# Patient Record
Sex: Female | Born: 1945 | Race: White | Hispanic: No | Marital: Married | Smoking: Current every day smoker
Health system: Southern US, Community
[De-identification: ages and names within clinical notes are randomized; demographics above are authoritative.]

## PROBLEM LIST (undated history)

## (undated) DIAGNOSIS — K589 Irritable bowel syndrome without diarrhea: Secondary | ICD-10-CM

## (undated) DIAGNOSIS — K635 Polyp of colon: Secondary | ICD-10-CM

## (undated) DIAGNOSIS — K279 Peptic ulcer, site unspecified, unspecified as acute or chronic, without hemorrhage or perforation: Secondary | ICD-10-CM

## (undated) DIAGNOSIS — K759 Inflammatory liver disease, unspecified: Secondary | ICD-10-CM

## (undated) DIAGNOSIS — Z972 Presence of dental prosthetic device (complete) (partial): Secondary | ICD-10-CM

## (undated) DIAGNOSIS — N184 Chronic kidney disease, stage 4 (severe): Secondary | ICD-10-CM

## (undated) DIAGNOSIS — I10 Essential (primary) hypertension: Secondary | ICD-10-CM

## (undated) DIAGNOSIS — K219 Gastro-esophageal reflux disease without esophagitis: Secondary | ICD-10-CM

## (undated) DIAGNOSIS — E119 Type 2 diabetes mellitus without complications: Secondary | ICD-10-CM

## (undated) DIAGNOSIS — J449 Chronic obstructive pulmonary disease, unspecified: Secondary | ICD-10-CM

## (undated) DIAGNOSIS — R42 Dizziness and giddiness: Secondary | ICD-10-CM

## (undated) DIAGNOSIS — R519 Headache, unspecified: Secondary | ICD-10-CM

## (undated) DIAGNOSIS — Z720 Tobacco use: Secondary | ICD-10-CM

## (undated) DIAGNOSIS — E785 Hyperlipidemia, unspecified: Secondary | ICD-10-CM

## (undated) DIAGNOSIS — R51 Headache: Secondary | ICD-10-CM

## (undated) HISTORY — PX: COLONOSCOPY: SHX174

## (undated) HISTORY — PX: ESOPHAGOGASTRODUODENOSCOPY: SHX1529

## (undated) HISTORY — PX: OTHER SURGICAL HISTORY: SHX169

## (undated) HISTORY — PX: ABDOMINAL HYSTERECTOMY: SHX81

---

## 2005-01-08 ENCOUNTER — Ambulatory Visit: Payer: Self-pay | Admitting: Family Medicine

## 2006-05-11 ENCOUNTER — Emergency Department: Payer: Self-pay | Admitting: Emergency Medicine

## 2006-05-11 ENCOUNTER — Other Ambulatory Visit: Payer: Self-pay

## 2006-05-13 ENCOUNTER — Ambulatory Visit: Payer: Self-pay | Admitting: Emergency Medicine

## 2006-05-19 ENCOUNTER — Ambulatory Visit: Payer: Self-pay | Admitting: Gastroenterology

## 2006-07-03 ENCOUNTER — Ambulatory Visit: Payer: Self-pay | Admitting: Gastroenterology

## 2006-07-16 ENCOUNTER — Ambulatory Visit: Payer: Self-pay | Admitting: Gastroenterology

## 2006-07-20 ENCOUNTER — Ambulatory Visit: Payer: Self-pay | Admitting: Gastroenterology

## 2007-06-16 ENCOUNTER — Ambulatory Visit: Payer: Self-pay | Admitting: Family Medicine

## 2008-03-22 ENCOUNTER — Ambulatory Visit: Payer: Self-pay | Admitting: Family Medicine

## 2008-06-20 ENCOUNTER — Ambulatory Visit: Payer: Self-pay | Admitting: Family Medicine

## 2009-09-13 ENCOUNTER — Ambulatory Visit: Payer: Self-pay | Admitting: Family Medicine

## 2010-04-22 ENCOUNTER — Emergency Department: Payer: Self-pay | Admitting: Emergency Medicine

## 2010-09-17 ENCOUNTER — Ambulatory Visit: Payer: Self-pay | Admitting: Family Medicine

## 2011-03-12 ENCOUNTER — Ambulatory Visit: Payer: Self-pay | Admitting: Internal Medicine

## 2011-04-06 ENCOUNTER — Ambulatory Visit: Payer: Self-pay | Admitting: Internal Medicine

## 2011-07-23 ENCOUNTER — Ambulatory Visit: Payer: Self-pay | Admitting: Internal Medicine

## 2011-11-20 ENCOUNTER — Ambulatory Visit: Payer: Self-pay | Admitting: Internal Medicine

## 2012-11-23 ENCOUNTER — Ambulatory Visit: Payer: Self-pay | Admitting: Internal Medicine

## 2012-12-14 ENCOUNTER — Ambulatory Visit: Payer: Self-pay | Admitting: Internal Medicine

## 2013-03-24 ENCOUNTER — Ambulatory Visit: Payer: Self-pay | Admitting: Internal Medicine

## 2013-06-21 DIAGNOSIS — R42 Dizziness and giddiness: Secondary | ICD-10-CM | POA: Insufficient documentation

## 2014-01-03 ENCOUNTER — Ambulatory Visit: Payer: Self-pay | Admitting: Family Medicine

## 2014-06-20 ENCOUNTER — Ambulatory Visit: Payer: Self-pay | Admitting: Gastroenterology

## 2014-06-22 LAB — PATHOLOGY REPORT

## 2015-01-05 ENCOUNTER — Ambulatory Visit
Admit: 2015-01-05 | Disposition: A | Discharge status: Home / Self Care | Payer: Self-pay | Attending: Family Medicine | Admitting: Family Medicine

## 2016-01-15 ENCOUNTER — Other Ambulatory Visit: Payer: Self-pay | Admitting: Family Medicine

## 2016-01-15 DIAGNOSIS — Z1231 Encounter for screening mammogram for malignant neoplasm of breast: Secondary | ICD-10-CM

## 2016-01-18 ENCOUNTER — Ambulatory Visit
Admission: RE | Admit: 2016-01-18 | Discharge: 2016-01-18 | Disposition: A | Discharge status: Home / Self Care | Payer: Medicare Other | Source: Ambulatory Visit | Attending: Family Medicine | Admitting: Family Medicine

## 2016-01-18 ENCOUNTER — Other Ambulatory Visit: Payer: Self-pay | Admitting: Family Medicine

## 2016-01-18 DIAGNOSIS — Z1231 Encounter for screening mammogram for malignant neoplasm of breast: Secondary | ICD-10-CM

## 2017-01-06 ENCOUNTER — Other Ambulatory Visit: Payer: Self-pay | Admitting: Family Medicine

## 2017-01-06 DIAGNOSIS — Z1231 Encounter for screening mammogram for malignant neoplasm of breast: Secondary | ICD-10-CM

## 2017-01-15 ENCOUNTER — Telehealth: Payer: Self-pay | Admitting: *Deleted

## 2017-01-15 DIAGNOSIS — Z87891 Personal history of nicotine dependence: Secondary | ICD-10-CM

## 2017-01-15 NOTE — Telephone Encounter (Signed)
Received referral for initial lung cancer screening scan. Contacted patient and obtained smoking history,(current, 33 pack year) as well as answering questions related to screening process. Patient denies signs of lung cancer such as weight loss or hemoptysis. Patient denies comorbidity that would prevent curative treatment if lung cancer were found. Patient is tentatively scheduled for shared decision making visit and CT scan on 01/20/17, pending insurance approval from business office.

## 2017-01-20 ENCOUNTER — Inpatient Hospital Stay: Payer: Medicare Other | Attending: Oncology | Admitting: Oncology

## 2017-01-20 ENCOUNTER — Ambulatory Visit
Admission: RE | Admit: 2017-01-20 | Discharge: 2017-01-20 | Disposition: A | Discharge status: Home / Self Care | Payer: Medicare Other | Source: Ambulatory Visit | Attending: Oncology | Admitting: Oncology

## 2017-01-20 DIAGNOSIS — Z87891 Personal history of nicotine dependence: Secondary | ICD-10-CM

## 2017-01-20 DIAGNOSIS — I2510 Atherosclerotic heart disease of native coronary artery without angina pectoris: Secondary | ICD-10-CM | POA: Insufficient documentation

## 2017-01-20 DIAGNOSIS — Z122 Encounter for screening for malignant neoplasm of respiratory organs: Secondary | ICD-10-CM

## 2017-01-20 DIAGNOSIS — I700 Atherosclerosis of aorta: Secondary | ICD-10-CM | POA: Insufficient documentation

## 2017-01-23 ENCOUNTER — Telehealth: Payer: Self-pay | Admitting: *Deleted

## 2017-01-23 NOTE — Telephone Encounter (Signed)
Notified patient of LDCT lung cancer screening results with recommendation for 12 month follow up imaging. Also notified of incidental finding noted below and encouraged to discuss with PCP who will be sent a copy of the report. Patient verbalizes understanding.   IMPRESSION: 1. Lung-RADS Category 2S, benign appearance or behavior. Continue annual screening with low-dose chest CT without contrast in 12 months. 2. The "S" modifier above refers to potentially clinically significant non lung cancer related findings. Specifically, there is aortic atherosclerosis, in addition to left main and 3 vessel coronary artery disease. Assessment for potential risk factor modification, dietary therapy or pharmacologic therapy may be warranted, if clinically indicated.

## 2017-01-25 DIAGNOSIS — Z87891 Personal history of nicotine dependence: Secondary | ICD-10-CM | POA: Insufficient documentation

## 2017-01-25 NOTE — Progress Notes (Signed)
In accordance with CMS guidelines, patient has met eligibility criteria including age, absence of signs or symptoms of lung cancer.  Social History  Substance Use Topics  . Smoking status: Not on file  . Smokeless tobacco: Not on file  . Alcohol use Not on file     A shared decision-making session was conducted prior to the performance of CT scan. This includes one or more decision aids, includes benefits and harms of screening, follow-up diagnostic testing, over-diagnosis, false positive rate, and total radiation exposure.  Counseling on the importance of adherence to annual lung cancer LDCT screening, impact of co-morbidities, and ability or willingness to undergo diagnosis and treatment is imperative for compliance of the program.  Counseling on the importance of continued smoking cessation for former smokers; the importance of smoking cessation for current smokers, and information about tobacco cessation interventions have been given to patient including [place] [facility] [facility] and 1800 quit [place] programs.  Written order for lung cancer screening with LDCT has been given to the patient and any and all questions have been answered to the best of my abilities.   Yearly follow up will be coordinated by Shawn Perkins, Thoracic Navigator.  

## 2017-01-28 ENCOUNTER — Ambulatory Visit
Admission: RE | Admit: 2017-01-28 | Discharge: 2017-01-28 | Disposition: A | Discharge status: Home / Self Care | Payer: Medicare Other | Source: Ambulatory Visit | Attending: Family Medicine | Admitting: Family Medicine

## 2017-01-28 DIAGNOSIS — Z1231 Encounter for screening mammogram for malignant neoplasm of breast: Secondary | ICD-10-CM | POA: Diagnosis not present

## 2017-10-29 ENCOUNTER — Other Ambulatory Visit
Admission: RE | Admit: 2017-10-29 | Discharge: 2017-10-29 | Disposition: A | Discharge status: Home / Self Care | Payer: Medicare Other | Source: Ambulatory Visit | Attending: Nephrology | Admitting: Nephrology

## 2017-10-29 DIAGNOSIS — N179 Acute kidney failure, unspecified: Secondary | ICD-10-CM | POA: Diagnosis present

## 2017-10-29 LAB — COMPREHENSIVE METABOLIC PANEL
ALBUMIN: 2.9 g/dL — AB (ref 3.5–5.0)
ALK PHOS: 60 U/L (ref 38–126)
ALT: 17 U/L (ref 14–54)
AST: 21 U/L (ref 15–41)
Anion gap: 9 (ref 5–15)
BILIRUBIN TOTAL: 0.3 mg/dL (ref 0.3–1.2)
BUN: 24 mg/dL — ABNORMAL HIGH (ref 6–20)
CALCIUM: 8.9 mg/dL (ref 8.9–10.3)
CO2: 26 mmol/L (ref 22–32)
CREATININE: 1.61 mg/dL — AB (ref 0.44–1.00)
Chloride: 106 mmol/L (ref 101–111)
GFR calc Af Amer: 36 mL/min — ABNORMAL LOW (ref >60)
GFR calc non Af Amer: 31 mL/min — ABNORMAL LOW (ref >60)
GLUCOSE: 131 mg/dL — AB (ref 65–99)
Potassium: 3.4 mmol/L — ABNORMAL LOW (ref 3.5–5.1)
SODIUM: 141 mmol/L (ref 135–145)
TOTAL PROTEIN: 6.6 g/dL (ref 6.5–8.1)

## 2017-10-29 LAB — CK: Total CK: 33 U/L — ABNORMAL LOW (ref 38–234)

## 2017-10-29 LAB — CBC
HCT: 28.7 % — ABNORMAL LOW (ref 35.0–47.0)
Hemoglobin: 10.1 g/dL — ABNORMAL LOW (ref 12.0–16.0)
MCH: 30.7 pg (ref 26.0–34.0)
MCHC: 35.2 g/dL (ref 32.0–36.0)
MCV: 87.1 fL (ref 80.0–100.0)
PLATELETS: 425 10*3/uL (ref 150–440)
RBC: 3.3 MIL/uL — ABNORMAL LOW (ref 3.80–5.20)
RDW: 12.8 % (ref 11.5–14.5)
WBC: 6.8 10*3/uL (ref 3.6–11.0)

## 2017-10-29 LAB — PHOSPHORUS: PHOSPHORUS: 4.7 mg/dL — AB (ref 2.5–4.6)

## 2017-11-05 ENCOUNTER — Ambulatory Visit
Admission: RE | Admit: 2017-11-05 | Discharge: 2017-11-05 | Disposition: A | Discharge status: Home / Self Care | Payer: Medicare Other | Source: Ambulatory Visit | Attending: Gastroenterology | Admitting: Gastroenterology

## 2017-11-05 ENCOUNTER — Encounter: Payer: Self-pay | Admitting: Anesthesiology

## 2017-11-05 ENCOUNTER — Ambulatory Visit: Payer: Medicare Other | Admitting: Anesthesiology

## 2017-11-05 ENCOUNTER — Encounter
Admission: RE | Disposition: A | Discharge status: Home / Self Care | Payer: Self-pay | Source: Ambulatory Visit | Attending: Gastroenterology

## 2017-11-05 DIAGNOSIS — R634 Abnormal weight loss: Secondary | ICD-10-CM | POA: Diagnosis not present

## 2017-11-05 DIAGNOSIS — K589 Irritable bowel syndrome without diarrhea: Secondary | ICD-10-CM | POA: Diagnosis not present

## 2017-11-05 DIAGNOSIS — E785 Hyperlipidemia, unspecified: Secondary | ICD-10-CM | POA: Insufficient documentation

## 2017-11-05 DIAGNOSIS — K3189 Other diseases of stomach and duodenum: Secondary | ICD-10-CM | POA: Insufficient documentation

## 2017-11-05 DIAGNOSIS — Z8711 Personal history of peptic ulcer disease: Secondary | ICD-10-CM | POA: Diagnosis not present

## 2017-11-05 DIAGNOSIS — E119 Type 2 diabetes mellitus without complications: Secondary | ICD-10-CM | POA: Insufficient documentation

## 2017-11-05 DIAGNOSIS — Z7982 Long term (current) use of aspirin: Secondary | ICD-10-CM | POA: Diagnosis not present

## 2017-11-05 DIAGNOSIS — Q438 Other specified congenital malformations of intestine: Secondary | ICD-10-CM | POA: Insufficient documentation

## 2017-11-05 DIAGNOSIS — I10 Essential (primary) hypertension: Secondary | ICD-10-CM | POA: Insufficient documentation

## 2017-11-05 DIAGNOSIS — D122 Benign neoplasm of ascending colon: Secondary | ICD-10-CM | POA: Diagnosis not present

## 2017-11-05 DIAGNOSIS — J449 Chronic obstructive pulmonary disease, unspecified: Secondary | ICD-10-CM | POA: Diagnosis not present

## 2017-11-05 DIAGNOSIS — R1084 Generalized abdominal pain: Secondary | ICD-10-CM | POA: Diagnosis present

## 2017-11-05 DIAGNOSIS — K219 Gastro-esophageal reflux disease without esophagitis: Secondary | ICD-10-CM | POA: Insufficient documentation

## 2017-11-05 DIAGNOSIS — K210 Gastro-esophageal reflux disease with esophagitis: Secondary | ICD-10-CM | POA: Insufficient documentation

## 2017-11-05 DIAGNOSIS — Z79899 Other long term (current) drug therapy: Secondary | ICD-10-CM | POA: Insufficient documentation

## 2017-11-05 DIAGNOSIS — Z86010 Personal history of colonic polyps: Secondary | ICD-10-CM | POA: Insufficient documentation

## 2017-11-05 DIAGNOSIS — Z880 Allergy status to penicillin: Secondary | ICD-10-CM | POA: Diagnosis not present

## 2017-11-05 DIAGNOSIS — Z882 Allergy status to sulfonamides status: Secondary | ICD-10-CM | POA: Diagnosis not present

## 2017-11-05 DIAGNOSIS — K2950 Unspecified chronic gastritis without bleeding: Secondary | ICD-10-CM | POA: Diagnosis not present

## 2017-11-05 HISTORY — DX: Type 2 diabetes mellitus without complications: E11.9

## 2017-11-05 HISTORY — DX: Headache: R51

## 2017-11-05 HISTORY — DX: Headache, unspecified: R51.9

## 2017-11-05 HISTORY — DX: Hyperlipidemia, unspecified: E78.5

## 2017-11-05 HISTORY — DX: Polyp of colon: K63.5

## 2017-11-05 HISTORY — DX: Irritable bowel syndrome, unspecified: K58.9

## 2017-11-05 HISTORY — PX: ESOPHAGOGASTRODUODENOSCOPY (EGD) WITH PROPOFOL: SHX5813

## 2017-11-05 HISTORY — DX: Chronic obstructive pulmonary disease, unspecified: J44.9

## 2017-11-05 HISTORY — DX: Essential (primary) hypertension: I10

## 2017-11-05 HISTORY — DX: Peptic ulcer, site unspecified, unspecified as acute or chronic, without hemorrhage or perforation: K27.9

## 2017-11-05 HISTORY — PX: COLONOSCOPY WITH PROPOFOL: SHX5780

## 2017-11-05 HISTORY — DX: Tobacco use: Z72.0

## 2017-11-05 LAB — KOH PREP

## 2017-11-05 LAB — GLUCOSE, CAPILLARY: Glucose-Capillary: 123 mg/dL — ABNORMAL HIGH (ref 65–99)

## 2017-11-05 SURGERY — COLONOSCOPY WITH PROPOFOL
Anesthesia: General

## 2017-11-05 MED ORDER — LIDOCAINE HCL (PF) 2 % IJ SOLN
INTRAMUSCULAR | Status: AC
  Filled 2017-11-05: qty 10

## 2017-11-05 MED ORDER — FENTANYL CITRATE (PF) 100 MCG/2ML IJ SOLN
INTRAMUSCULAR | Status: DC | PRN
  Administered 2017-11-05: 50 ug via INTRAVENOUS

## 2017-11-05 MED ORDER — EPHEDRINE SULFATE 50 MG/ML IJ SOLN
INTRAMUSCULAR | Status: DC | PRN
  Administered 2017-11-05 (×2): 5 mg via INTRAVENOUS

## 2017-11-05 MED ORDER — MIDAZOLAM HCL 2 MG/2ML IJ SOLN
INTRAMUSCULAR | Status: AC
  Filled 2017-11-05: qty 2

## 2017-11-05 MED ORDER — LIDOCAINE HCL (PF) 1 % IJ SOLN
INTRAMUSCULAR | Status: AC
  Administered 2017-11-05: 0.3 mL via INTRADERMAL
  Filled 2017-11-05: qty 2

## 2017-11-05 MED ORDER — PROPOFOL 500 MG/50ML IV EMUL
INTRAVENOUS | Status: DC | PRN
  Administered 2017-11-05: 100 ug/kg/min via INTRAVENOUS

## 2017-11-05 MED ORDER — SODIUM CHLORIDE 0.9 % IV SOLN: INTRAVENOUS | Status: DC

## 2017-11-05 MED ORDER — FENTANYL CITRATE (PF) 100 MCG/2ML IJ SOLN
INTRAMUSCULAR | Status: AC
  Filled 2017-11-05: qty 2

## 2017-11-05 MED ORDER — LIDOCAINE HCL (PF) 1 % IJ SOLN
2.0000 mL | Freq: Once | INTRAMUSCULAR | Status: AC
  Administered 2017-11-05: 0.3 mL via INTRADERMAL

## 2017-11-05 MED ORDER — PROPOFOL 500 MG/50ML IV EMUL
INTRAVENOUS | Status: AC
  Filled 2017-11-05: qty 50

## 2017-11-05 MED ORDER — LIDOCAINE HCL (CARDIAC) 20 MG/ML IV SOLN
INTRAVENOUS | Status: DC | PRN
  Administered 2017-11-05: 30 mg via INTRAVENOUS

## 2017-11-05 MED ORDER — SODIUM CHLORIDE 0.9 % IV SOLN
INTRAVENOUS | Status: DC
  Administered 2017-11-05: 1000 mL via INTRAVENOUS

## 2017-11-05 MED ORDER — PROPOFOL 10 MG/ML IV BOLUS
INTRAVENOUS | Status: AC
  Filled 2017-11-05: qty 20

## 2017-11-05 MED ORDER — MIDAZOLAM HCL 2 MG/2ML IJ SOLN
INTRAMUSCULAR | Status: DC | PRN
  Administered 2017-11-05: 1 mg via INTRAVENOUS

## 2017-11-05 NOTE — Anesthesia Procedure Notes (Signed)
Performed by: Cook-Martin, Janely Gullickson Pre-anesthesia Checklist: Patient identified, Emergency Drugs available, Suction available, Patient being monitored and Timeout performed Patient Re-evaluated:Patient Re-evaluated prior to induction Oxygen Delivery Method: Nasal cannula Preoxygenation: Pre-oxygenation with 100% oxygen Induction Type: IV induction Airway Equipment and Method: Bite block Placement Confirmation: positive ETCO2 and CO2 detector       

## 2017-11-05 NOTE — Anesthesia Postprocedure Evaluation (Signed)
Anesthesia Post Note  Patient: Donna Brennan  Procedure(s) Performed: COLONOSCOPY WITH PROPOFOL (N/A ) ESOPHAGOGASTRODUODENOSCOPY (EGD) WITH PROPOFOL (N/A )  Patient location during evaluation: Endoscopy Anesthesia Type: General Level of consciousness: awake and alert Pain management: pain level controlled Vital Signs Assessment: post-procedure vital signs reviewed and stable Respiratory status: spontaneous breathing, nonlabored ventilation, respiratory function stable and patient connected to nasal cannula oxygen Cardiovascular status: blood pressure returned to baseline and stable Postop Assessment: no apparent nausea or vomiting Anesthetic complications: no     Last Vitals:  Vitals:   11/05/17 1150 11/05/17 1200  BP: 129/63 111/60  Pulse: 83 73  Resp: 19 14  Temp:    SpO2: 97% 100%    Last Pain:  Vitals:   11/05/17 1130  TempSrc: Tympanic                 Martha Clan

## 2017-11-05 NOTE — Op Note (Addendum)
[facility] Gastroenterology Patient Name: Donna Brennan Procedure Date: 11/05/2017 10:24 AM MRN: [id number] Account #: [id number] Date of Birth: 02-26-1946 Admit Type: Outpatient Age: 72 Room: [facility] ENDO ROOM 1 Gender: Female Note Status: Finalized Procedure:            Upper GI endoscopy Indications:          Epigastric abdominal pain, Abdominal pain in the right                        upper quadrant, Weight loss Providers:            Lollie Sails, MD Referring MD:         Gayland Curry MD, MD (Referring MD) Medicines:            Monitored Anesthesia Care Complications:        No immediate complications. Procedure:            Pre-Anesthesia Assessment:                       - ASA Grade Assessment: III - A patient with severe                        systemic disease.                       After obtaining informed consent, the endoscope was                        passed under direct vision. Throughout the procedure,                        the patient's blood pressure, pulse, and oxygen                        saturations were monitored continuously. The                        Colonoscope was introduced through the mouth, and                        advanced to the third part of duodenum. The upper GI                        endoscopy was accomplished without difficulty. The                        patient tolerated the procedure well. Findings:      The examined esophagus was normal.      patulous GE junction      The Z-line was regular. Biopsies were taken with a cold forceps for       histology.      The entire examined stomach was normal. Biopsies were taken with a cold       forceps for histology.      The cardia and gastric fundus were normal on retroflexion.      Diffuse mild mucosal variance characterized by smoothness was found in       the entire duodenum. Biopsies were taken with a cold forceps for       histology.      Patchy candidiasis was  found in the upper third  of the esophagus and in       the middle third of the esophagus. Cells for cytology were obtained by       brushing. Impression:           - Normal esophagus.                       - Z-line regular. Biopsied.                       - Normal stomach. Biopsied.                       - Mucosal variant in the duodenum. Biopsied. Recommendation:       - Discharge patient to home.                       - Advance diet as tolerated.                       - Await pathology results.                       - Perform a HIDA/CCK (hepatobiliary iminodiacetic acid)                        scan at appointment to be scheduled.                       - Perform a RUQ ultrasound at appointment to be                        scheduled. Procedure Code(s):    --- Professional ---                       [id number], Esophagogastroduodenoscopy, flexible, transoral;                        with biopsy, single or multiple Diagnosis Code(s):    --- Professional ---                       K31.89, Other diseases of stomach and duodenum                       R10.13, Epigastric pain                       R10.11, Right upper quadrant pain                       R63.4, Abnormal weight loss CPT copyright 2016 American Medical Association. All rights reserved. The codes documented in this report are preliminary and upon coder review may  be revised to meet current compliance requirements. Lollie Sails, MD 11/05/2017 10:57:55 AM This report has been signed electronically. Number of Addenda: 0 Note Initiated On: 11/05/2017 10:24 AM      [facility]

## 2017-11-05 NOTE — Op Note (Signed)
[facility] Gastroenterology Patient Name: Donna Brennan Procedure Date: 11/05/2017 10:23 AM MRN: [id number] Account #: [id number] Date of Birth: May 30, 1946 Admit Type: Outpatient Age: 72 Room: [facility] ENDO ROOM 1 Gender: Female Note Status: Finalized Procedure:            Colonoscopy Indications:          Epigastric abdominal pain, Generalized abdominal pain,                        Abdominal pain in the right upper quadrant Providers:            Lollie Sails, MD Referring MD:         Gayland Curry MD, MD (Referring MD) Medicines:            Monitored Anesthesia Care Complications:        No immediate complications. Procedure:            Pre-Anesthesia Assessment:                       - ASA Grade Assessment: III - A patient with severe                        systemic disease.                       After obtaining informed consent, the colonoscope was                        passed under direct vision. Throughout the procedure,                        the patient's blood pressure, pulse, and oxygen                        saturations were monitored continuously. The                        Colonoscope was introduced through the anus and                        advanced to the the cecum, identified by appendiceal                        orifice and ileocecal valve. The colonoscopy was                        performed with moderate difficulty due to significant                        looping. Successful completion of the procedure was                        aided by changing the patient to a supine position,                        changing the patient to a prone position and using                        manual pressure. The patient tolerated the procedure  well. The quality of the bowel preparation was good. Findings:      A 3 mm polyp was found in the proximal ascending colon. The polyp was       sessile. The polyp was removed with a cold  biopsy forceps. Resection and       retrieval were complete.      Biopsies for histology were taken with a cold forceps from the       transverse colon, descending colon and sigmoid colon for evaluation of       microscopic colitis.      The digital rectal exam was normal. Impression:           - One 3 mm polyp in the proximal ascending colon,                        removed with a cold biopsy forceps. Resected and                        retrieved.                       - Biopsies were taken with a cold forceps from the                        transverse colon, descending colon and sigmoid colon                        for evaluation of microscopic colitis. Recommendation:       - Discharge patient to home. Procedure Code(s):    --- Professional ---                       [phone], Colonoscopy, flexible; with biopsy, single or                        multiple Diagnosis Code(s):    --- Professional ---                       D12.2, Benign neoplasm of ascending colon                       R10.13, Epigastric pain                       R10.84, Generalized abdominal pain                       R10.11, Right upper quadrant pain CPT copyright 2016 American Medical Association. All rights reserved. The codes documented in this report are preliminary and upon coder review may  be revised to meet current compliance requirements. Lollie Sails, MD 11/05/2017 11:33:13 AM This report has been signed electronically. Number of Addenda: 0 Note Initiated On: 11/05/2017 10:23 AM Scope Withdrawal Time: 0 hours 6 minutes 36 seconds  Total Procedure Duration: 0 hours 29 minutes 17 seconds       [facility]

## 2017-11-05 NOTE — Anesthesia Post-op Follow-up Note (Signed)
Anesthesia QCDR form completed.        

## 2017-11-05 NOTE — Transfer of Care (Signed)
Immediate Anesthesia Transfer of Care Note  Patient: Donna Brennan  Procedure(s) Performed: COLONOSCOPY WITH PROPOFOL (N/A ) ESOPHAGOGASTRODUODENOSCOPY (EGD) WITH PROPOFOL (N/A )  Patient Location: PACU  Anesthesia Type:General  Level of Consciousness: awake and sedated  Airway & Oxygen Therapy: Patient Spontanous Breathing and Patient connected to nasal cannula oxygen  Post-op Assessment: Report given to RN and Post -op Vital signs reviewed and stable  Post vital signs: Reviewed and stable  Last Vitals:  Vitals:   11/05/17 0936  BP: (!) 147/67  Pulse: 75  Resp: 18  Temp: 36.6 C  SpO2: 100%    Last Pain:  Vitals:   11/05/17 0936  TempSrc: Tympanic         Complications: No apparent anesthesia complications

## 2017-11-05 NOTE — H&P (Signed)
Outpatient short stay form Pre-procedure 11/05/2017 10:28 AM Donna Sails MD  Primary Physician: Gayland Curry, MD  Reason for visit: EGD and colonoscopy  History of present illness: Patient is a 72 year old female presenting today as above.  She has had issues with poor appetite and weight loss since this past November.  She has not seen any blood in the stool or black bowel movements.  There is been no dysphagia or odynophagia.  There has been issues with epigastric pain and nausea but no vomiting.  Does have a personal history of adenomatous colon polyps.  He is presenting today as above.  Tolerated prep well.  She takes 81 mg aspirin daily but no other aspirin products or blood thinning agents.    Current Facility-Administered Medications:  .  0.9 %  sodium chloride infusion, , Intravenous, Continuous, Donna Sails, MD, Last Rate: 20 mL/hr at 11/05/17 0951, 1,000 mL at 11/05/17 0951 .  0.9 %  sodium chloride infusion, , Intravenous, Continuous, Donna Sails, MD  Facility-Administered Medications Ordered in Other Encounters:  .  lidocaine (cardiac) 100 mg/49ml (XYLOCAINE) 20 MG/ML injection 2%, , , Anesthesia Intra-op, Cook-Martin, Cindy, 30 mg at 11/05/17 1026  Medications Prior to Admission  Medication Sig Dispense Refill Last Dose  . albuterol (PROVENTIL HFA;VENTOLIN HFA) 108 (90 Base) MCG/ACT inhaler Inhale into the lungs every 6 (six) hours as needed for wheezing or shortness of breath.     Marland Kitchen aspirin EC 81 MG tablet Take 81 mg by mouth daily.   10/30/2017  . ezetimibe (ZETIA) 10 MG tablet Take 10 mg by mouth daily.     . Fluticasone-Salmeterol (ADVAIR) 250-50 MCG/DOSE AEPB Inhale 1 puff into the lungs 2 (two) times daily.     . furosemide (LASIX) 20 MG tablet Take 20 mg by mouth.     Marland Kitchen ibuprofen (ADVIL,MOTRIN) 200 MG tablet Take 200 mg by mouth every 6 (six) hours as needed.     . insulin glargine (LANTUS) 100 UNIT/ML injection Inject 20 Units into the skin at  bedtime.     . Ipratropium-Albuterol (COMBIVENT) 20-100 MCG/ACT AERS respimat Inhale 1 puff into the lungs every 4 (four) hours as needed for wheezing.     Marland Kitchen lisinopril (PRINIVIL,ZESTRIL) 5 MG tablet Take 5 mg by mouth daily.     . meloxicam (MOBIC) 7.5 MG tablet Take 7.5 mg by mouth daily.     . metFORMIN (GLUCOPHAGE-XR) 500 MG 24 hr tablet Take 500 mg by mouth 3 (three) times daily.     . Multiple Vitamin (MULTIVITAMIN) tablet Take 1 tablet by mouth daily.     . pantoprazole (PROTONIX) 40 MG tablet Take 40 mg by mouth 2 (two) times daily before a meal.     . pramoxine-hydrocortisone (ANALPRAM HC) cream Apply topically 3 (three) times daily.     . rosuvastatin (CRESTOR) 40 MG tablet Take 40 mg by mouth daily.     Marland Kitchen tiotropium (SPIRIVA) 18 MCG inhalation capsule Place 18 mcg into inhaler and inhale daily.        Allergies  Allergen Reactions  . Penicillins   . Sulfa Antibiotics      Past Medical History:  Diagnosis Date  . Colon polyps   . COPD (chronic obstructive pulmonary disease) ([place])   . Diabetes mellitus without complication ([place])   . Headache   . Hyperlipidemia   . Hypertension   . IBS (irritable bowel syndrome)   . Peptic ulcer   . Tobacco abuse  Review of systems:      Physical Exam    Heart and lungs: Regular rate and rhythm without rub or gallop, lungs are bilaterally clear.    HEENT: Normocephalic atraumatic eyes are anicteric    Other:    Pertinant exam for procedure: Soft tender to palpation throughout the abdomen more so noted in the epigastric and right upper quadrant region.  No apparent masses or rebound.  Bowel sounds are positive and normoactive.    Planned proceedures: EGD, colonoscopy and indicated procedures. I have discussed the risks benefits and complications of procedures to include not limited to bleeding, infection, perforation and the risk of sedation and the patient wishes to proceed.    Donna Sails,  MD Gastroenterology 11/05/2017  10:28 AM

## 2017-11-05 NOTE — Anesthesia Preprocedure Evaluation (Signed)
Anesthesia Evaluation  Patient identified by MRN, date of birth, ID band Patient awake    Reviewed: Allergy & Precautions, H&P , NPO status , Patient's Chart, lab work & pertinent test results, reviewed documented beta blocker date and time   History of Anesthesia Complications Negative for: history of anesthetic complications  Airway Mallampati: I  TM Distance: >3 FB Neck ROM: full    Dental  (+) Partial Upper, Partial Lower   Pulmonary shortness of breath and with exertion, neg sleep apnea, COPD,  COPD inhaler, neg recent URI, Current Smoker,           Cardiovascular Exercise Tolerance: Good hypertension, (-) angina(-) CAD, (-) Past MI, (-) Cardiac Stents and (-) CABG (-) dysrhythmias (-) Valvular Problems/Murmurs     Neuro/Psych negative neurological ROS  negative psych ROS   GI/Hepatic Neg liver ROS, PUD, GERD  ,  Endo/Other  diabetes  Renal/GU negative Renal ROS  negative genitourinary   Musculoskeletal   Abdominal   Peds  Hematology negative hematology ROS (+)   Anesthesia Other Findings Past Medical History: No date: Colon polyps No date: COPD (chronic obstructive pulmonary disease) (HCC) No date: Diabetes mellitus without complication (HCC) No date: Headache No date: Hyperlipidemia No date: Hypertension No date: IBS (irritable bowel syndrome) No date: Peptic ulcer No date: Tobacco abuse   Reproductive/Obstetrics negative OB ROS                             Anesthesia Physical Anesthesia Plan  ASA: III  Anesthesia Plan: General   Post-op Pain Management:    Induction: Intravenous  PONV Risk Score and Plan: 2 and Propofol infusion  Airway Management Planned: Nasal Cannula  Additional Equipment:   Intra-op Plan:   Post-operative Plan:   Informed Consent: I have reviewed the patients History and Physical, chart, labs and discussed the procedure including the risks,  benefits and alternatives for the proposed anesthesia with the patient or authorized representative who has indicated his/her understanding and acceptance.   Dental Advisory Given  Plan Discussed with: Anesthesiologist, CRNA and Surgeon  Anesthesia Plan Comments:         Anesthesia Quick Evaluation

## 2017-11-09 ENCOUNTER — Other Ambulatory Visit: Payer: Self-pay | Admitting: Gastroenterology

## 2017-11-09 DIAGNOSIS — D649 Anemia, unspecified: Secondary | ICD-10-CM

## 2017-11-09 DIAGNOSIS — R1013 Epigastric pain: Secondary | ICD-10-CM

## 2017-11-09 DIAGNOSIS — R1011 Right upper quadrant pain: Secondary | ICD-10-CM

## 2017-11-09 LAB — SURGICAL PATHOLOGY

## 2017-11-18 ENCOUNTER — Encounter
Admission: RE | Admit: 2017-11-18 | Discharge: 2017-11-18 | Disposition: A | Discharge status: Home / Self Care | Payer: Medicare Other | Source: Ambulatory Visit | Attending: Gastroenterology | Admitting: Gastroenterology

## 2017-11-18 ENCOUNTER — Ambulatory Visit
Admission: RE | Admit: 2017-11-18 | Discharge: 2017-11-18 | Disposition: A | Discharge status: Home / Self Care | Payer: Medicare Other | Source: Ambulatory Visit | Attending: Gastroenterology | Admitting: Gastroenterology

## 2017-11-18 DIAGNOSIS — R1013 Epigastric pain: Secondary | ICD-10-CM | POA: Insufficient documentation

## 2017-11-18 DIAGNOSIS — R1011 Right upper quadrant pain: Secondary | ICD-10-CM | POA: Insufficient documentation

## 2017-11-18 DIAGNOSIS — K7689 Other specified diseases of liver: Secondary | ICD-10-CM | POA: Diagnosis not present

## 2017-11-18 DIAGNOSIS — D649 Anemia, unspecified: Secondary | ICD-10-CM | POA: Diagnosis not present

## 2017-11-18 MED ORDER — TECHNETIUM TC 99M MEBROFENIN IV KIT
5.4410 | PACK | Freq: Once | INTRAVENOUS | Status: AC | PRN
  Administered 2017-11-18: 5.441 via INTRAVENOUS

## 2017-11-20 ENCOUNTER — Other Ambulatory Visit: Payer: Self-pay | Admitting: Specialist

## 2017-11-20 DIAGNOSIS — R918 Other nonspecific abnormal finding of lung field: Secondary | ICD-10-CM

## 2017-12-23 ENCOUNTER — Other Ambulatory Visit: Payer: Self-pay | Admitting: Family Medicine

## 2017-12-23 DIAGNOSIS — Z1231 Encounter for screening mammogram for malignant neoplasm of breast: Secondary | ICD-10-CM

## 2017-12-30 ENCOUNTER — Other Ambulatory Visit: Payer: Self-pay | Admitting: Gastroenterology

## 2017-12-30 DIAGNOSIS — Z8639 Personal history of other endocrine, nutritional and metabolic disease: Secondary | ICD-10-CM

## 2017-12-30 DIAGNOSIS — R140 Abdominal distension (gaseous): Secondary | ICD-10-CM

## 2018-01-06 DIAGNOSIS — E538 Deficiency of other specified B group vitamins: Secondary | ICD-10-CM | POA: Insufficient documentation

## 2018-01-09 ENCOUNTER — Ambulatory Visit: Payer: Medicare Other

## 2018-01-15 ENCOUNTER — Telehealth: Payer: Self-pay | Admitting: *Deleted

## 2018-01-15 ENCOUNTER — Encounter
Admission: RE | Admit: 2018-01-15 | Discharge: 2018-01-15 | Disposition: A | Discharge status: Home / Self Care | Payer: Medicare Other | Source: Ambulatory Visit | Attending: Gastroenterology | Admitting: Gastroenterology

## 2018-01-15 DIAGNOSIS — R1010 Upper abdominal pain, unspecified: Secondary | ICD-10-CM | POA: Diagnosis present

## 2018-01-15 DIAGNOSIS — Z122 Encounter for screening for malignant neoplasm of respiratory organs: Secondary | ICD-10-CM

## 2018-01-15 DIAGNOSIS — Z87891 Personal history of nicotine dependence: Secondary | ICD-10-CM

## 2018-01-15 DIAGNOSIS — R140 Abdominal distension (gaseous): Secondary | ICD-10-CM

## 2018-01-15 DIAGNOSIS — Z8639 Personal history of other endocrine, nutritional and metabolic disease: Secondary | ICD-10-CM

## 2018-01-15 MED ORDER — TECHNETIUM TC 99M SULFUR COLLOID
2.5000 | Freq: Once | INTRAVENOUS | Status: AC | PRN
  Administered 2018-01-15: 2.5 via INTRAVENOUS

## 2018-01-15 NOTE — Telephone Encounter (Signed)
Left message for patient to notify them that it is time to schedule annual low dose lung cancer screening CT scan. Instructed patient to call back to verify information prior to the scan being scheduled.  

## 2018-01-15 NOTE — Telephone Encounter (Signed)
Notified patient that annual lung cancer screening low dose CT scan is due currently or will be in near future. Confirmed that patient is within the age range of 55-77, and asymptomatic, (no signs or symptoms of lung cancer). Patient denies illness that would prevent curative treatment for lung cancer if found. Verified smoking history, (current, 33.25 pack year). The shared decision making visit was done 01/20/17. Patient is agreeable for CT scan being scheduled.

## 2018-01-20 ENCOUNTER — Other Ambulatory Visit: Payer: Self-pay | Admitting: Gastroenterology

## 2018-01-20 DIAGNOSIS — R1011 Right upper quadrant pain: Secondary | ICD-10-CM

## 2018-01-21 ENCOUNTER — Ambulatory Visit
Admission: RE | Admit: 2018-01-21 | Discharge: 2018-01-21 | Disposition: A | Discharge status: Home / Self Care | Payer: Medicare Other | Source: Ambulatory Visit | Attending: Nurse Practitioner | Admitting: Nurse Practitioner

## 2018-01-21 DIAGNOSIS — Z122 Encounter for screening for malignant neoplasm of respiratory organs: Secondary | ICD-10-CM | POA: Diagnosis present

## 2018-01-21 DIAGNOSIS — J439 Emphysema, unspecified: Secondary | ICD-10-CM | POA: Diagnosis not present

## 2018-01-21 DIAGNOSIS — I700 Atherosclerosis of aorta: Secondary | ICD-10-CM | POA: Diagnosis not present

## 2018-01-21 DIAGNOSIS — Z87891 Personal history of nicotine dependence: Secondary | ICD-10-CM | POA: Diagnosis present

## 2018-01-21 DIAGNOSIS — I2510 Atherosclerotic heart disease of native coronary artery without angina pectoris: Secondary | ICD-10-CM | POA: Diagnosis not present

## 2018-01-25 ENCOUNTER — Encounter: Payer: Self-pay | Admitting: *Deleted

## 2018-01-26 ENCOUNTER — Ambulatory Visit
Admission: RE | Admit: 2018-01-26 | Discharge: 2018-01-26 | Disposition: A | Discharge status: Home / Self Care | Payer: Medicare Other | Source: Ambulatory Visit | Attending: Gastroenterology | Admitting: Gastroenterology

## 2018-01-26 DIAGNOSIS — R1011 Right upper quadrant pain: Secondary | ICD-10-CM

## 2018-01-26 DIAGNOSIS — K769 Liver disease, unspecified: Secondary | ICD-10-CM | POA: Insufficient documentation

## 2018-01-26 DIAGNOSIS — R1010 Upper abdominal pain, unspecified: Secondary | ICD-10-CM | POA: Diagnosis present

## 2018-01-26 MED ORDER — IOHEXOL 300 MG/ML  SOLN
75.0000 mL | Freq: Once | INTRAMUSCULAR | Status: AC | PRN
  Administered 2018-01-26: 75 mL via INTRAVENOUS

## 2018-02-01 ENCOUNTER — Ambulatory Visit
Admission: RE | Admit: 2018-02-01 | Discharge: 2018-02-01 | Disposition: A | Discharge status: Home / Self Care | Payer: Medicare Other | Source: Ambulatory Visit | Attending: Family Medicine | Admitting: Family Medicine

## 2018-02-01 DIAGNOSIS — Z1231 Encounter for screening mammogram for malignant neoplasm of breast: Secondary | ICD-10-CM | POA: Diagnosis present

## 2018-09-15 DIAGNOSIS — I700 Atherosclerosis of aorta: Secondary | ICD-10-CM | POA: Insufficient documentation

## 2018-09-15 DIAGNOSIS — E278 Other specified disorders of adrenal gland: Secondary | ICD-10-CM | POA: Insufficient documentation

## 2018-09-27 ENCOUNTER — Encounter (INDEPENDENT_AMBULATORY_CARE_PROVIDER_SITE_OTHER): Payer: Self-pay

## 2018-09-27 ENCOUNTER — Encounter: Payer: Self-pay | Admitting: *Deleted

## 2018-09-27 ENCOUNTER — Telehealth: Payer: Self-pay | Admitting: Internal Medicine

## 2018-09-27 ENCOUNTER — Ambulatory Visit
Admission: RE | Admit: 2018-09-27 | Discharge: 2018-09-27 | Disposition: A | Discharge status: Home / Self Care | Payer: Medicare Other | Attending: Internal Medicine | Admitting: Internal Medicine

## 2018-09-27 ENCOUNTER — Inpatient Hospital Stay: Payer: Medicare Other | Attending: Internal Medicine | Admitting: Internal Medicine

## 2018-09-27 ENCOUNTER — Encounter: Payer: Self-pay | Admitting: Internal Medicine

## 2018-09-27 ENCOUNTER — Ambulatory Visit
Admission: RE | Admit: 2018-09-27 | Discharge: 2018-09-27 | Disposition: A | Discharge status: Home / Self Care | Payer: Medicare Other | Source: Ambulatory Visit | Attending: Internal Medicine | Admitting: Internal Medicine

## 2018-09-27 DIAGNOSIS — D472 Monoclonal gammopathy: Secondary | ICD-10-CM | POA: Insufficient documentation

## 2018-09-27 DIAGNOSIS — Z720 Tobacco use: Secondary | ICD-10-CM | POA: Diagnosis not present

## 2018-09-27 DIAGNOSIS — R634 Abnormal weight loss: Secondary | ICD-10-CM | POA: Insufficient documentation

## 2018-09-27 DIAGNOSIS — E119 Type 2 diabetes mellitus without complications: Secondary | ICD-10-CM | POA: Insufficient documentation

## 2018-09-27 DIAGNOSIS — G8929 Other chronic pain: Secondary | ICD-10-CM | POA: Diagnosis not present

## 2018-09-27 DIAGNOSIS — D649 Anemia, unspecified: Secondary | ICD-10-CM | POA: Insufficient documentation

## 2018-09-27 DIAGNOSIS — C9000 Multiple myeloma not having achieved remission: Secondary | ICD-10-CM | POA: Insufficient documentation

## 2018-09-27 DIAGNOSIS — M545 Low back pain: Secondary | ICD-10-CM | POA: Diagnosis not present

## 2018-09-27 DIAGNOSIS — J449 Chronic obstructive pulmonary disease, unspecified: Secondary | ICD-10-CM | POA: Diagnosis not present

## 2018-09-27 DIAGNOSIS — Z794 Long term (current) use of insulin: Secondary | ICD-10-CM | POA: Insufficient documentation

## 2018-09-27 DIAGNOSIS — N289 Disorder of kidney and ureter, unspecified: Secondary | ICD-10-CM | POA: Diagnosis not present

## 2018-09-27 NOTE — Progress Notes (Signed)
[place] NOTE  Patient Care Team: Adin Hector, MD as PCP - General (Internal Medicine)  CHIEF COMPLAINTS/PURPOSE OF CONSULTATION:  Abnormal monoclonal work-up  #December 2019 [PCP]-M protein serum negative; urine/kappa lambda light chain ratio-slightly abnormal [mild anemia hemoglobin 11; normal iron studies chronic kidney disease creatinine 1.2]  # DM- on insulin-good control; COPD/active smoker; status post colonoscopy Dr. Gustavo Lah; mammogram-2019 normal  No history exists.     HISTORY OF PRESENTING ILLNESS:  Donna Brennan 72 y.o.  female with a longstanding history of smoking/active smoker has been referred to [place] for further evaluation and recommendations for abnormal monoclonal work-up.  Patient states approximately in January 2018-she was noted to have acute renal insufficiency with a creatinine up to 3.5; for which she was evaluated by Dr. Candiss Norse.  As per the daughter patient was taken off her ACE inhibitor which seems to have improved her kidney function.  However recently when she was restarted back on ACE inhibitor her renal function worsened-and also noted to have worsening clinical symptoms of fatigue/back hurting abdominal discomfort.  She is currently off lisinopril.  Recent labs at PCPs office-hemoglobin 11.2/white count platelets normal; iron studies normal; slightly abnormal urine/serum kappa lambda light chain ratio.  Creatinine slightly elevated 1.2; normal calcium.  Of note patient had lost about 10 pounds in the last few years.  She denies any tingling or numbness.  No nausea no vomiting.  But feels poorly.  Review of Systems  Constitutional: Positive for malaise/fatigue and weight loss. Negative for chills, diaphoresis and fever.  HENT: Negative for nosebleeds and sore throat.   Eyes: Negative for double vision.  Respiratory: Negative for cough, hemoptysis, sputum production, shortness of breath and wheezing.   Cardiovascular:  Negative for chest pain, palpitations, orthopnea and leg swelling.  Gastrointestinal: Negative for abdominal pain, blood in stool, constipation, diarrhea, heartburn, melena, nausea and vomiting.  Genitourinary: Negative for dysuria, frequency and urgency.  Musculoskeletal: Positive for back pain and joint pain.  Skin: Negative.  Negative for itching and rash.  Neurological: Negative for dizziness, tingling, focal weakness, weakness and headaches.  Endo/Heme/Allergies: Does not bruise/bleed easily.  Psychiatric/Behavioral: Negative for depression. The patient is not nervous/anxious and does not have insomnia.      MEDICAL HISTORY:  Past Medical History:  Diagnosis Date  . Colon polyps   . COPD (chronic obstructive pulmonary disease) ([place])   . Diabetes mellitus without complication ([place])   . Headache   . Hyperlipidemia   . Hypertension   . IBS (irritable bowel syndrome)   . Peptic ulcer   . Tobacco abuse     SURGICAL HISTORY: Past Surgical History:  Procedure Laterality Date  . ABDOMINAL HYSTERECTOMY    . carbuncle removal     urethra  . COLONOSCOPY    . COLONOSCOPY WITH PROPOFOL N/A 11/05/2017   Procedure: COLONOSCOPY WITH PROPOFOL;  Surgeon: Lollie Sails, MD;  Location[facility] ENDOSCOPY;  Service: Endoscopy;  Laterality: N/A;  . ESOPHAGOGASTRODUODENOSCOPY    . ESOPHAGOGASTRODUODENOSCOPY (EGD) WITH PROPOFOL N/A 11/05/2017   Procedure: ESOPHAGOGASTRODUODENOSCOPY (EGD) WITH PROPOFOL;  Surgeon: Lollie Sails, MD;  Location: [facility] ENDOSCOPY;  Service: Endoscopy;  Laterality: N/A;    SOCIAL HISTORY: Social History   Socioeconomic History  . Marital status: Married    Spouse name: Not on file  . Number of children: Not on file  . Years of education: Not on file  . Highest education level: Not on file  Occupational History  . Not on  file  Social Needs  . Financial resource strain: Not on file  . Food insecurity:    Worry: Not on file    Inability: Not on file  .  Transportation needs:    Medical: Not on file    Non-medical: Not on file  Tobacco Use  . Smoking status: Current Some Day Smoker  Substance and Sexual Activity  . Alcohol use: No    Frequency: Never  . Drug use: No  . Sexual activity: Not on file  Lifestyle  . Physical activity:    Days per week: Not on file    Minutes per session: Not on file  . Stress: Not on file  Relationships  . Social connections:    Talks on phone: Not on file    Gets together: Not on file    Attends religious service: Not on file    Active member of club or organization: Not on file    Attends meetings of clubs or organizations: Not on file    Relationship status: Not on file  . Intimate partner violence:    Fear of current or ex partner: Not on file    Emotionally abused: Not on file    Physically abused: Not on file    Forced sexual activity: Not on file  Other Topics Concern  . 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 [place]      FAMILY HISTORY: History reviewed. No pertinent family history.  ALLERGIES:  is allergic to penicillins and sulfa antibiotics.  MEDICATIONS:  Current Outpatient Medications  Medication Sig Dispense Refill  . albuterol (PROVENTIL HFA;VENTOLIN HFA) 108 (90 Base) MCG/ACT inhaler Inhale into the lungs every 6 (six) hours as needed for wheezing or shortness of breath.    Marland Kitchen aspirin EC 81 MG tablet Take 81 mg by mouth daily.    Marland Kitchen ezetimibe (ZETIA) 10 MG tablet Take 10 mg by mouth daily.    . Fluticasone-Salmeterol (ADVAIR) 250-50 MCG/DOSE AEPB Inhale 1 puff into the lungs 2 (two) times daily.    . furosemide (LASIX) 20 MG tablet Take 20 mg by mouth.    Marland Kitchen ibuprofen (ADVIL,MOTRIN) 200 MG tablet Take 200 mg by mouth every 6 (six) hours as needed.    . insulin glargine (LANTUS) 100 UNIT/ML injection Inject 20 Units into the skin at bedtime.    . Ipratropium-Albuterol  (COMBIVENT) 20-100 MCG/ACT AERS respimat Inhale 1 puff into the lungs every 4 (four) hours as needed for wheezing.    Marland Kitchen lisinopril (PRINIVIL,ZESTRIL) 5 MG tablet Take 5 mg by mouth daily.    . meloxicam (MOBIC) 7.5 MG tablet Take 7.5 mg by mouth daily.    . metFORMIN (GLUCOPHAGE-XR) 500 MG 24 hr tablet Take 500 mg by mouth 3 (three) times daily.    . Multiple Vitamin (MULTIVITAMIN) tablet Take 1 tablet by mouth daily.    . pantoprazole (PROTONIX) 40 MG tablet Take 40 mg by mouth 2 (two) times daily before a meal.    . pramoxine-hydrocortisone (ANALPRAM HC) cream Apply topically 3 (three) times daily.    . rosuvastatin (CRESTOR) 40 MG tablet Take 40 mg by mouth daily.    Marland Kitchen tiotropium (SPIRIVA) 18 MCG inhalation capsule Place 18 mcg into inhaler and inhale daily.     No current facility-administered medications for this visit.       Marland Kitchen  PHYSICAL EXAMINATION: ECOG PERFORMANCE STATUS: 1 - Symptomatic  but completely ambulatory  Vitals:   09/27/18 1149  BP: 115/79  Pulse: 98  Temp: 97.9 F (36.6 C)   Filed Weights   09/27/18 1149  Weight: 131 lb (59.4 kg)    Physical Exam  Constitutional: She is oriented to person, place, and time and well-developed, well-nourished, and in no distress.  HENT:  Head: Normocephalic and atraumatic.  Mouth/Throat: Oropharynx is clear and moist. No oropharyngeal exudate.  Eyes: Pupils are equal, round, and reactive to light.  Neck: Normal range of motion. Neck supple.  Cardiovascular: Normal rate and regular rhythm.  Pulmonary/Chest: No respiratory distress. She has no wheezes.  Abdominal: Soft. Bowel sounds are normal. She exhibits no distension and no mass. There is no abdominal tenderness. There is no rebound and no guarding.  Musculoskeletal: Normal range of motion.        General: No tenderness or edema.  Neurological: She is alert and oriented to person, place, and time.  Skin: Skin is warm.  Psychiatric: Affect normal.     LABORATORY DATA:   I have reviewed the data as listed Lab Results  Component Value Date   WBC 6.8 10/29/2017   HGB 10.1 (L) 10/29/2017   HCT 28.7 (L) 10/29/2017   MCV 87.1 10/29/2017   PLT 425 10/29/2017   Recent Labs    10/29/17 0901  NA 141  K 3.4*  CL 106  CO2 26  GLUCOSE 131*  BUN 24*  CREATININE 1.61*  CALCIUM 8.9  GFRNONAA 31*  GFRAA 36*  PROT 6.6  ALBUMIN 2.9*  AST 21  ALT 17  ALKPHOS 60  BILITOT 0.3    RADIOGRAPHIC STUDIES: I have personally reviewed the radiological images as listed and agreed with the findings in the report. No results found.  ASSESSMENT & PLAN:   Monoclonal gammopathy #Question monoclonal gammopathy-mild anemia mild renal insufficiency normal calcium; chronic back pain/feeling poorly.  Serum M protein negative for any monoclonal protein; serum kappa lambda light chain ratio/urine kappa lambda light chain ratio slightly abnormal.  Question significance of abnormality.  Recommend skeletal survey for further evaluation.  #Discussed that if skeletal survey is abnormal-would recommend a bone marrow biopsy/patient always reluctant.  Discussed the potential complications/and that we would recommend under anesthesia.  However if skeletal survey is normal recommend reevaluation of the blood counts in approximately 2 months.  #  Weight loss- CT a/p/chest April/August 2019-negative for obvious malignancy.  Continue weight loss recommend-recommend dietary evaluation.  # COPD-stable/smoking-lung cancer screening program.  Thank you Dr.Klein for allowing me to participate in the care of your pleasant patient. Please do not hesitate to contact me with questions or concerns in the interim.  # follow up TBD [? In 2 months/labs] # DISPOSITION: # bone survey today- will call with results.  # follow up TBD   All questions were answered. The patient knows to call the clinic with any problems, questions or concerns.     Cammie Sickle, MD 09/27/2018 1:07 PM

## 2018-09-27 NOTE — Telephone Encounter (Signed)
Donna Brennan/[name] please inform patient/daughter that her x-rays of her bones-show no concerns for multiple myeloma.  I would recommend follow labs in 2 months; follow-up with me 1 week later.   [ please order CBC CMP; multiple myeloma panel; serum kappa lambda light chain ratio]-  Thanks GB

## 2018-09-27 NOTE — Assessment & Plan Note (Addendum)
#  Question monoclonal gammopathy-mild anemia mild renal insufficiency normal calcium; chronic back pain/feeling poorly.  Serum M protein negative for any monoclonal protein; serum kappa lambda light chain ratio/urine kappa lambda light chain ratio slightly abnormal.  Question significance of abnormality.  Recommend skeletal survey for further evaluation.  #Discussed that if skeletal survey is abnormal-would recommend a bone marrow biopsy/patient always reluctant.  Discussed the potential complications/and that we would recommend under anesthesia.  However if skeletal survey is normal recommend reevaluation of the blood counts in approximately 2 months.  #  Weight loss- CT a/p/chest April/August 2019-negative for obvious malignancy.  Continue weight loss recommend-recommend dietary evaluation.  # COPD-stable/smoking-lung cancer screening program.  Thank you Dr.Klein for allowing me to participate in the care of your pleasant patient. Please do not hesitate to contact me with questions or concerns in the interim.  # follow up TBD [? In 2 months/labs] # DISPOSITION: # bone survey today- will call with results.  # follow up TBD

## 2018-09-28 NOTE — Addendum Note (Signed)
Addended by: Sandria Bales B on: 09/28/2018 08:43 AM   Modules accepted: Orders

## 2018-09-28 NOTE — Telephone Encounter (Signed)
Patient and patient's daughter notified of these results and Dr. Sharmaine Base recommendations and verbalized understanding.

## 2018-11-29 ENCOUNTER — Inpatient Hospital Stay: Payer: Medicare Other | Attending: Internal Medicine

## 2018-11-29 DIAGNOSIS — D472 Monoclonal gammopathy: Secondary | ICD-10-CM | POA: Diagnosis not present

## 2018-11-29 LAB — COMPREHENSIVE METABOLIC PANEL
ALBUMIN: 2.9 g/dL — AB (ref 3.5–5.0)
ALT: 32 U/L (ref 0–44)
AST: 22 U/L (ref 15–41)
Alkaline Phosphatase: 66 U/L (ref 38–126)
Anion gap: 7 (ref 5–15)
BUN: 15 mg/dL (ref 8–23)
CHLORIDE: 104 mmol/L (ref 98–111)
CO2: 27 mmol/L (ref 22–32)
Calcium: 8.5 mg/dL — ABNORMAL LOW (ref 8.9–10.3)
Creatinine, Ser: 0.92 mg/dL (ref 0.44–1.00)
GFR calc Af Amer: >60 mL/min (ref >60)
GFR calc non Af Amer: >60 mL/min (ref >60)
GLUCOSE: 133 mg/dL — AB (ref 70–99)
Potassium: 3.9 mmol/L (ref 3.5–5.1)
Sodium: 138 mmol/L (ref 135–145)
Total Bilirubin: 0.7 mg/dL (ref 0.3–1.2)
Total Protein: 6.4 g/dL — ABNORMAL LOW (ref 6.5–8.1)

## 2018-11-29 LAB — CBC WITH DIFFERENTIAL/PLATELET
Abs Immature Granulocytes: 0.02 10*3/uL (ref 0.00–0.07)
Basophils Absolute: 0.1 10*3/uL (ref 0.0–0.1)
Basophils Relative: 1 %
Eosinophils Absolute: 0.1 10*3/uL (ref 0.0–0.5)
Eosinophils Relative: 2 %
HCT: 37.1 % (ref 36.0–46.0)
HEMOGLOBIN: 12.3 g/dL (ref 12.0–15.0)
Immature Granulocytes: 0 %
LYMPHS PCT: 28 %
Lymphs Abs: 1.7 10*3/uL (ref 0.7–4.0)
MCH: 30.5 pg (ref 26.0–34.0)
MCHC: 33.2 g/dL (ref 30.0–36.0)
MCV: 92.1 fL (ref 80.0–100.0)
MONO ABS: 0.6 10*3/uL (ref 0.1–1.0)
MONOS PCT: 9 %
Neutro Abs: 3.6 10*3/uL (ref 1.7–7.7)
Neutrophils Relative %: 60 %
PLATELETS: 254 10*3/uL (ref 150–400)
RBC: 4.03 MIL/uL (ref 3.87–5.11)
RDW: 12.8 % (ref 11.5–15.5)
WBC: 6 10*3/uL (ref 4.0–10.5)
nRBC: 0 % (ref 0.0–0.2)

## 2018-11-30 LAB — KAPPA/LAMBDA LIGHT CHAINS
KAPPA, LAMDA LIGHT CHAIN RATIO: 2.18 — AB (ref 0.26–1.65)
Kappa free light chain: 52.9 mg/L — ABNORMAL HIGH (ref 3.3–19.4)
LAMDA FREE LIGHT CHAINS: 24.3 mg/L (ref 5.7–26.3)

## 2018-12-01 LAB — MULTIPLE MYELOMA PANEL, SERUM
Albumin SerPl Elph-Mcnc: 2.8 g/dL — ABNORMAL LOW (ref 2.9–4.4)
Albumin/Glob SerPl: 1 (ref 0.7–1.7)
Alpha 1: 0.3 g/dL (ref 0.0–0.4)
Alpha2 Glob SerPl Elph-Mcnc: 0.9 g/dL (ref 0.4–1.0)
B-GLOBULIN SERPL ELPH-MCNC: 1 g/dL (ref 0.7–1.3)
GAMMA GLOB SERPL ELPH-MCNC: 0.9 g/dL (ref 0.4–1.8)
Globulin, Total: 3.1 g/dL (ref 2.2–3.9)
IGG (IMMUNOGLOBIN G), SERUM: 993 mg/dL (ref 700–1600)
IgA: 181 mg/dL (ref 64–422)
IgM (Immunoglobulin M), Srm: 72 mg/dL (ref 26–217)
Total Protein ELP: 5.9 g/dL — ABNORMAL LOW (ref 6.0–8.5)

## 2018-12-06 ENCOUNTER — Other Ambulatory Visit: Payer: Self-pay

## 2018-12-06 ENCOUNTER — Encounter (INDEPENDENT_AMBULATORY_CARE_PROVIDER_SITE_OTHER): Payer: Self-pay

## 2018-12-06 ENCOUNTER — Inpatient Hospital Stay: Payer: Medicare Other | Attending: Internal Medicine | Admitting: Internal Medicine

## 2018-12-06 VITALS — BP 146/74 | HR 72 | Temp 98.0°F | Resp 18

## 2018-12-06 DIAGNOSIS — Z720 Tobacco use: Secondary | ICD-10-CM | POA: Insufficient documentation

## 2018-12-06 DIAGNOSIS — D472 Monoclonal gammopathy: Secondary | ICD-10-CM | POA: Diagnosis not present

## 2018-12-06 DIAGNOSIS — J449 Chronic obstructive pulmonary disease, unspecified: Secondary | ICD-10-CM | POA: Diagnosis not present

## 2018-12-06 DIAGNOSIS — E1122 Type 2 diabetes mellitus with diabetic chronic kidney disease: Secondary | ICD-10-CM | POA: Insufficient documentation

## 2018-12-06 DIAGNOSIS — Z794 Long term (current) use of insulin: Secondary | ICD-10-CM | POA: Diagnosis not present

## 2018-12-06 DIAGNOSIS — N189 Chronic kidney disease, unspecified: Secondary | ICD-10-CM

## 2018-12-06 DIAGNOSIS — D649 Anemia, unspecified: Secondary | ICD-10-CM | POA: Diagnosis not present

## 2018-12-06 DIAGNOSIS — E8809 Other disorders of plasma-protein metabolism, not elsewhere classified: Secondary | ICD-10-CM | POA: Insufficient documentation

## 2018-12-06 NOTE — Progress Notes (Signed)
[place] NOTE  Patient Care Team: Donna Brennan, Donna Brennan as PCP - General (Internal Medicine)  CHIEF COMPLAINTS/PURPOSE OF CONSULTATIONJulianne Brennan  # December 2019 [PCP]-M protein serum negative; urine/kappa lambda light chain ratio-slightly abnormal [mild anemia hemoglobin 11; normal iron studies chronic kidney disease creatinine 1.2]; FEB 24th 2020- M-Protein- NEG; K/L= 2.5; Bone survey- NEG.   # DM- on insulin-good control; COPD/active smoker; status post colonoscopy Dr. Gustavo Brennan; mammogram-2019 normal  # LDCT/smoker  No history exists.     HISTORY OF PRESENTING ILLNESS:  Donna Brennan 73 y.o.  female is here for follow-up of possible monoclonal gammopathy.  Since last visit patient has been doing well.  Appetite improving.  No weight loss.  Her kidney numbers improving.  Denies any new onset of back pain or bone pain.  No chest pain or shortness with cough.  Review of Systems  Constitutional: Negative for chills, diaphoresis and fever.  HENT: Negative for nosebleeds and sore throat.   Eyes: Negative for double vision.  Respiratory: Negative for cough, hemoptysis, sputum production, shortness of breath and wheezing.   Cardiovascular: Negative for chest pain, palpitations, orthopnea and leg swelling.  Gastrointestinal: Negative for abdominal pain, blood in stool, constipation, diarrhea, heartburn, melena, nausea and vomiting.  Genitourinary: Negative for dysuria, frequency and urgency.  Musculoskeletal: Positive for back pain and joint pain.  Skin: Negative.  Negative for itching and rash.  Neurological: Negative for dizziness, tingling, focal weakness, weakness and headaches.  Endo/Heme/Allergies: Does not bruise/bleed easily.  Psychiatric/Behavioral: Negative for depression. The patient is not nervous/anxious and does not have insomnia.      MEDICAL HISTORY:  Past Medical History:  Diagnosis Date  . Colon polyps   . COPD (chronic  obstructive pulmonary disease) ([place])   . Diabetes mellitus without complication ([place])   . Headache   . Hyperlipidemia   . Hypertension   . IBS (irritable bowel syndrome)   . Peptic ulcer   . Tobacco abuse     SURGICAL HISTORY: Past Surgical History:  Procedure Laterality Date  . ABDOMINAL HYSTERECTOMY    . carbuncle removal     urethra  . COLONOSCOPY    . COLONOSCOPY WITH PROPOFOL N/A 11/05/2017   Procedure: COLONOSCOPY WITH PROPOFOL;  Surgeon: Lollie Sails, Donna Brennan;  Location: [facility] ENDOSCOPY;  Service: Endoscopy;  Laterality: N/A;  . ESOPHAGOGASTRODUODENOSCOPY    . ESOPHAGOGASTRODUODENOSCOPY (EGD) WITH PROPOFOL N/A 11/05/2017   Procedure: ESOPHAGOGASTRODUODENOSCOPY (EGD) WITH PROPOFOL;  Surgeon: Lollie Sails, Donna Brennan;  Location: [facility] ENDOSCOPY;  Service: Endoscopy;  Laterality: N/A;    SOCIAL HISTORY: Social History   Socioeconomic History  . Marital status: Married    Spouse name: Not on file  . Number of children: Not on file  . Years of education: Not on file  . Highest education level: Not on file  Occupational History  . Not on file  Social Needs  . Financial resource strain: Not on file  . Food insecurity:    Worry: Not on file    Inability: Not on file  . Transportation needs:    Medical: Not on file    Non-medical: Not on file  Tobacco Use  . Smoking status: Current Some Day Smoker  Substance and Sexual Activity  . Alcohol use: No    Frequency: Never  . Drug use: No  . Sexual activity: Not on file  Lifestyle  . Physical activity:    Days per week: Not on file    Minutes  per session: Not on file  . Stress: Not on file  Relationships  . Social connections:    Talks on phone: Not on file    Gets together: Not on file    Attends religious service: Not on file    Active member of club or organization: Not on file    Attends meetings of clubs or organizations: Not on file    Relationship status: Not on file  . Intimate partner violence:    Fear of  current or ex partner: Not on file    Emotionally abused: Not on file    Physically abused: Not on file    Forced sexual activity: Not on file  Other Topics Concern  . 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 [place]      FAMILY HISTORY: No family history on file.  ALLERGIES:  is allergic to metformin; penicillins; and sulfa antibiotics.  MEDICATIONS:  Current Outpatient Medications  Medication Sig Dispense Refill  . acetaminophen (TYLENOL) 500 MG tablet Take 500 mg by mouth every 6 (six) hours as needed.    Marland Kitchen albuterol (PROVENTIL HFA;VENTOLIN HFA) 108 (90 Base) MCG/ACT inhaler Inhale into the lungs every 6 (six) hours as needed for wheezing or shortness of breath.    . diazepam (VALIUM) 5 MG tablet Take 1 tablet by mouth as needed for dizziness.    . furosemide (LASIX) 20 MG tablet Take 20 mg by mouth.    . insulin glargine (LANTUS) 100 UNIT/ML injection Inject 20 Units into the skin at bedtime.    . Ipratropium-Albuterol (COMBIVENT) 20-100 MCG/ACT AERS respimat Inhale 1 puff into the lungs every 4 (four) hours as needed for wheezing.    Marland Kitchen omeprazole (PRILOSEC) 20 MG capsule Take 20 mg by mouth daily.    . rosuvastatin (CRESTOR) 40 MG tablet Take 40 mg by mouth daily.    . vitamin B-12 (CYANOCOBALAMIN) 1000 MCG tablet Take 1,000 mcg by mouth daily.     No current facility-administered medications for this visit.       Marland Kitchen  PHYSICAL EXAMINATION: ECOG PERFORMANCE STATUS: 1 - Symptomatic but completely ambulatory  Vitals:   12/06/18 1030  BP: (!) 146/74  Pulse: 72  Resp: 18  Temp: 98 F (36.7 C)   There were no vitals filed for this visit.  Physical Exam  Constitutional: She is oriented to person, place, and time and well-developed, well-nourished, and in no distress.  HENT:  Head: Normocephalic and atraumatic.  Mouth/Throat: Oropharynx is clear and moist. No  oropharyngeal exudate.  Eyes: Pupils are equal, round, and reactive to light.  Neck: Normal range of motion. Neck supple.  Cardiovascular: Normal rate and regular rhythm.  Pulmonary/Chest: No respiratory distress. She has no wheezes.  Abdominal: Soft. Bowel sounds are normal. She exhibits no distension and no mass. There is no abdominal tenderness. There is no rebound and no guarding.  Musculoskeletal: Normal range of motion.        General: No tenderness or edema.  Neurological: She is alert and oriented to person, place, and time.  Skin: Skin is warm.  Psychiatric: Affect normal.     LABORATORY DATA:  I have reviewed the data as listed Lab Results  Component Value Date   WBC 6.0 11/29/2018   HGB 12.3 11/29/2018   HCT 37.1 11/29/2018   MCV 92.1 11/29/2018   PLT 254 11/29/2018   Recent Labs  11/29/18 1018  NA 138  K 3.9  CL 104  CO2 27  GLUCOSE 133*  BUN 15  CREATININE 0.92  CALCIUM 8.5*  GFRNONAA >60  GFRAA >60  PROT 6.4*  ALBUMIN 2.9*  AST 22  ALT 32  ALKPHOS 66  BILITOT 0.7    RADIOGRAPHIC STUDIES: I have personally reviewed the radiological images as listed and agreed with the findings in the report. No results found.  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  Bone survey negative for osseous lesions.  CBC CMP normal; except for low albumin  #Again discussed with the patient and family regarding above possible MGUS.  A definite diagnosis could be made based upon bone marrow biopsy.  However, this would not change management at this time.  Recommend continued surveillance.  # COPD-stable/smoking-April 2019-low-dose CT scan-negative for any concerning nodules.  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  Chronic.  Defer to nephrology.  # DISPOSITION: # Follow up in 6 months/Donna Brennan- labs- cbc/cmp/MM panel; K-l light  chains- week prior- Dr.B   All questions were answered. The patient knows to call the clinic with any problems, questions or concerns.     Cammie Sickle, Donna Brennan 12/06/2018 10:58 AM

## 2018-12-06 NOTE — Assessment & Plan Note (Addendum)
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  Bone survey negative for osseous lesions.  CBC CMP normal; except for low albumin STABLE.   #Again discussed with the patient and family regarding above possible MGUS.  A definite diagnosis could be made based upon bone marrow biopsy.  However, this would not change management at this time.  Recommend continued surveillance.  # COPD-stable/smoking-April 2019-low-dose CT scan-negative for any concerning nodules.STABLE.   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  Chronic.  Defer to nephrology.STABLE.  # DISPOSITION: # Follow up in 6 months/MD- labs- cbc/cmp/MM panel; K-l light chains- week prior- Dr.B

## 2018-12-26 ENCOUNTER — Encounter: Payer: Self-pay | Admitting: *Deleted

## 2018-12-30 ENCOUNTER — Encounter: Payer: Self-pay | Admitting: *Deleted

## 2019-03-04 ENCOUNTER — Other Ambulatory Visit: Payer: Self-pay | Admitting: Internal Medicine

## 2019-03-04 DIAGNOSIS — Z1231 Encounter for screening mammogram for malignant neoplasm of breast: Secondary | ICD-10-CM

## 2019-03-07 ENCOUNTER — Telehealth: Payer: Self-pay | Admitting: *Deleted

## 2019-03-07 DIAGNOSIS — Z87891 Personal history of nicotine dependence: Secondary | ICD-10-CM

## 2019-03-07 DIAGNOSIS — Z122 Encounter for screening for malignant neoplasm of respiratory organs: Secondary | ICD-10-CM

## 2019-03-07 NOTE — Telephone Encounter (Signed)
Patient has been notified that annual lung cancer screening low dose CT scan is due currently or will be in near future. Confirmed that patient is within the age range of 55-77, and asymptomatic, (no signs or symptoms of lung cancer). Patient denies illness that would prevent curative treatment for lung cancer if found. Verified smoking history, (current, 33.5 pack year). The shared decision making visit was done 01/20/17. Patient is agreeable for CT scan being scheduled.

## 2019-03-10 ENCOUNTER — Ambulatory Visit
Admission: RE | Admit: 2019-03-10 | Discharge: 2019-03-10 | Disposition: A | Discharge status: Home / Self Care | Payer: Medicare Other | Source: Ambulatory Visit | Attending: Oncology | Admitting: Oncology

## 2019-03-10 ENCOUNTER — Other Ambulatory Visit: Payer: Self-pay

## 2019-03-10 DIAGNOSIS — Z87891 Personal history of nicotine dependence: Secondary | ICD-10-CM | POA: Diagnosis present

## 2019-03-10 DIAGNOSIS — Z122 Encounter for screening for malignant neoplasm of respiratory organs: Secondary | ICD-10-CM | POA: Insufficient documentation

## 2019-03-16 ENCOUNTER — Telehealth: Payer: Self-pay | Admitting: *Deleted

## 2019-03-16 NOTE — Telephone Encounter (Signed)
Notified patient of LDCT lung cancer screening program results with recommendation for 6 month follow up imaging. Also notified of incidental findings noted below and is encouraged to discuss further with PCP who will receive a copy of this note and/or the CT report. Patient verbalizes understanding.   IMPRESSION: Lung-RADS 3, probably benign findings. Short-term follow-up in 6 months is recommended with repeat low-dose chest CT without contrast (please use the following order, "CT CHEST LCS NODULE FOLLOW-UP W/O CM").  New clustered nodularity in the central left lower lobe measuring up to 5.3 mm.  Aortic Atherosclerosis (ICD10-I70.0).

## 2019-04-15 ENCOUNTER — Other Ambulatory Visit: Payer: Self-pay

## 2019-04-15 ENCOUNTER — Ambulatory Visit
Admission: RE | Admit: 2019-04-15 | Discharge: 2019-04-15 | Disposition: A | Discharge status: Home / Self Care | Payer: Medicare Other | Source: Ambulatory Visit | Attending: Internal Medicine | Admitting: Internal Medicine

## 2019-04-15 DIAGNOSIS — Z1231 Encounter for screening mammogram for malignant neoplasm of breast: Secondary | ICD-10-CM

## 2019-04-20 ENCOUNTER — Other Ambulatory Visit: Payer: Self-pay | Admitting: Gastroenterology

## 2019-04-20 DIAGNOSIS — R1011 Right upper quadrant pain: Secondary | ICD-10-CM

## 2019-04-22 ENCOUNTER — Other Ambulatory Visit: Payer: Self-pay

## 2019-04-22 ENCOUNTER — Ambulatory Visit
Admission: RE | Admit: 2019-04-22 | Discharge: 2019-04-22 | Disposition: A | Discharge status: Home / Self Care | Payer: Medicare Other | Source: Ambulatory Visit | Attending: Gastroenterology | Admitting: Gastroenterology

## 2019-04-22 DIAGNOSIS — R1011 Right upper quadrant pain: Secondary | ICD-10-CM | POA: Insufficient documentation

## 2019-05-27 ENCOUNTER — Other Ambulatory Visit: Payer: Self-pay

## 2019-05-30 ENCOUNTER — Other Ambulatory Visit: Payer: Self-pay

## 2019-05-30 ENCOUNTER — Inpatient Hospital Stay: Payer: Medicare Other | Attending: Internal Medicine

## 2019-05-30 DIAGNOSIS — E119 Type 2 diabetes mellitus without complications: Secondary | ICD-10-CM | POA: Insufficient documentation

## 2019-05-30 DIAGNOSIS — D649 Anemia, unspecified: Secondary | ICD-10-CM | POA: Insufficient documentation

## 2019-05-30 DIAGNOSIS — R779 Abnormality of plasma protein, unspecified: Secondary | ICD-10-CM | POA: Insufficient documentation

## 2019-05-30 DIAGNOSIS — J449 Chronic obstructive pulmonary disease, unspecified: Secondary | ICD-10-CM | POA: Diagnosis not present

## 2019-05-30 DIAGNOSIS — Z720 Tobacco use: Secondary | ICD-10-CM | POA: Insufficient documentation

## 2019-05-30 DIAGNOSIS — E8809 Other disorders of plasma-protein metabolism, not elsewhere classified: Secondary | ICD-10-CM | POA: Diagnosis not present

## 2019-05-30 DIAGNOSIS — Z794 Long term (current) use of insulin: Secondary | ICD-10-CM | POA: Insufficient documentation

## 2019-05-30 DIAGNOSIS — D472 Monoclonal gammopathy: Secondary | ICD-10-CM

## 2019-05-30 LAB — CBC WITH DIFFERENTIAL/PLATELET
Abs Immature Granulocytes: 0.02 10*3/uL (ref 0.00–0.07)
Basophils Absolute: 0.1 10*3/uL (ref 0.0–0.1)
Basophils Relative: 1 %
Eosinophils Absolute: 0.1 10*3/uL (ref 0.0–0.5)
Eosinophils Relative: 2 %
HCT: 37.3 % (ref 36.0–46.0)
Hemoglobin: 12.8 g/dL (ref 12.0–15.0)
Immature Granulocytes: 0 %
Lymphocytes Relative: 26 %
Lymphs Abs: 2 10*3/uL (ref 0.7–4.0)
MCH: 31 pg (ref 26.0–34.0)
MCHC: 34.3 g/dL (ref 30.0–36.0)
MCV: 90.3 fL (ref 80.0–100.0)
Monocytes Absolute: 0.6 10*3/uL (ref 0.1–1.0)
Monocytes Relative: 8 %
Neutro Abs: 4.8 10*3/uL (ref 1.7–7.7)
Neutrophils Relative %: 63 %
Platelets: 302 10*3/uL (ref 150–400)
RBC: 4.13 MIL/uL (ref 3.87–5.11)
RDW: 12.1 % (ref 11.5–15.5)
WBC: 7.7 10*3/uL (ref 4.0–10.5)
nRBC: 0 % (ref 0.0–0.2)

## 2019-05-30 LAB — COMPREHENSIVE METABOLIC PANEL
ALT: 21 U/L (ref 0–44)
AST: 17 U/L (ref 15–41)
Albumin: 3 g/dL — ABNORMAL LOW (ref 3.5–5.0)
Alkaline Phosphatase: 68 U/L (ref 38–126)
Anion gap: 9 (ref 5–15)
BUN: 17 mg/dL (ref 8–23)
CO2: 26 mmol/L (ref 22–32)
Calcium: 8.7 mg/dL — ABNORMAL LOW (ref 8.9–10.3)
Chloride: 104 mmol/L (ref 98–111)
Creatinine, Ser: 1.06 mg/dL — ABNORMAL HIGH (ref 0.44–1.00)
GFR calc Af Amer: 60 mL/min — ABNORMAL LOW (ref >60)
GFR calc non Af Amer: 52 mL/min — ABNORMAL LOW (ref >60)
Glucose, Bld: 141 mg/dL — ABNORMAL HIGH (ref 70–99)
Potassium: 4.2 mmol/L (ref 3.5–5.1)
Sodium: 139 mmol/L (ref 135–145)
Total Bilirubin: 0.7 mg/dL (ref 0.3–1.2)
Total Protein: 6.9 g/dL (ref 6.5–8.1)

## 2019-05-31 LAB — MULTIPLE MYELOMA PANEL, SERUM
Albumin SerPl Elph-Mcnc: 3 g/dL (ref 2.9–4.4)
Albumin/Glob SerPl: 1.1 (ref 0.7–1.7)
Alpha 1: 0.2 g/dL (ref 0.0–0.4)
Alpha2 Glob SerPl Elph-Mcnc: 0.9 g/dL (ref 0.4–1.0)
B-Globulin SerPl Elph-Mcnc: 0.9 g/dL (ref 0.7–1.3)
Gamma Glob SerPl Elph-Mcnc: 0.8 g/dL (ref 0.4–1.8)
Globulin, Total: 2.9 g/dL (ref 2.2–3.9)
IgA: 185 mg/dL (ref 64–422)
IgG (Immunoglobin G), Serum: 966 mg/dL (ref 586–1602)
IgM (Immunoglobulin M), Srm: 76 mg/dL (ref 26–217)
Total Protein ELP: 5.9 g/dL — ABNORMAL LOW (ref 6.0–8.5)

## 2019-05-31 LAB — KAPPA/LAMBDA LIGHT CHAINS
Kappa free light chain: 54.2 mg/L — ABNORMAL HIGH (ref 3.3–19.4)
Kappa, lambda light chain ratio: 1.77 — ABNORMAL HIGH (ref 0.26–1.65)
Lambda free light chains: 30.7 mg/L — ABNORMAL HIGH (ref 5.7–26.3)

## 2019-06-05 ENCOUNTER — Other Ambulatory Visit: Payer: Self-pay

## 2019-06-05 ENCOUNTER — Encounter: Payer: Self-pay | Admitting: Internal Medicine

## 2019-06-05 NOTE — Progress Notes (Signed)
Patient stated that she had been doing well with no complaints. However, patient would like her daughter Donna Brennan to be contacted when physician is in the room.

## 2019-06-06 ENCOUNTER — Other Ambulatory Visit: Payer: Self-pay

## 2019-06-06 ENCOUNTER — Inpatient Hospital Stay (HOSPITAL_BASED_OUTPATIENT_CLINIC_OR_DEPARTMENT_OTHER): Payer: Medicare Other | Admitting: Internal Medicine

## 2019-06-06 DIAGNOSIS — D472 Monoclonal gammopathy: Secondary | ICD-10-CM

## 2019-06-06 DIAGNOSIS — R779 Abnormality of plasma protein, unspecified: Secondary | ICD-10-CM | POA: Diagnosis not present

## 2019-06-06 NOTE — Assessment & Plan Note (Addendum)
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  Bone survey negative for osseous lesions.  Stable  #August 2020-monoclonal work-up negative except for slightly elevated kappa lambda light chain ratio at 1.75 [although improved from 6 months ago].  CBC CMP normal except for chronic low albumin 2.5.   #Still clinically this is unlikely monoclonal gammopathy.  Recommend 24-hour urine collection for kappa lambda light chains-and if normal would recommend no further work-up/surveillance.  # COPD-stable/smoking-JUne 2020- low-dose CT scan-negative for any concerning nodules.; awaiting repeat imaging in 6 months. STABLE.   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  Chronic.  Defer to nephrology; Dr.Singh stable  #Discussed with patient daughter [name].  # DISPOSITION: # 24 hour urine jug today. Will call with results.  # Follow up as needed-  Dr.B

## 2019-06-06 NOTE — Progress Notes (Signed)
[facility] NOTE  Patient Care Team: Adin Hector, MD as PCP - General (Internal Medicine)  CHIEF COMPLAINTS/PURPOSE OF CONSULTATIONJulianne Brennan  # December 2019 [PCP]-M protein serum negative; urine/kappa lambda light chain ratio-slightly abnormal [mild anemia hemoglobin 11; normal iron studies chronic kidney disease creatinine 1.2]; FEB 24th 2020- M-Protein- NEG; K/L= 2.5; Bone survey- NEG.   # DM- on insulin-good control; COPD/active smoker; status post colonoscopy Dr. Gustavo Lah; mammogram-2019 normal  # LDCT/smoker Oncology History   No history exists.   HISTORY OF PRESENTING ILLNESS:  Donna Brennan 73 y.o.  female is here for follow-up of possible monoclonal gammopathy.  As per the daughter/patient-her kidney numbers were off few weeks ago; and she had been evaluated by Dr. Candiss Norse since then.  He has adjusted medications.  Patient denies any blood in stools or black-colored stools.  Appetite is improving.  No nausea no vomiting.  No tingling and numbness.  Chronic joint pains back pain not any worse.  Review of Systems  Constitutional: Negative for chills, diaphoresis and fever.  HENT: Negative for nosebleeds and sore throat.   Eyes: Negative for double vision.  Respiratory: Negative for cough, hemoptysis, sputum production, shortness of breath and wheezing.   Cardiovascular: Negative for chest pain, palpitations, orthopnea and leg swelling.  Gastrointestinal: Negative for abdominal pain, blood in stool, constipation, diarrhea, heartburn, melena, nausea and vomiting.  Genitourinary: Negative for dysuria, frequency and urgency.  Musculoskeletal: Positive for back pain and joint pain.  Skin: Negative.  Negative for itching and rash.  Neurological: Negative for dizziness, tingling, focal weakness, weakness and headaches.  Endo/Heme/Allergies: Does not bruise/bleed easily.  Psychiatric/Behavioral: Negative for depression. The patient is not  nervous/anxious and does not have insomnia.      MEDICAL HISTORY:  Past Medical History:  Diagnosis Date  . Colon polyps   . COPD (chronic obstructive pulmonary disease) ([place])   . Diabetes mellitus without complication ([place])   . Headache   . Hyperlipidemia   . Hypertension   . IBS (irritable bowel syndrome)   . Peptic ulcer   . Tobacco abuse     SURGICAL HISTORY: Past Surgical History:  Procedure Laterality Date  . ABDOMINAL HYSTERECTOMY    . carbuncle removal     urethra  . COLONOSCOPY    . COLONOSCOPY WITH PROPOFOL N/A 11/05/2017   Procedure: COLONOSCOPY WITH PROPOFOL;  Surgeon: Lollie Sails, MD;  Location: [facility] ENDOSCOPY;  Service: Endoscopy;  Laterality: N/A;  . ESOPHAGOGASTRODUODENOSCOPY    . ESOPHAGOGASTRODUODENOSCOPY (EGD) WITH PROPOFOL N/A 11/05/2017   Procedure: ESOPHAGOGASTRODUODENOSCOPY (EGD) WITH PROPOFOL;  Surgeon: Lollie Sails, MD;  Location: [facility] ENDOSCOPY;  Service: Endoscopy;  Laterality: N/A;    SOCIAL HISTORY: Social History   Socioeconomic History  . Marital status: Married    Spouse name: Not on file  . Number of children: Not on file  . Years of education: Not on file  . Highest education level: Not on file  Occupational History  . Not on file  Social Needs  . Financial resource strain: Not on file  . Food insecurity    Worry: Not on file    Inability: Not on file  . Transportation needs    Medical: Not on file    Non-medical: Not on file  Tobacco Use  . Smoking status: Current Some Day Smoker    Packs/day: 0.25    Years: 45.00    Pack years: 11.25    Types: Cigarettes  . Smokeless tobacco:  Never Used  Substance and Sexual Activity  . Alcohol use: No    Frequency: Never  . Drug use: No  . Sexual activity: Not on file  Lifestyle  . Physical activity    Days per week: Not on file    Minutes per session: Not on file  . Stress: Not on file  Relationships  . Social Herbalist on phone: Not on file    Gets  together: Not on file    Attends religious service: Not on file    Active member of club or organization: Not on file    Attends meetings of clubs or organizations: Not on file    Relationship status: Not on file  . Intimate partner violence    Fear of current or ex partner: Not on file    Emotionally abused: Not on file    Physically abused: Not on file    Forced sexual activity: Not on file  Other Topics Concern  . 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 [place]      FAMILY HISTORY: History reviewed. No pertinent family history.  ALLERGIES:  is allergic to metformin; penicillins; and sulfa antibiotics.  MEDICATIONS:  Current Outpatient Medications  Medication Sig Dispense Refill  . acetaminophen (TYLENOL) 500 MG tablet Take 500 mg by mouth every 6 (six) hours as needed.    Marland Kitchen albuterol (PROVENTIL HFA;VENTOLIN HFA) 108 (90 Base) MCG/ACT inhaler Inhale into the lungs every 6 (six) hours as needed for wheezing or shortness of breath.    . ezetimibe (ZETIA) 10 MG tablet Take 1 tablet by mouth 1 day or 1 dose.    Marland Kitchen Fluticasone-Salmeterol (ADVAIR) 250-50 MCG/DOSE AEPB Inhale 1 puff into the lungs 2 (two) times daily.    . hyoscyamine (LEVBID) 0.375 MG 12 hr tablet Take 1 tablet by mouth 1 day or 1 dose.    . insulin glargine (LANTUS) 100 UNIT/ML injection Inject 24 Units into the skin at bedtime.     . Multiple Vitamin (MULTI-VITAMIN) tablet Take 1 tablet by mouth 1 day or 1 dose.    Marland Kitchen omeprazole (PRILOSEC) 20 MG capsule Take 20 mg by mouth daily.    . rosuvastatin (CRESTOR) 40 MG tablet Take 40 mg by mouth daily.    . vitamin B-12 (CYANOCOBALAMIN) 1000 MCG tablet Take 1,000 mcg by mouth daily.    . diazepam (VALIUM) 5 MG tablet Take 1 tablet by mouth as needed for dizziness.    . furosemide (LASIX) 20 MG tablet Take 20 mg by mouth.    . Ipratropium-Albuterol (COMBIVENT) 20-100  MCG/ACT AERS respimat Inhale 1 puff into the lungs every 4 (four) hours as needed for wheezing.     No current facility-administered medications for this visit.       Marland Kitchen  PHYSICAL EXAMINATION: ECOG PERFORMANCE STATUS: 1 - Symptomatic but completely ambulatory  Vitals:   06/05/19 1908  BP: (!) 148/78  Pulse: 74  Temp: (!) 97.4 F (36.3 C)   Filed Weights   06/05/19 1908  Weight: 144 lb (65.3 kg)    Physical Exam  Constitutional: She is oriented to person, place, and time and well-developed, well-nourished, and in no distress.  HENT:  Head: Normocephalic and atraumatic.  Mouth/Throat: Oropharynx is clear and moist. No oropharyngeal exudate.  Eyes: Pupils are equal, round, and reactive to light.  Neck: Normal range of motion. Neck supple.  Cardiovascular: Normal rate and regular rhythm.  Pulmonary/Chest: No respiratory distress. She has no wheezes.  Abdominal: Soft. Bowel sounds are normal. She exhibits no distension and no mass. There is no abdominal tenderness. There is no rebound and no guarding.  Musculoskeletal: Normal range of motion.        General: No tenderness or edema.  Neurological: She is alert and oriented to person, place, and time.  Skin: Skin is warm.  Psychiatric: Affect normal.     LABORATORY DATA:  I have reviewed the data as listed Lab Results  Component Value Date   WBC 7.7 05/30/2019   HGB 12.8 05/30/2019   HCT 37.3 05/30/2019   MCV 90.3 05/30/2019   PLT 302 05/30/2019   Recent Labs    11/29/18 1018 05/30/19 1030  NA 138 139  K 3.9 4.2  CL 104 104  CO2 27 26  GLUCOSE 133* 141*  BUN 15 17  CREATININE 0.92 1.06*  CALCIUM 8.5* 8.7*  GFRNONAA >60 52*  GFRAA >60 60*  PROT 6.4* 6.9  ALBUMIN 2.9* 3.0*  AST 22 17  ALT 32 21  ALKPHOS 66 68  BILITOT 0.7 0.7    RADIOGRAPHIC STUDIES: I have personally reviewed the radiological images as listed and agreed with the findings in the report. No results found.  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  Bone survey negative for osseous lesions.  Stable  #August 2020-monoclonal work-up negative except for slightly elevated kappa lambda light chain ratio at 1.75 [although improved from 6 months ago].  CBC CMP normal except for chronic low albumin 2.5.   #Still clinically this is unlikely monoclonal gammopathy.  Recommend 24-hour urine collection for kappa lambda light chains-and if normal would recommend no further work-up/surveillance.  # COPD-stable/smoking-JUne 2020- low-dose CT scan-negative for any concerning nodules.; awaiting repeat imaging in 6 months. STABLE.   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  Chronic.  Defer to nephrology; Dr.Singh stable  #Discussed with patient daughter [name].  # DISPOSITION: # 24 hour urine jug today. Will call with results.  # Follow up as needed-  Dr.B   All questions were answered. The patient knows to call the clinic with any problems, questions or concerns.     Cammie Sickle, MD 06/06/2019 11:27 AM

## 2019-06-08 DIAGNOSIS — R779 Abnormality of plasma protein, unspecified: Secondary | ICD-10-CM | POA: Diagnosis not present

## 2019-06-10 LAB — KAPPA/LAMBDA LIGHT CHAINS, FREE, WITH RATIO, 24HR. URINE
FR KAPPA LT CH,24HR: 130.33 mg/24 hr
FR LAMBDA LT CH,24HR: 7.85 mg/24 hr
Free Kappa Lt Chains,Ur: 104.26 mg/L (ref 0.63–113.79)
Free Kappa/Lambda Ratio: 16.6 (ref 1.03–31.76)
Free Lambda Lt Chains,Ur: 6.28 mg/L (ref 0.47–11.77)
Total Volume: 1250

## 2019-06-14 ENCOUNTER — Telehealth: Payer: Self-pay | Admitting: Internal Medicine

## 2019-06-14 NOTE — Telephone Encounter (Signed)
On September 4 I spoke to patient's daughter [name] regarding results of the urine M protein/negative.  I would not recommend any further work-up or surveillance for " MGUS".  Agrees with the plan.

## 2019-08-31 ENCOUNTER — Telehealth: Payer: Self-pay | Admitting: *Deleted

## 2019-08-31 DIAGNOSIS — Z122 Encounter for screening for malignant neoplasm of respiratory organs: Secondary | ICD-10-CM

## 2019-08-31 DIAGNOSIS — R918 Other nonspecific abnormal finding of lung field: Secondary | ICD-10-CM

## 2019-08-31 DIAGNOSIS — Z87891 Personal history of nicotine dependence: Secondary | ICD-10-CM

## 2019-08-31 NOTE — Telephone Encounter (Signed)
Left a voicemail to inform patient that it is time to schedule her 6 month follow up CT scan for the lung cancer screening. I told her to call back with her scheduling preference so we can get her scheduled for her next CT scan.

## 2019-09-05 DIAGNOSIS — N1831 Chronic kidney disease, stage 3a: Secondary | ICD-10-CM | POA: Insufficient documentation

## 2019-09-05 NOTE — Telephone Encounter (Signed)
Contacted and scheduled for LCS Nodule follow up

## 2019-09-14 ENCOUNTER — Ambulatory Visit
Admission: RE | Admit: 2019-09-14 | Discharge: 2019-09-14 | Disposition: A | Discharge status: Home / Self Care | Payer: Medicare Other | Source: Ambulatory Visit | Attending: Nurse Practitioner | Admitting: Nurse Practitioner

## 2019-09-14 ENCOUNTER — Other Ambulatory Visit: Payer: Self-pay

## 2019-09-14 DIAGNOSIS — Z122 Encounter for screening for malignant neoplasm of respiratory organs: Secondary | ICD-10-CM | POA: Diagnosis present

## 2019-09-14 DIAGNOSIS — Z87891 Personal history of nicotine dependence: Secondary | ICD-10-CM

## 2019-09-14 DIAGNOSIS — R918 Other nonspecific abnormal finding of lung field: Secondary | ICD-10-CM | POA: Diagnosis present

## 2019-09-15 ENCOUNTER — Telehealth: Payer: Self-pay | Admitting: *Deleted

## 2019-09-15 NOTE — Telephone Encounter (Signed)
Called report  IMPRESSION: Lung-RADS 4B, suspicious. Additional imaging evaluation or consultation with Pulmonology or Thoracic Surgery recommended. Interval progression of the central left lower lobe pulmonary nodule now measuring up to 9.6 mm. This lesion had a clustered micro nodular appearance on the previous study and apparently has coalesced into a dominant lobular nodule on today's study. This would not be a typical presentation for neoplasm, but the interval progression meets CT criteria for the Lung-RADS category. There is associated irregular bronchial wall thickening of an adjacent small airway.  Aortic Atherosclerois (ICD10-170.0)  These results will be called to the ordering clinician or representative by the Radiologist Assistant, and communication documented in the PACS or zVision Dashboard.   Electronically Signed   By: Misty Stanley M.D.   On: 09/14/2019 16:56

## 2019-09-20 ENCOUNTER — Telehealth: Payer: Self-pay | Admitting: *Deleted

## 2019-09-20 DIAGNOSIS — R911 Solitary pulmonary nodule: Secondary | ICD-10-CM

## 2019-09-20 NOTE — Telephone Encounter (Signed)
After discussion with Dr. Lanney Gins and Thoracic navigator, notification message left with PCP office and patient notified of lung screening results with recommendation for PET scan. Patient is in agreement with this plan and will be anticipating an appt.   IMPRESSION: Lung-RADS 4B, suspicious. Additional imaging evaluation or consultation with Pulmonology or Thoracic Surgery recommended. Interval progression of the central left lower lobe pulmonary nodule now measuring up to 9.6 mm. This lesion had a clustered micro nodular appearance on the previous study and apparently has coalesced into a dominant lobular nodule on today's study. This would not be a typical presentation for neoplasm, but the interval progression meets CT criteria for the Lung-RADS category. There is associated irregular bronchial wall thickening of an adjacent small airway.  Aortic Atherosclerois (ICD10-170.0)

## 2019-09-21 ENCOUNTER — Encounter: Payer: Self-pay | Admitting: Nurse Practitioner

## 2019-09-21 NOTE — Progress Notes (Signed)
Donna Brennan, 73 y.o. female who was referred to the lung cancer screening program/low-dose CT for use of tobacco products.  she had a low-dose chest CT on 09/14/2019 which noted 5.3 mm central left lower lobe nodule with adjacent clustered micro nodularity progressed since previous imaging with confluence of the micronodularity into a lobular nodule now measuring 9.6 mm in volume derived diameter. Findings concerning for primary bronchogenic carcinoma/underlying malignancy.  she will require PET scan to further evaluate and assist in medical decision-making including possible biopsy and to optimize management. Patient has been referred to [facility].

## 2019-09-27 ENCOUNTER — Encounter
Admission: RE | Admit: 2019-09-27 | Discharge: 2019-09-27 | Disposition: A | Discharge status: Home / Self Care | Payer: Medicare Other | Source: Ambulatory Visit | Attending: Nurse Practitioner | Admitting: Nurse Practitioner

## 2019-09-27 ENCOUNTER — Other Ambulatory Visit: Payer: Self-pay

## 2019-09-27 DIAGNOSIS — I700 Atherosclerosis of aorta: Secondary | ICD-10-CM | POA: Insufficient documentation

## 2019-09-27 DIAGNOSIS — K7689 Other specified diseases of liver: Secondary | ICD-10-CM | POA: Diagnosis not present

## 2019-09-27 DIAGNOSIS — I2510 Atherosclerotic heart disease of native coronary artery without angina pectoris: Secondary | ICD-10-CM | POA: Diagnosis not present

## 2019-09-27 DIAGNOSIS — R911 Solitary pulmonary nodule: Secondary | ICD-10-CM | POA: Insufficient documentation

## 2019-09-27 LAB — GLUCOSE, CAPILLARY: Glucose-Capillary: 164 mg/dL — ABNORMAL HIGH (ref 70–99)

## 2019-09-27 MED ORDER — FLUDEOXYGLUCOSE F - 18 (FDG) INJECTION
7.5000 | Freq: Once | INTRAVENOUS | Status: AC | PRN
  Administered 2019-09-27: 7.96 via INTRAVENOUS

## 2019-10-04 ENCOUNTER — Other Ambulatory Visit: Payer: Self-pay | Admitting: *Deleted

## 2019-10-04 ENCOUNTER — Telehealth: Payer: Self-pay | Admitting: *Deleted

## 2019-10-04 NOTE — Telephone Encounter (Signed)
After discussion with medical oncology, patient and pulmonary office are notified of the PET scan results. Will review with weekly case conference for management recommendations.

## 2019-10-05 ENCOUNTER — Other Ambulatory Visit: Payer: Self-pay | Admitting: *Deleted

## 2019-10-05 NOTE — Progress Notes (Signed)
Discussed with Dr. Rogue Bussing who recommends review at case conference. He also recommends evaluation with pulmonology and medical-oncology. Appointments made. Burgess Estelle, RN coordinating care.

## 2019-10-13 ENCOUNTER — Other Ambulatory Visit: Payer: Medicare Other

## 2019-10-13 NOTE — Progress Notes (Signed)
Tumor Board Documentation  Donna Brennan was presented by Dr Rogue Bussing at our Tumor Board on 10/13/2019, which included representatives from medical oncology, radiation oncology, navigation, pathology, radiology, surgical, surgical oncology, internal medicine, pharmacy, genetics, palliative care, research.  Donna Brennan currently presents as a current patient, for discussion with history of the following treatments: active survellience.  Additionally, we reviewed previous medical and familial history, history of present illness, and recent lab results along with all available histopathologic and imaging studies. The tumor board considered available treatment options and made the following recommendations:   Refer to pulmomology, repeat CT in 3 months  The following procedures/referrals were also placed: No orders of the defined types were placed in this encounter.   Clinical Trial Status: not discussed   Staging used:    National site-specific guidelines   were discussed with respect to the case.  Tumor board is a meeting of clinicians from various specialty areas who evaluate and discuss patients for whom a multidisciplinary approach is being considered. Final determinations in the plan of care are those of the provider(s). The responsibility for follow up of recommendations given during tumor board is that of the provider.   Today's extended care, comprehensive team conference, Donna Brennan was not present for the discussion and was not examined.   Multidisciplinary Tumor Board is a multidisciplinary case peer review process.  Decisions discussed in the Multidisciplinary Tumor Board reflect the opinions of the specialists present at the conference without having examined the patient.  Ultimately, treatment and diagnostic decisions rest with the primary provider(s) and the patient.

## 2019-10-14 ENCOUNTER — Telehealth: Payer: Self-pay | Admitting: *Deleted

## 2019-10-14 NOTE — Telephone Encounter (Signed)
After review in tumor conference, message sent to Dr. Lanney Gins and left with Dr. Gust Brooms nurse of recommendation for evaluation by pulmonary for consideration of biopsy vs short term follow up imaging. Discussed at length with patient who is in agreement with this plan and verbalizes that she has an appointment with Dr. Raul Del later this month. Patient is aware that she can contact me with any questions or concerns.   IMPRESSION: 15 mm pulmonary nodule in the central left lower lobe shows low-grade FDG uptake. Low-grade adenocarcinoma cannot be excluded.  No evidence of local or distant metastatic disease.

## 2019-11-25 ENCOUNTER — Other Ambulatory Visit: Payer: Self-pay

## 2019-11-25 ENCOUNTER — Encounter
Admission: RE | Admit: 2019-11-25 | Discharge: 2019-11-25 | Disposition: A | Discharge status: Home / Self Care | Payer: Medicare Other | Source: Ambulatory Visit | Attending: Pulmonary Disease | Admitting: Pulmonary Disease

## 2019-11-25 HISTORY — DX: Gastro-esophageal reflux disease without esophagitis: K21.9

## 2019-11-25 NOTE — Patient Instructions (Signed)
Your procedure is scheduled on: Friday 12-02-2019 Report to Day Surgery on the 2nd floor of the [facility]. To find out your arrival time, please call [phone] between 1PM - 3PM [id number] 12-01-2019  REMEMBER: Instructions that are not followed completely may result in serious medical risk, up to and including death; or upon the discretion of your surgeon and anesthesiologist your surgery may need to be rescheduled.  Do not eat food after midnight the night before surgery.  No gum chewing, lozengers or hard candies.  You may however, drink CLEAR liquids up to 2 hours before you are scheduled to arrive for your surgery. Do not drink anything within 2 hours of the start of your surgery.  Clear liquids include: - water   Do NOT drink anything that is not on this list.  Type 1 and Type 2 diabetics should only drink water.  No Alcohol for 24 hours before or after surgery.  No Smoking including e-cigarettes for 24 hours prior to surgery.  No chewable tobacco products for at least 6 hours prior to surgery.  No nicotine patches on the day of surgery.  On the morning of surgery brush your teeth with toothpaste and water, you may rinse your mouth with mouthwash if you wish. Do not swallow any toothpaste or mouthwash.  Notify your doctor if there is any change in your medical condition (cold, fever, infection).  Do not wear jewelry, make-up, hairpins, clips or nail polish.  Do not wear lotions, powders, or perfumes.   Do not shave 48 hours prior to surgery.   Contacts and dentures may not be worn into surgery.  Do not bring valuables to the hospital, including drivers license, insurance or credit cards.  [facility] is not responsible for any belongings or valuables.   TAKE THESE MEDICATIONS THE MORNING OF SURGERY: ZETIA LEVBID OMEPRAZOLE (take one the night before and one on the morning of surgery - helps to prevent nausea after surgery.)  SHOWER DAY OF SURGERY  Use  inhalers on the day   Take 1/2 of usual insulin dose the night before surgery and none on the morning of surgery.  Follow recommendations from Cardiologist, Pulmonologist or PCP regarding stopping Aspirin, Coumadin, Plavix, Eliquis, Pradaxa, or Pletal.  Stop Anti-inflammatories (NSAIDS) such as Advil, Aleve, Ibuprofen, Motrin, Naproxen, Naprosyn and Aspirin based products such as Excedrin, Goodys Powder, BC Powder AND MELOXICAM (May take Tylenol or Acetaminophen if needed.)  Stop ANY OVER THE COUNTER supplements until after surgery. (May continue Vitamin D, Vitamin B, and multivitamin.)  Wear comfortable clothing (specific to your surgery type) to the hospital.  Plan for stool softeners for home use.  If you are being discharged the day of surgery, you will not be allowed to drive home. You will need a responsible adult to drive you home and stay with you that night.   If you are taking public transportation, you will need to have a responsible adult with you. Please confirm with your physician that it is acceptable to use public transportation.   Please call [phone] if you have any questions about these instructions.

## 2019-11-28 ENCOUNTER — Encounter
Admission: RE | Admit: 2019-11-28 | Discharge: 2019-11-28 | Disposition: A | Discharge status: Home / Self Care | Payer: Medicare Other | Source: Ambulatory Visit | Attending: Pulmonary Disease | Admitting: Pulmonary Disease

## 2019-11-28 ENCOUNTER — Other Ambulatory Visit: Payer: Self-pay

## 2019-11-28 DIAGNOSIS — Z01812 Encounter for preprocedural laboratory examination: Secondary | ICD-10-CM | POA: Insufficient documentation

## 2019-11-28 DIAGNOSIS — Z01810 Encounter for preprocedural cardiovascular examination: Secondary | ICD-10-CM

## 2019-11-28 DIAGNOSIS — I10 Essential (primary) hypertension: Secondary | ICD-10-CM | POA: Insufficient documentation

## 2019-11-28 LAB — CBC
HCT: 40.8 % (ref 36.0–46.0)
Hemoglobin: 13.6 g/dL (ref 12.0–15.0)
MCH: 30.8 pg (ref 26.0–34.0)
MCHC: 33.3 g/dL (ref 30.0–36.0)
MCV: 92.5 fL (ref 80.0–100.0)
Platelets: 333 10*3/uL (ref 150–400)
RBC: 4.41 MIL/uL (ref 3.87–5.11)
RDW: 12.3 % (ref 11.5–15.5)
WBC: 7.3 10*3/uL (ref 4.0–10.5)
nRBC: 0 % (ref 0.0–0.2)

## 2019-11-28 LAB — APTT: aPTT: 28 seconds (ref 24–36)

## 2019-11-28 LAB — PROTIME-INR
INR: 1 (ref 0.8–1.2)
Prothrombin Time: 12.7 seconds (ref 11.4–15.2)

## 2019-11-30 ENCOUNTER — Other Ambulatory Visit: Payer: Self-pay

## 2019-11-30 ENCOUNTER — Other Ambulatory Visit
Admission: RE | Admit: 2019-11-30 | Discharge: 2019-11-30 | Disposition: A | Discharge status: Home / Self Care | Payer: Medicare Other | Source: Ambulatory Visit | Attending: Pulmonary Disease | Admitting: Pulmonary Disease

## 2019-11-30 DIAGNOSIS — Z01812 Encounter for preprocedural laboratory examination: Secondary | ICD-10-CM | POA: Insufficient documentation

## 2019-11-30 DIAGNOSIS — Z20822 Contact with and (suspected) exposure to covid-19: Secondary | ICD-10-CM | POA: Insufficient documentation

## 2019-11-30 LAB — SARS CORONAVIRUS 2 (TAT 6-24 HRS): SARS Coronavirus 2: NEGATIVE

## 2019-12-02 ENCOUNTER — Ambulatory Visit: Payer: Medicare Other | Admitting: Certified Registered Nurse Anesthetist

## 2019-12-02 ENCOUNTER — Encounter
Admission: RE | Disposition: A | Discharge status: Home / Self Care | Payer: Self-pay | Source: Ambulatory Visit | Attending: Pulmonary Disease

## 2019-12-02 ENCOUNTER — Other Ambulatory Visit: Payer: Self-pay | Admitting: Pulmonary Disease

## 2019-12-02 ENCOUNTER — Ambulatory Visit
Admission: RE | Admit: 2019-12-02 | Discharge: 2019-12-02 | Disposition: A | Discharge status: Home / Self Care | Payer: Medicare Other | Source: Ambulatory Visit | Attending: Pulmonary Disease | Admitting: Pulmonary Disease

## 2019-12-02 ENCOUNTER — Other Ambulatory Visit (HOSPITAL_COMMUNITY): Payer: Self-pay | Admitting: Pulmonary Disease

## 2019-12-02 ENCOUNTER — Encounter: Admission: RE | Payer: Self-pay | Source: Home / Self Care

## 2019-12-02 ENCOUNTER — Ambulatory Visit: Admission: RE | Admit: 2019-12-02 | Payer: Medicare Other | Source: Home / Self Care

## 2019-12-02 ENCOUNTER — Ambulatory Visit: Payer: Medicare Other

## 2019-12-02 ENCOUNTER — Other Ambulatory Visit: Payer: Self-pay

## 2019-12-02 ENCOUNTER — Encounter: Payer: Self-pay | Admitting: *Deleted

## 2019-12-02 DIAGNOSIS — R0609 Other forms of dyspnea: Secondary | ICD-10-CM | POA: Insufficient documentation

## 2019-12-02 DIAGNOSIS — C3432 Malignant neoplasm of lower lobe, left bronchus or lung: Secondary | ICD-10-CM | POA: Insufficient documentation

## 2019-12-02 DIAGNOSIS — Z882 Allergy status to sulfonamides status: Secondary | ICD-10-CM | POA: Insufficient documentation

## 2019-12-02 DIAGNOSIS — R911 Solitary pulmonary nodule: Secondary | ICD-10-CM

## 2019-12-02 DIAGNOSIS — Z880 Allergy status to penicillin: Secondary | ICD-10-CM | POA: Diagnosis not present

## 2019-12-02 DIAGNOSIS — E119 Type 2 diabetes mellitus without complications: Secondary | ICD-10-CM | POA: Insufficient documentation

## 2019-12-02 DIAGNOSIS — R918 Other nonspecific abnormal finding of lung field: Secondary | ICD-10-CM | POA: Diagnosis present

## 2019-12-02 DIAGNOSIS — I700 Atherosclerosis of aorta: Secondary | ICD-10-CM | POA: Diagnosis not present

## 2019-12-02 DIAGNOSIS — J449 Chronic obstructive pulmonary disease, unspecified: Secondary | ICD-10-CM | POA: Insufficient documentation

## 2019-12-02 DIAGNOSIS — F17210 Nicotine dependence, cigarettes, uncomplicated: Secondary | ICD-10-CM | POA: Diagnosis not present

## 2019-12-02 HISTORY — PX: VIDEO BRONCHOSCOPY WITH ENDOBRONCHIAL NAVIGATION: SHX6175

## 2019-12-02 HISTORY — PX: VIDEO BRONCHOSCOPY WITH ENDOBRONCHIAL ULTRASOUND: SHX6177

## 2019-12-02 LAB — GLUCOSE, CAPILLARY
Glucose-Capillary: 207 mg/dL — ABNORMAL HIGH (ref 70–99)
Glucose-Capillary: 144 mg/dL — ABNORMAL HIGH (ref 70–99)

## 2019-12-02 SURGERY — BRONCHOSCOPY, WITH EBUS
Anesthesia: General

## 2019-12-02 MED ORDER — PROPOFOL 10 MG/ML IV BOLUS
INTRAVENOUS | Status: AC
  Filled 2019-12-02: qty 20

## 2019-12-02 MED ORDER — PROPOFOL 500 MG/50ML IV EMUL
INTRAVENOUS | Status: AC
  Filled 2019-12-02: qty 50

## 2019-12-02 MED ORDER — PROPOFOL 500 MG/50ML IV EMUL
INTRAVENOUS | Status: DC | PRN
  Administered 2019-12-02: 100 ug/kg/min via INTRAVENOUS

## 2019-12-02 MED ORDER — PROPOFOL 10 MG/ML IV BOLUS
INTRAVENOUS | Status: DC | PRN
  Administered 2019-12-02: 30 mg via INTRAVENOUS
  Administered 2019-12-02: 140 mg via INTRAVENOUS

## 2019-12-02 MED ORDER — BUTAMBEN-TETRACAINE-BENZOCAINE 2-2-14 % EX AERO
1.0000 | INHALATION_SPRAY | Freq: Once | CUTANEOUS | Status: DC
  Filled 2019-12-02: qty 20

## 2019-12-02 MED ORDER — LIDOCAINE HCL URETHRAL/MUCOSAL 2 % EX GEL
1.0000 "application " | Freq: Once | CUTANEOUS | Status: DC
  Filled 2019-12-02: qty 5

## 2019-12-02 MED ORDER — ONDANSETRON HCL 4 MG/2ML IJ SOLN: 4.0000 mg | Freq: Once | INTRAMUSCULAR | Status: DC | PRN

## 2019-12-02 MED ORDER — IOHEXOL 300 MG/ML  SOLN
75.0000 mL | Freq: Once | INTRAMUSCULAR | Status: AC | PRN
  Administered 2019-12-02: 11:00:00 75 mL via INTRAVENOUS

## 2019-12-02 MED ORDER — ROCURONIUM BROMIDE 100 MG/10ML IV SOLN
INTRAVENOUS | Status: DC | PRN
  Administered 2019-12-02: 40 mg via INTRAVENOUS
  Administered 2019-12-02: 10 mg via INTRAVENOUS

## 2019-12-02 MED ORDER — LIDOCAINE HCL (CARDIAC) PF 100 MG/5ML IV SOSY
PREFILLED_SYRINGE | INTRAVENOUS | Status: DC | PRN
  Administered 2019-12-02: 80 mg via INTRAVENOUS

## 2019-12-02 MED ORDER — DEXAMETHASONE SODIUM PHOSPHATE 10 MG/ML IJ SOLN
INTRAMUSCULAR | Status: DC | PRN
  Administered 2019-12-02: 5 mg via INTRAVENOUS

## 2019-12-02 MED ORDER — SUGAMMADEX SODIUM 200 MG/2ML IV SOLN
INTRAVENOUS | Status: DC | PRN
  Administered 2019-12-02: 150 mg via INTRAVENOUS

## 2019-12-02 MED ORDER — FENTANYL CITRATE (PF) 100 MCG/2ML IJ SOLN: 25.0000 ug | INTRAMUSCULAR | Status: DC | PRN

## 2019-12-02 MED ORDER — ONDANSETRON HCL 4 MG/2ML IJ SOLN
INTRAMUSCULAR | Status: DC | PRN
  Administered 2019-12-02: 4 mg via INTRAVENOUS

## 2019-12-02 MED ORDER — SODIUM CHLORIDE 0.9 % IV SOLN: INTRAVENOUS | Status: DC

## 2019-12-02 MED ORDER — FENTANYL CITRATE (PF) 100 MCG/2ML IJ SOLN
INTRAMUSCULAR | Status: DC | PRN
  Administered 2019-12-02 (×2): 50 ug via INTRAVENOUS

## 2019-12-02 MED ORDER — PHENYLEPHRINE HCL-NACL 10-0.9 MG/250ML-% IV SOLN
INTRAVENOUS | Status: DC | PRN
  Administered 2019-12-02: 25 ug/min via INTRAVENOUS

## 2019-12-02 MED ORDER — LIDOCAINE HCL (PF) 1 % IJ SOLN
30.0000 mL | Freq: Once | INTRAMUSCULAR | Status: DC
  Filled 2019-12-02: qty 30

## 2019-12-02 MED ORDER — PHENYLEPHRINE HCL 0.25 % NA SOLN
1.0000 | Freq: Four times a day (QID) | NASAL | Status: DC | PRN
  Filled 2019-12-02: qty 15

## 2019-12-02 MED ORDER — FENTANYL CITRATE (PF) 100 MCG/2ML IJ SOLN
INTRAMUSCULAR | Status: AC
  Filled 2019-12-02: qty 2

## 2019-12-02 NOTE — Transfer of Care (Signed)
Immediate Anesthesia Transfer of Care Note  Patient: Donna Brennan  Procedure(s) Performed: VIDEO BRONCHOSCOPY WITH ENDOBRONCHIAL ULTRASOUND (N/A ) VIDEO BRONCHOSCOPY WITH ENDOBRONCHIAL NAVIGATION (N/A )  Patient Location: PACU  Anesthesia Type:General  Level of Consciousness: awake, alert  and patient cooperative  Airway & Oxygen Therapy: Patient Spontanous Breathing  Post-op Assessment: Report given to RN, Post -op Vital signs reviewed and stable and Patient moving all extremities  Post vital signs: Reviewed and stable  Last Vitals:  Vitals Value Taken Time  BP 148/63 12/02/19 1444  Temp 36 C 12/02/19 1444  Pulse 69 12/02/19 1452  Resp 14 12/02/19 1452  SpO2 95 % 12/02/19 1452  Vitals shown include unvalidated device data.  Last Pain:  Vitals:   12/02/19 1444  TempSrc:   PainSc: 0-No pain         Complications: No apparent anesthesia complications

## 2019-12-02 NOTE — Procedures (Signed)
ELECTROMAGNETIC NAVIGATIONAL BRONCHOSCOPY PROCEDURE NOTE  FLEXIBLE BRONCHOSCOPY WITH AIRWAY INSPECTION AND BAL PROCEDURE NOTE  ENDOBRONCHIAL ULTRASOUND PROCEDURE NOTE    Flexible bronchoscopy was performed on December 02, 2019 by : Lanney Gins MD  assistance by : 1) Frederich Cha, RT and 2) anesthesia team, #3 [place] staff, #4 fluoroscopy team   Indication for the procedure was :  Pre-procedural H&P. The following assessment was performed on the day of the procedure prior to initiating sedation History:  Chest pain none Dyspnea on exertion only Hemoptysis none Cough intermittent Fever none Other pertinent items none  Examination Vital signs -reviewed as per nursing documentation today Cardiac    Murmurs: None  Rubs : None  Gallop: None Lungs Wheezing: None Rales : None Rhonchi {: Mild at the left lung  Other pertinent findings: None  Pre-procedural assessment for Procedural Sedation included: Depth of sedation: As per anesthesia team  ASA Classification: 2 Mallampati airway assessment: 2    Medication list reviewed: Yes  The patient's interval history was taken and revealed: no new complaints The pre- procedure physical examination revealed: No new findings Refer to prior clinic note for details.  Informed Consent: Informed consent was obtained from:  patient after explanation of procedure and risks, benefits, as well as alternative procedures available.  Explanation of level of sedation and possible transfusion was also provided.    Procedural Preparation: Time out was performed and patient was identified by name and birthdate and procedure to be performed and side for sampling, if any, was specified. Pt was intubated by anesthesia.  The patient was appropriately draped.  Flexible bronchoscopy with airway inspection and bronchoalveolar lavage procedure Findings: Bronchoscope was inserted via ETT  without difficulty.  Posterior oropharynx, epiglottis,  arytenoids, false cords and vocal cords were not visualized as these were bypassed by endotracheal tube.   The distal trachea was normal in circumference and appearance without mucosal, cartilaginous or branching abnormalities.  The main carina was mildly splayed.   All right and left lobar airways were visualized to the Sub-segmental level.  Sub- sub segmental carinae were identified in all the distal airways.   Secretions were visible in the following airways and appeared to be phlegm with particles of thickened mucoid material.  The mucosa was : Friable at the left lower lobe  Airways were notable for:        exophytic lesions : None       extrinsic compression in the following distributions: Left lower lobe mucosal edema with 20% narrowing of left lower lobe anteromedial segment       Friable mucosa: Left lower lobe       Anthrocotic material /pigmentation: None   Pictorial documentation attached: N/AA  Post procedure findings:-Airway inspection normal throughout on the right lung and left lung with mucosal edema and friable mucosa of the left lower lobe and partial extrinsic compression of the anteromedial segment     Electromagnetic navigational bronchoscopy procedure findings: After appropriate planning and registration phase LG was advanced via CT-guided for full view to localize target lesion at the left lower lobe.  Initially Cytobrush specimens were done at this area x3, [facility] reporting lesional atypical cells on brushings.  Next under fluoroscopic guidance forcep biopsies x3 were performed with lesional atypical cells found as per [employer] staff.  One specimen was placed in saline remainder in formalin for surgical pathology evaluation. 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 Examination began to evaluate for  pathologically enlarged lymph nodes starting on the right side side progressing to the left.All lymph node biopsies performed with Olympus 21-gauge needle. Lymph node biopsies were sent in cytolite for all biopsied stations.  Initially station 10 R was evaluated and was found to be 1.1 cm, this lymph node was biopsied 3 times with good lymphoid shedding and without any atypical cells found.  Next station 7 was evaluated was found to be 1.2 cm was biopsied 4 times with lymphoid shedding and without atypia.  Next station 10 L was localized under ultrasound guidance measured to be 1.4 cm was biopsied 3 times with good lymphoid shedding and without atypical cells found. Post procedure findings: Normal lymph node tissue    Specimens obtained included:              Cytology brushes : X3 at left lower lobe sent for cytology  Broncho-alveolar lavage site: Left lower lobe sent for cytology                             40 ml volume infused 20 ml volume returned with serosanguineous appearance  Endobronchial biopsy site: Left lower lobe; sent for surgical pathology       Fluoroscopy Used: Yes;        Pictorial documentation attached: None                EBUS assisted lymph node biopsies with 21-gauge Olympus needle: Station 10 R, station 7, station 10 L sent for: Cytology                                      Immediate sampling complications included: None Epinephrine 0 ml was used topically  The bronchoscopy was terminated due to completion of the planned procedure and the bronchoscope was removed.   Total dosage of Lidocaine was 0 mg Total fluoroscopy time was 2 minutes    Estimated Blood loss: 1-2 cc.  Complications included: Immediate   Disposition: Home with daughter [name]  Follow up with Dr. Lanney Gins in 7 days for result discussion.   Claudette Stapler MD  [place] Division of Pulmonary & Critical Care Medicine

## 2019-12-02 NOTE — H&P (Signed)
Pulmonary Medicine          Date: 12/02/2019,   MRN# [id number] Donna Brennan December 14, 1945     Admission                  Current       CHIEF COMPLAINT:   Left lung mass   HISTORY OF PRESENT ILLNESS   This is a 74 year old female initially evaluated in pulmonary clinic at [facility].  She has a history of COPD with centrilobular emphysema.  She is a lifelong smoker.  She does use inhalers including Advair and is compliant with her therapy.  She has reported cough and phlegm on expectoration.  Currently her mMRC is between 2-3.  She had a CT chest in December 2020 which showed a 9 mm nodule which has been increasing in size.  We did discuss doing biopsy due to report of a lung RADS category 4 suspicious for possible adeno carcinoma.  Patient wanted to proceed with electro magnetic navigational bronchoscopy after full discussion regarding options including risk-benefit discourse.  Further evaluation of nodule appeared possible communication with arterial blood flow however further review and discussion with radiologist and repeat CT with contrast super D protocol shows no communication with arterial blood and therefore decision to proceed with biopsy was made.  I had evaluated patient prior to procedure no new changes we had again discussed procedure and risk and benefit.    PAST MEDICAL HISTORY   Past Medical History:  Diagnosis Date  . Colon polyps   . COPD (chronic obstructive pulmonary disease) ([place])   . Diabetes mellitus without complication ([place])   . GERD (gastroesophageal reflux disease)   . Headache   . Hyperlipidemia   . IBS (irritable bowel syndrome)   . Peptic ulcer   . Tobacco abuse      SURGICAL HISTORY   Past Surgical History:  Procedure Laterality Date  . ABDOMINAL HYSTERECTOMY    . carbuncle removal     urethra  . COLONOSCOPY    . COLONOSCOPY WITH PROPOFOL N/A 11/05/2017   Procedure: COLONOSCOPY WITH PROPOFOL;  Surgeon: Lollie Sails, MD;   Location: [facility] ENDOSCOPY;  Service: Endoscopy;  Laterality: N/A;  . ESOPHAGOGASTRODUODENOSCOPY    . ESOPHAGOGASTRODUODENOSCOPY (EGD) WITH PROPOFOL N/A 11/05/2017   Procedure: ESOPHAGOGASTRODUODENOSCOPY (EGD) WITH PROPOFOL;  Surgeon: Lollie Sails, MD;  Location[facility] ENDOSCOPY;  Service: Endoscopy;  Laterality: N/A;     FAMILY HISTORY   History reviewed. No pertinent family history.   SOCIAL HISTORY   Social History   Tobacco Use  . Smoking status: Current Some Day Smoker    Packs/day: 0.50    Years: 45.00    Pack years: 22.50    Types: Cigarettes  . Smokeless tobacco: Never Used  Substance Use Topics  . Alcohol use: No  . Drug use: No     MEDICATIONS    Home Medication:    Current Medication:  Current Facility-Administered Medications:  .  0.9 %  sodium chloride infusion, , Intravenous, Continuous, Ramsdell, Brett Canales, MD .  butamben-tetracaine-benzocaine (CETACAINE) spray 1 spray, 1 spray, Topical, Once, Jashae Wiggs, MD .  lidocaine (PF) (XYLOCAINE) 1 % injection 30 mL, 30 mL, Infiltration, Once, Maan Zarcone, MD .  lidocaine (XYLOCAINE) 2 % jelly 1 application, 1 application, Topical, Once, Ishaq Maffei, MD .  phenylephrine (NEO-SYNEPHRINE) 0.25 % nasal spray 1 spray, 1 spray, Each Nare, Q6H PRN, Ottie Glazier, MD    ALLERGIES   Penicillins, Metformin, and  Sulfa antibiotics     REVIEW OF SYSTEMS    Review of Systems:  Gen:  Denies  fever, sweats, chills weigh loss  HEENT: Denies blurred vision, double vision, ear pain, eye pain, hearing loss, nose bleeds, sore throat Cardiac:  No dizziness, chest pain or heaviness, chest tightness,edema Resp:   Denies cough or sputum porduction, shortness of breath,wheezing, hemoptysis,  Gi: Denies swallowing difficulty, stomach pain, nausea or vomiting, diarrhea, constipation, bowel incontinence Gu:  Denies bladder incontinence, burning urine Ext:   Denies Joint pain, stiffness or swelling Skin:  Denies  skin rash, easy bruising or bleeding or hives Endoc:  Denies polyuria, polydipsia , polyphagia or weight change Psych:   Denies depression, insomnia or hallucinations   Other:  All other systems negative   VS: BP 139/68   Pulse 65   Temp (!) 96.7 F (35.9 C) (Tympanic)   Resp 16   SpO2 98%      PHYSICAL EXAM    GENERAL:NAD, no fevers, chills, no weakness no fatigue HEAD: Normocephalic, atraumatic.  EYES: Pupils equal, round, reactive to light. Extraocular muscles intact. No scleral icterus.  MOUTH: Moist mucosal membrane. Dentition intact. No abscess noted.  EAR, NOSE, THROAT: Clear without exudates. No external lesions.  NECK: Supple. No thyromegaly. No nodules. No JVD.  PULMONARY: Clear to auscultation bilaterally CARDIOVASCULAR: S1 and S2. Regular rate and rhythm. No murmurs, rubs, or gallops. No edema. Pedal pulses 2+ bilaterally.  GASTROINTESTINAL: Soft, nontender, nondistended. No masses. Positive bowel sounds. No hepatosplenomegaly.  MUSCULOSKELETAL: No swelling, clubbing, or edema. Range of motion full in all extremities.  NEUROLOGIC: Cranial nerves II through XII are intact. No gross focal neurological deficits. Sensation intact. Reflexes intact.  SKIN: No ulceration, lesions, rashes, or cyanosis. Skin warm and dry. Turgor intact.  PSYCHIATRIC: Mood, affect within normal limits. The patient is awake, alert and oriented x 3. Insight, judgment intact.       IMAGING    CT Super D Chest W Contrast  Result Date: 12/02/2019 CLINICAL DATA:  LEFT lower lobe pulmonary nodule. Former smoker. EXAM: CT CHEST WITH CONTRAST TECHNIQUE: Multidetector CT imaging of the chest was performed using thin slice collimation for electromagnetic bronchoscopy planning purposes, with intravenous contrast. CONTRAST:  67mL OMNIPAQUE IOHEXOL 300 MG/ML  SOLN COMPARISON:  None. FINDINGS: Cardiovascular: Coronary artery calcification and aortic atherosclerotic calcification.  Mediastinum/Nodes: No axillary supraclavicular adenopathy. No mediastinal hilar adenopathy. No pericardial effusion. Lungs/Pleura: Multilobular nodule in the central LEFT lower lobe measures 15 mm x 9 mm (image 32/3). Lesion new from CT 01/21/2018 increased in size from CT 03/10/2019 where largest nodule measured 5 mm. There is no communication with the pulmonary artery appreciated. No additional pulmonary nodules present. No centrilobular emphysema. Upper Abdomen: Limited view of the liver, kidneys, pancreas are unremarkable. Normal adrenal glands. Benign low-density cysts in the LEFT hepatic lobe noted. Musculoskeletal: No aggressive osseous lesion IMPRESSION: 1. Multilobular nodule in the LEFT lower lobe. Lesion new from 01/21/2018 and increased in size from 03/10/2019. No communication with the pulmonary artery appreciated. 2. No mediastinal lymphadenopathy. 3. Aortic Atherosclerosis (ICD10-I70.0). Electronically Signed   By: Suzy Bouchard M.D.   On: 12/02/2019 11:44      ASSESSMENT/PLAN   Left lung lower lobe 15 mm nodule suspicious for carcinoma   -Discussed electromagnetic navigational bronchoscopy as well as EBUS assisted lymph node staging   -Patient understands risks including bleeding, infection, pneumomediastinum/pneumothorax which may require chest tube insertion and mechanical ventilation, and very rarely death.  Patient is agreeable and  wishes to proceed -Daughter Donna Brennan is with her today and is waiting for her post procedure.      Thank you for allowing me to participate in the care of this patient.   was  spent in counseling and coordination of care.   Patient/Family are satisfied with care plan and all questions have been answered.  This document was prepared using Dragon voice recognition software and may include unintentional dictation errors.     Ottie Glazier, M.D.  Division of [place]

## 2019-12-02 NOTE — Anesthesia Procedure Notes (Signed)
Procedure Name: Intubation Date/Time: 12/02/2019 1:06 PM Performed by: Lowry Bowl, CRNA Pre-anesthesia Checklist: Patient identified, Emergency Drugs available, Suction available, Patient being monitored and Timeout performed Patient Re-evaluated:Patient Re-evaluated prior to induction Oxygen Delivery Method: Circle system utilized Preoxygenation: Pre-oxygenation with 100% oxygen Induction Type: IV induction and Cricoid Pressure applied Ventilation: Mask ventilation without difficulty Laryngoscope Size: Mac and 3 Grade View: Grade II Tube type: Oral Tube size: 8.5 mm Number of attempts: 1 Airway Equipment and Method: Stylet Placement Confirmation: ETT inserted through vocal cords under direct vision,  positive ETCO2 and breath sounds checked- equal and bilateral Secured at: 21 cm Tube secured with: Tape Dental Injury: Teeth and Oropharynx as per pre-operative assessment

## 2019-12-02 NOTE — Discharge Instructions (Addendum)
AMBULATORY SURGERY  DISCHARGE INSTRUCTIONS   1) The drugs that you were given will stay in your system until tomorrow so for the next 24 hours you should not:  A) Drive an automobile B) Make any legal decisions C) Drink any alcoholic beverage   2) You may resume regular meals tomorrow.  Today it is better to start with liquids and gradually work up to solid foods.  You may eat anything you prefer, but it is better to start with liquids, then soup and crackers, and gradually work up to solid foods.   3) Please notify your doctor immediately if you have any unusual bleeding, trouble breathing, redness and pain at the surgery site, drainage, fever, or pain not relieved by medication.    4) Additional Instructions:   Please contact your physician with any problems or Same Day Surgery at [phone], Monday through Friday 6 am to 4 pm, or [place] at [facility] number at [phone].    Flexible Bronchoscopy, Care After This sheet gives you information about how to care for yourself after your test. Your doctor may also give you more specific instructions. If you have problems or questions, contact your doctor. Follow these instructions at home: Eating and drinking  Do not eat or drink anything (not even water) for 2 hours after your test, or until your numbing medicine (local anesthetic) wears off.  When your numbness is gone and your cough and gag reflexes have come back, you may: ? Eat only soft foods. ? Slowly drink liquids.  The day after the test, go back to your normal diet. Driving  Do not drive for 24 hours if you were given a medicine to help you relax (sedative).  Do not drive or use heavy machinery while taking prescription pain medicine. General instructions   Take over-the-counter and prescription medicines only as told by your doctor.  Return to your normal activities as told. Ask what activities are safe for you.  Do not use any products that have  nicotine or tobacco in them. This includes cigarettes and e-cigarettes. If you need help quitting, ask your doctor.  Keep all follow-up visits as told by your doctor. This is important. It is very important if you had a tissue sample (biopsy) taken. Get help right away if:  You have shortness of breath that gets worse.  You get light-headed.  You feel like you are going to pass out (faint).  You have chest pain.  You cough up: ? More than a little blood. ? More blood than before. Summary  Do not eat or drink anything (not even water) for 2 hours after your test, or until your numbing medicine wears off.  Do not use cigarettes. Do not use e-cigarettes.  Get help right away if you have chest pain. This information is not intended to replace advice given to you by your health care provider. Make sure you discuss any questions you have with your health care provider. Document Revised: 09/04/2017 Document Reviewed: 10/10/2016 Elsevier Patient Education  2020 [employer].

## 2019-12-02 NOTE — Anesthesia Preprocedure Evaluation (Signed)
Anesthesia Evaluation  Patient identified by MRN, date of birth, ID band Patient awake    Reviewed: Allergy & Precautions, H&P , NPO status , Patient's Chart, lab work & pertinent test results, reviewed documented beta blocker date and time   Airway Mallampati: II  TM Distance: >3 FB Neck ROM: full    Dental  (+) Teeth Intact   Pulmonary COPD, Current Smoker and Patient abstained from smoking.,    Pulmonary exam normal        Cardiovascular Exercise Tolerance: Poor negative cardio ROS Normal cardiovascular exam Rhythm:regular Rate:Normal     Neuro/Psych  Headaches, negative psych ROS   GI/Hepatic Neg liver ROS, PUD, GERD  ,  Endo/Other  negative endocrine ROSdiabetes  Renal/GU negative Renal ROS  negative genitourinary   Musculoskeletal   Abdominal   Peds  Hematology negative hematology ROS (+)   Anesthesia Other Findings Past Medical History: No date: Colon polyps No date: COPD (chronic obstructive pulmonary disease) ([facility]) No date: Diabetes mellitus without complication ([facility]) No date: GERD (gastroesophageal reflux disease) No date: Headache No date: Hyperlipidemia No date: IBS (irritable bowel syndrome) No date: Peptic ulcer No date: Tobacco abuse Past Surgical History: No date: ABDOMINAL HYSTERECTOMY No date: carbuncle removal     Comment:  urethra No date: COLONOSCOPY 11/05/2017: COLONOSCOPY WITH PROPOFOL; N/A     Comment:  Procedure: COLONOSCOPY WITH PROPOFOL;  Surgeon:               Lollie Sails, MD;  Location: [facility] ENDOSCOPY;                Service: Endoscopy;  Laterality: N/A; No date: ESOPHAGOGASTRODUODENOSCOPY 11/05/2017: ESOPHAGOGASTRODUODENOSCOPY (EGD) WITH PROPOFOL; N/A     Comment:  Procedure: ESOPHAGOGASTRODUODENOSCOPY (EGD) WITH               PROPOFOL;  Surgeon: Lollie Sails, MD;  Location:               [facility] ENDOSCOPY;  Service: Endoscopy;  Laterality: N/A;   Reproductive/Obstetrics negative OB ROS                             Anesthesia Physical Anesthesia Plan  ASA: III  Anesthesia Plan: General ETT   Post-op Pain Management:    Induction:   PONV Risk Score and Plan:   Airway Management Planned:   Additional Equipment:   Intra-op Plan:   Post-operative Plan:   Informed Consent: I have reviewed the patients History and Physical, chart, labs and discussed the procedure including the risks, benefits and alternatives for the proposed anesthesia with the patient or authorized representative who has indicated his/her understanding and acceptance.     Dental Advisory Given  Plan Discussed with: CRNA  Anesthesia Plan Comments:         Anesthesia Quick Evaluation

## 2019-12-05 DIAGNOSIS — J441 Chronic obstructive pulmonary disease with (acute) exacerbation: Secondary | ICD-10-CM | POA: Insufficient documentation

## 2019-12-07 LAB — CYTOLOGY - NON PAP

## 2019-12-07 NOTE — Anesthesia Postprocedure Evaluation (Signed)
Anesthesia Post Note  Patient: Donna Brennan  Procedure(s) Performed: VIDEO BRONCHOSCOPY WITH ENDOBRONCHIAL ULTRASOUND (N/A ) VIDEO BRONCHOSCOPY WITH ENDOBRONCHIAL NAVIGATION (N/A )  Patient location during evaluation: PACU Anesthesia Type: General Level of consciousness: awake and alert Pain management: pain level controlled Vital Signs Assessment: post-procedure vital signs reviewed and stable Respiratory status: spontaneous breathing, nonlabored ventilation, respiratory function stable and patient connected to nasal cannula oxygen Cardiovascular status: blood pressure returned to baseline and stable Postop Assessment: no apparent nausea or vomiting Anesthetic complications: no     Last Vitals:  Vitals:   12/02/19 1530 12/02/19 1547  BP: (!) 151/75 (!) 141/62  Pulse: 72 71  Resp: 16 16  Temp: (!) 36.3 C   SpO2: 96% 99%    Last Pain:  Vitals:   12/05/19 0817  TempSrc:   PainSc: 9                  Molli Barrows

## 2019-12-08 ENCOUNTER — Telehealth: Payer: Self-pay | Admitting: Internal Medicine

## 2019-12-08 NOTE — Telephone Encounter (Signed)
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  Awaiting final pathology.  Dr. Loni Muse- awaiting to speak to patient regarding results.  Patient needs follow-up in the next week.  Hayley-please talk to me

## 2019-12-09 ENCOUNTER — Other Ambulatory Visit: Payer: Self-pay | Admitting: *Deleted

## 2019-12-09 LAB — SURGICAL PATHOLOGY

## 2019-12-09 LAB — CYTOLOGY - NON PAP

## 2019-12-13 ENCOUNTER — Encounter: Payer: Self-pay | Admitting: Radiation Oncology

## 2019-12-13 ENCOUNTER — Other Ambulatory Visit: Payer: Self-pay

## 2019-12-13 DIAGNOSIS — I10 Essential (primary) hypertension: Secondary | ICD-10-CM | POA: Insufficient documentation

## 2019-12-14 ENCOUNTER — Ambulatory Visit
Admission: RE | Admit: 2019-12-14 | Discharge: 2019-12-14 | Disposition: A | Discharge status: Home / Self Care | Payer: Medicare Other | Source: Ambulatory Visit | Attending: Radiation Oncology | Admitting: Radiation Oncology

## 2019-12-14 ENCOUNTER — Encounter: Payer: Self-pay | Admitting: Radiation Oncology

## 2019-12-14 VITALS — BP 137/78 | HR 87 | Temp 97.5°F | Resp 18 | Wt 146.0 lb

## 2019-12-14 DIAGNOSIS — Z86010 Personal history of colonic polyps: Secondary | ICD-10-CM | POA: Insufficient documentation

## 2019-12-14 DIAGNOSIS — F17210 Nicotine dependence, cigarettes, uncomplicated: Secondary | ICD-10-CM | POA: Insufficient documentation

## 2019-12-14 DIAGNOSIS — E785 Hyperlipidemia, unspecified: Secondary | ICD-10-CM | POA: Diagnosis not present

## 2019-12-14 DIAGNOSIS — K219 Gastro-esophageal reflux disease without esophagitis: Secondary | ICD-10-CM | POA: Diagnosis not present

## 2019-12-14 DIAGNOSIS — K589 Irritable bowel syndrome without diarrhea: Secondary | ICD-10-CM | POA: Diagnosis not present

## 2019-12-14 DIAGNOSIS — Z79899 Other long term (current) drug therapy: Secondary | ICD-10-CM | POA: Diagnosis not present

## 2019-12-14 DIAGNOSIS — Z794 Long term (current) use of insulin: Secondary | ICD-10-CM | POA: Insufficient documentation

## 2019-12-14 DIAGNOSIS — J449 Chronic obstructive pulmonary disease, unspecified: Secondary | ICD-10-CM | POA: Diagnosis not present

## 2019-12-14 DIAGNOSIS — E119 Type 2 diabetes mellitus without complications: Secondary | ICD-10-CM | POA: Diagnosis not present

## 2019-12-14 DIAGNOSIS — R911 Solitary pulmonary nodule: Secondary | ICD-10-CM | POA: Diagnosis not present

## 2019-12-14 NOTE — Consult Note (Signed)
NEW PATIENT EVALUATION  Name: Donna Brennan  MRN: [id number]  Date:   12/14/2019     DOB: 03-10-1946   This 74 y.o. 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  REFERRING PHYSICIAN: Adin Hector, MD  CHIEF COMPLAINT:  Chief Complaint  Patient presents with  . lung nodule    DIAGNOSIS: The encounter diagnosis was Lung nodule.   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  She is being followed for left midlung field lesion first presenting on a CT scan back in June 2020 showing a new clustered nodularity in the central left lower lobe measuring up to 5.3 mm.  Repeat scan 6 months later showed this lesion growing to 9.6 mm.  Initially had a micro nodular appearance which has coalesced into dominant lobular nodule on today's study which is a typical presentation for neoplasm.  PET CT scan in December showed 50 mm pulmonary nodule showing low-grade FDG uptake low-grade adenocarcinoma cannot be excluded.  Patient in late February underwent ultrasound-guided endobronchial examination with brushings performed which were highly suspicious for malignancy.  At the time of bronchoscopy there was friable mucosa in the left lower lobe and partial extrinsic compression of the anterior medial segment.  Patient is fairly asymptomatic she does have symptoms of COPD.  She specifically denies cough hemoptysis or chest tightness.  Again patient cytology from the left lower lobe was consistent with at least high-grade squamous dysplasia and suspicious for malignancy.  At this time patient is seen for consultation.  PLANNED TREATMENT REGIMEN: Hypofractionated radiation therapy to her left lung possibly SBRT  PAST MEDICAL  HISTORY:  has a past medical history of Colon polyps, COPD (chronic obstructive pulmonary disease) ([place]), Diabetes mellitus without complication ([place]), GERD (gastroesophageal reflux disease), Headache, Hyperlipidemia, IBS (irritable bowel syndrome), Peptic ulcer, and Tobacco abuse.    PAST SURGICAL HISTORY:  Past Surgical History:  Procedure Laterality Date  . ABDOMINAL HYSTERECTOMY    . carbuncle removal     urethra  . COLONOSCOPY    . COLONOSCOPY WITH PROPOFOL N/A 11/05/2017   Procedure: COLONOSCOPY WITH PROPOFOL;  Surgeon: Lollie Sails, MD;  Location: [facility] ENDOSCOPY;  Service: Endoscopy;  Laterality: N/A;  . ESOPHAGOGASTRODUODENOSCOPY    . ESOPHAGOGASTRODUODENOSCOPY (EGD) WITH PROPOFOL N/A 11/05/2017   Procedure: ESOPHAGOGASTRODUODENOSCOPY (EGD) WITH PROPOFOL;  Surgeon: Lollie Sails, MD;  Location: [facility] ENDOSCOPY;  Service: Endoscopy;  Laterality: N/A;  . VIDEO BRONCHOSCOPY WITH ENDOBRONCHIAL NAVIGATION N/A 12/02/2019   Procedure: VIDEO BRONCHOSCOPY WITH ENDOBRONCHIAL NAVIGATION;  Surgeon: Ottie Glazier, MD;  Location: [facility] ORS;  Service: Thoracic;  Laterality: N/A;  . VIDEO BRONCHOSCOPY WITH ENDOBRONCHIAL ULTRASOUND N/A 12/02/2019   Procedure: VIDEO BRONCHOSCOPY WITH ENDOBRONCHIAL ULTRASOUND;  Surgeon: Ottie Glazier, MD;  Location: [facility] ORS;  Service: Thoracic;  Laterality: N/A;    FAMILY HISTORY: family history is not on file.  SOCIAL HISTORY:  reports that she has been smoking cigarettes. She has a 22.50 pack-year smoking history. She has never used smokeless tobacco. She reports that she does not drink alcohol or use drugs.  ALLERGIES: Penicillins, Metformin, and Sulfa antibiotics  MEDICATIONS:  Current Outpatient Medications  Medication Sig Dispense Refill  . acetaminophen (TYLENOL) 500 MG tablet Take 1,000 mg by mouth every 6 (six) hours  as needed (for pain.).     Marland Kitchen albuterol (PROVENTIL HFA;VENTOLIN HFA) 108 (90 Base) MCG/ACT inhaler Inhale 1-2 puffs into the lungs  every 6 (six) hours as needed for wheezing or shortness of breath.     . ezetimibe (ZETIA) 10 MG tablet Take 10 mg by mouth daily.     . Fluticasone-Salmeterol (ADVAIR) 250-50 MCG/DOSE AEPB Inhale 1 puff into the lungs at bedtime.     . furosemide (LASIX) 20 MG tablet Take 20 mg by mouth daily as needed (fluid retention.).     Marland Kitchen hyoscyamine (LEVBID) 0.375 MG 12 hr tablet Take 0.375 mg by mouth daily as needed (abdominal cramping).     . Ipratropium-Albuterol (COMBIVENT) 20-100 MCG/ACT AERS respimat Inhale 1 puff into the lungs every 4 (four) hours as needed for wheezing.    Marland Kitchen LANTUS SOLOSTAR 100 UNIT/ML Solostar Pen Inject 24 Units into the skin at bedtime.    . meloxicam (MOBIC) 7.5 MG tablet Take 7.5 mg by mouth daily as needed for pain.    . Multiple Vitamin (MULTIVITAMIN WITH MINERALS) TABS tablet Take 1 tablet by mouth daily. One-A-Day for Women    . nicotine polacrilex (NICORETTE) 4 MG gum Take 4 mg by mouth in the morning, at noon, and at bedtime.    Marland Kitchen nystatin (MYCOSTATIN) 100000 UNIT/ML suspension Take 5 mLs by mouth 2 (two) times daily as needed (throat irritation.).    Marland Kitchen omeprazole (PRILOSEC) 20 MG capsule Take 20 mg by mouth daily before breakfast.     . rosuvastatin (CRESTOR) 40 MG tablet Take 40 mg by mouth every evening.     . vitamin B-12 (CYANOCOBALAMIN) 1000 MCG tablet Take 1,000 mcg by mouth daily.     No current facility-administered medications for this encounter.    ECOG PERFORMANCE STATUS:  0 - Asymptomatic  REVIEW OF SYSTEMS: Patient denies any weight loss, fatigue, weakness, fever, chills or night sweats. Patient denies any loss of vision, blurred vision. Patient denies any ringing  of the ears or hearing loss. No irregular heartbeat. Patient denies heart murmur or history of fainting. Patient denies any chest pain or pain radiating to her upper extremities. Patient denies any shortness of breath, difficulty breathing at night, cough or hemoptysis. Patient denies any  swelling in the lower legs. Patient denies any nausea vomiting, vomiting of blood, or coffee ground material in the vomitus. Patient denies any stomach pain. Patient states has had normal bowel movements no significant constipation or diarrhea. Patient denies any dysuria, hematuria or significant nocturia. Patient denies any problems walking, swelling in the joints or loss of balance. Patient denies any skin changes, loss of hair or loss of weight. Patient denies any excessive worrying or anxiety or significant depression. Patient denies any problems with insomnia. Patient denies excessive thirst, polyuria, polydipsia. Patient denies any swollen glands, patient denies easy bruising or easy bleeding. Patient denies any recent infections, allergies or URI. Patient "s visual fields have not changed significantly in recent time.   PHYSICAL EXAM: BP 137/78   Pulse 87   Temp (!) 97.5 F (36.4 C)   Resp 18   Wt 146 lb (66.2 kg)   BMI 27.05 kg/m  Well-developed well-nourished patient in NAD. HEENT reveals PERLA, EOMI, discs not visualized.  Oral cavity is clear. No oral mucosal lesions are identified. Neck is clear without evidence of cervical or supraclavicular adenopathy. Lungs are clear to A&P. Cardiac examination is essentially unremarkable with regular rate and rhythm without murmur rub or thrill. Abdomen is  benign with no organomegaly or masses noted. Motor sensory and DTR levels are equal and symmetric in the upper and lower extremities. Cranial nerves II through XII are grossly intact. Proprioception is intact. No peripheral adenopathy or edema is identified. No motor or sensory levels are noted. Crude visual fields are within normal range.  LABORATORY DATA: Cytology reports reviewed    RADIOLOGY RESULTS: Serial CT scans and PET CT scan reviewed compatible with above-stated findings   IMPRESSION: Probable stage I non-small cell lung cancer of the left lung in 74 year old female  PLAN: At this  time based on the persistent growth of the lesion mild hypermetabolic activity and highly suspicious cytology believe we are dealing with a stage I non-small cell lung cancer.  Have offered hypofractionated course of radiation therapy.  We may be able to do this in 5 fractions although I would predict with our close to the vasculature of the mid chest and would plan on doing 6000 cGy in 10 fractions.  Would use IMRT treatment planning and delivery based on his close proximity to the great vessels heart spinal cord and esophagus and the hypofractionated course of treatment.  Will use for dimensional treatment planning during treatment set-up and delivery as well as motion restriction.  Risks and benefits of treatment including possible development of cough fatigue skin reaction and mild radiation esophagitis all were discussed in detail with the patient and her daughter.  Both seem to comprehend my treatment plan well.  I have personally set up and ordered CT simulation.  I would like to take this opportunity to thank you for allowing me to participate in the care of your patient.Noreene Filbert, MD

## 2019-12-15 ENCOUNTER — Other Ambulatory Visit: Payer: Medicare Other

## 2019-12-15 NOTE — Progress Notes (Signed)
Tumor Board Documentation  Donna Brennan was presented by Dr Rogue Bussing at our Tumor Board on 12/15/2019, which included representatives from medical oncology, radiation oncology, navigation, pathology, radiology, surgical, surgical oncology, research, palliative care.  Donna Brennan currently presents as a current patient, for Donna Brennan with history of the following treatments: surgical intervention(s), active survellience.  Additionally, we reviewed previous medical and familial history, history of present illness, and recent lab results along with all available histopathologic and imaging studies. The tumor board considered available treatment options and made the following recommendations: Surgery Referral to Dr Genevive Bi  The following procedures/referrals were also placed: No orders of the defined types were placed in this encounter.   Clinical Trial Status: not discussed   Staging used: To be determined  AJCC Staging:       Group: Possible Malignancy per biopsy   National site-specific guidelines   were discussed with respect to the case.  Tumor board is a meeting of clinicians from various specialty areas who evaluate and discuss patients for whom a multidisciplinary approach is being considered. Final determinations in the plan of care are those of the provider(s). The responsibility for follow up of recommendations given during tumor board is that of the provider.   Today's extended care, comprehensive team conference, Donna Brennan was not present for the discussion and was not examined.   Multidisciplinary Tumor Board is a multidisciplinary case peer review process.  Decisions discussed in the Multidisciplinary Tumor Board reflect the opinions of the specialists present at the conference without having examined the patient.  Ultimately, treatment and diagnostic decisions rest with the primary provider(s) and the patient.

## 2019-12-18 ENCOUNTER — Telehealth: Payer: Self-pay | Admitting: Internal Medicine

## 2019-12-20 ENCOUNTER — Ambulatory Visit
Admission: RE | Admit: 2019-12-20 | Discharge: 2019-12-20 | Disposition: A | Discharge status: Home / Self Care | Payer: Medicare Other | Source: Ambulatory Visit | Attending: Radiation Oncology | Admitting: Radiation Oncology

## 2019-12-20 DIAGNOSIS — J984 Other disorders of lung: Secondary | ICD-10-CM | POA: Diagnosis present

## 2019-12-20 DIAGNOSIS — Z510 Encounter for antineoplastic radiation therapy: Secondary | ICD-10-CM | POA: Insufficient documentation

## 2019-12-21 ENCOUNTER — Inpatient Hospital Stay: Payer: Medicare Other | Attending: Internal Medicine | Admitting: Internal Medicine

## 2019-12-21 ENCOUNTER — Other Ambulatory Visit: Payer: Self-pay

## 2019-12-21 ENCOUNTER — Encounter: Payer: Self-pay | Admitting: Internal Medicine

## 2019-12-21 DIAGNOSIS — R911 Solitary pulmonary nodule: Secondary | ICD-10-CM | POA: Diagnosis not present

## 2019-12-21 DIAGNOSIS — F17210 Nicotine dependence, cigarettes, uncomplicated: Secondary | ICD-10-CM | POA: Diagnosis not present

## 2019-12-21 DIAGNOSIS — C3432 Malignant neoplasm of lower lobe, left bronchus or lung: Secondary | ICD-10-CM | POA: Diagnosis not present

## 2019-12-21 NOTE — Assessment & Plan Note (Addendum)
#  Clinically stage I non-small cell lung cancer-biopsy-dysplastic cells suspicious of malignancy s/but no invasion noted available limited sample.  Discussed with patient that this is clinically suspicious for invasive cancer; and hence this should be addressed similarly.  #Discussed option of surgery/resection; patient declines.  States that given her age/she is concerned about the recovery/potential complications.   # "MGUS"-after extensive work-up patient not thought to have MGUS.  No further work-up needed.  #Patient to continue follow-up with Dr. Crystal/Dr. Raul Del.  Follow-up with me as needed.  # DISPOSITION: # follow as needed- Dr.B  Cc; [place].

## 2019-12-21 NOTE — Progress Notes (Signed)
[place] NOTE  Patient Care Team: Adin Hector, MD as PCP - General (Internal Medicine) Telford Nab, RN as Oncology Nurse Navigator  CHIEF COMPLAINTS/PURPOSE OF CONSULTATION: lung nodule/mass  #  Oncology History Overview Note  #FEB 2021- LUNG, LEFT LOWER LOBE; ENB BRONCHOALVEOLAR LAVAGE:  - SUSPICIOUS FOR MALIGNANCY. 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[Dr.Aleskerov/Dr.Fleming]; STAGE I; [place];  Proceed with SBRT   Primary cancer of left lower lobe of lung ([place])  12/21/2019 Initial Diagnosis   Primary cancer of left lower lobe of lung ([facility])      HISTORY OF PRESENTING ILLNESS:  Donna Brennan 74 y.o.  female history of smoking is here for further evaluation and recommendations for newly diagnosed lung cancer.  Patient noted to have a left lower lobe nodule slowly growing over time.  This was further investigated with a PET scan-and subsequently navigational bronchoscopy-biopsy.  Pathology as above.  Patient has been referred by pulmonary-Dr. Donella Stade.  Patient awaiting starting of radiation next week.   Review of Systems  Constitutional: Negative for chills, diaphoresis, fever, malaise/fatigue and weight loss.  HENT: Negative for nosebleeds and sore throat.   Eyes: Negative for double vision.  Respiratory: Positive for shortness of breath. Negative for cough, hemoptysis, sputum production and wheezing.   Cardiovascular: Negative for chest pain, palpitations, orthopnea and leg swelling.  Gastrointestinal: Negative for abdominal pain, blood in stool, constipation, diarrhea, heartburn, melena, nausea and vomiting.  Genitourinary: Negative for dysuria, frequency and urgency.  Musculoskeletal: Positive for back pain and joint pain.  Skin: Negative.  Negative for itching and rash.  Neurological: Negative for dizziness, tingling, focal weakness, weakness and headaches.  Endo/Heme/Allergies: Does not bruise/bleed  easily.  Psychiatric/Behavioral: Negative for depression. The patient is not nervous/anxious and does not have insomnia.      MEDICAL HISTORY:  Past Medical History:  Diagnosis Date  . Colon polyps   . COPD (chronic obstructive pulmonary disease) ([place])   . Diabetes mellitus without complication ([place])   . GERD (gastroesophageal reflux disease)   . Headache   . Hyperlipidemia   . IBS (irritable bowel syndrome)   . Peptic ulcer   . Tobacco abuse     SURGICAL HISTORY: Past Surgical History:  Procedure Laterality Date  . ABDOMINAL HYSTERECTOMY    . carbuncle removal     urethra  . COLONOSCOPY    . COLONOSCOPY WITH PROPOFOL N/A 11/05/2017   Procedure: COLONOSCOPY WITH PROPOFOL;  Surgeon: Lollie Sails, MD;  Location: [facility] ENDOSCOPY;  Service: Endoscopy;  Laterality: N/A;  . ESOPHAGOGASTRODUODENOSCOPY    . ESOPHAGOGASTRODUODENOSCOPY (EGD) WITH PROPOFOL N/A 11/05/2017   Procedure: ESOPHAGOGASTRODUODENOSCOPY (EGD) WITH PROPOFOL;  Surgeon: Lollie Sails, MD;  Location: [facility] ENDOSCOPY;  Service: Endoscopy;  Laterality: N/A;  . VIDEO BRONCHOSCOPY WITH ENDOBRONCHIAL NAVIGATION N/A 12/02/2019   Procedure: VIDEO BRONCHOSCOPY WITH ENDOBRONCHIAL NAVIGATION;  Surgeon: Ottie Glazier, MD;  Location: [facility] ORS;  Service: Thoracic;  Laterality: N/A;  . VIDEO BRONCHOSCOPY WITH ENDOBRONCHIAL ULTRASOUND N/A 12/02/2019   Procedure: VIDEO BRONCHOSCOPY WITH ENDOBRONCHIAL ULTRASOUND;  Surgeon: Ottie Glazier, MD;  Location: [facility] ORS;  Service: Thoracic;  Laterality: N/A;    SOCIAL HISTORY: Social History   Socioeconomic History  . Marital status: Married    Spouse name: Not on file  . Number of children: Not on file  . Years of education: Not on file  . Highest education level: Not on file  Occupational History  . Not on file  Tobacco Use  .  Smoking status: Current Some Day Smoker    Packs/day: 0.50    Years: 45.00    Pack years: 22.50    Types: Cigarettes  . Smokeless tobacco: Never  Used  Substance and Sexual Activity  . Alcohol use: No  . Drug use: No  . Sexual activity: Not on file  Other Topics Concern  . 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 [place]     Social Determinants of Health   Financial Resource Strain:   . Difficulty of Paying Living Expenses:   Food Insecurity:   . Worried About Charity fundraiser in the Last Year:   . Arboriculturist in the Last Year:   Transportation Needs:   . Film/video editor (Medical):   Marland Kitchen Lack of Transportation (Non-Medical):   Physical Activity:   . Days of Exercise per Week:   . Minutes of Exercise per Session:   Stress:   . Feeling of Stress :   Social Connections:   . Frequency of Communication with Friends and Family:   . Frequency of Social Gatherings with Friends and Family:   . Attends Religious Services:   . Active Member of Clubs or Organizations:   . Attends Archivist Meetings:   Marland Kitchen Marital Status:   Intimate Partner Violence:   . Fear of Current or Ex-Partner:   . Emotionally Abused:   Marland Kitchen Physically Abused:   . Sexually Abused:     FAMILY HISTORY: No family history on file.  ALLERGIES:  is allergic to penicillins; metformin; and sulfa antibiotics.  MEDICATIONS:  Current Outpatient Medications  Medication Sig Dispense Refill  . acetaminophen (TYLENOL) 500 MG tablet Take 1,000 mg by mouth every 6 (six) hours as needed (for pain.).     Marland Kitchen albuterol (PROVENTIL HFA;VENTOLIN HFA) 108 (90 Base) MCG/ACT inhaler Inhale 1-2 puffs into the lungs every 6 (six) hours as needed for wheezing or shortness of breath.     . ezetimibe (ZETIA) 10 MG tablet Take 10 mg by mouth daily.     . Fluticasone-Salmeterol (ADVAIR) 250-50 MCG/DOSE AEPB Inhale 1 puff into the lungs at bedtime.     . furosemide (LASIX) 20 MG tablet Take 20 mg by mouth daily as needed (fluid retention.).     Marland Kitchen hyoscyamine  (LEVBID) 0.375 MG 12 hr tablet Take 0.375 mg by mouth daily as needed (abdominal cramping).     . Ipratropium-Albuterol (COMBIVENT) 20-100 MCG/ACT AERS respimat Inhale 1 puff into the lungs every 4 (four) hours as needed for wheezing.    Marland Kitchen LANTUS SOLOSTAR 100 UNIT/ML Solostar Pen Inject 24 Units into the skin at bedtime.    . meloxicam (MOBIC) 7.5 MG tablet Take 7.5 mg by mouth daily as needed for pain.    . Multiple Vitamin (MULTIVITAMIN WITH MINERALS) TABS tablet Take 1 tablet by mouth daily. One-A-Day for Women    . nicotine polacrilex (NICORETTE) 4 MG gum Take 4 mg by mouth in the morning, at noon, and at bedtime.    Marland Kitchen nystatin (MYCOSTATIN) 100000 UNIT/ML suspension Take 5 mLs by mouth 2 (two) times daily as needed (throat irritation.).    Marland Kitchen omeprazole (PRILOSEC) 20 MG capsule Take 20 mg by mouth daily before breakfast.     . rosuvastatin (CRESTOR) 40 MG tablet Take 40 mg by mouth every evening.     . vitamin B-12 (CYANOCOBALAMIN) 1000 MCG tablet Take  1,000 mcg by mouth daily.     No current facility-administered medications for this visit.      Marland Kitchen  PHYSICAL EXAMINATION: ECOG PERFORMANCE STATUS: 0 - Asymptomatic  Vitals:   12/21/19 1516  BP: 121/72  Pulse: 94  Temp: (!) 96.5 F (35.8 C)   Filed Weights   12/21/19 1516  Weight: 146 lb (66.2 kg)    Physical Exam  Constitutional: She is oriented to person, place, and time and well-developed, well-nourished, and in no distress.  HENT:  Head: Normocephalic and atraumatic.  Mouth/Throat: Oropharynx is clear and moist. No oropharyngeal exudate.  Eyes: Pupils are equal, round, and reactive to light.  Cardiovascular: Normal rate and regular rhythm.  Pulmonary/Chest: No respiratory distress. She has no wheezes.  Decreased air entry bilaterally bases.  Abdominal: Soft. Bowel sounds are normal. She exhibits no distension and no mass. There is no abdominal tenderness. There is no rebound and no guarding.  Musculoskeletal:         General: No tenderness or edema. Normal range of motion.     Cervical back: Normal range of motion and neck supple.  Neurological: She is alert and oriented to person, place, and time.  Skin: Skin is warm.  Psychiatric: Affect normal.     LABORATORY DATA:  I have reviewed the data as listed Lab Results  Component Value Date   WBC 7.3 11/28/2019   HGB 13.6 11/28/2019   HCT 40.8 11/28/2019   MCV 92.5 11/28/2019   PLT 333 11/28/2019   Recent Labs    05/30/19 1030  NA 139  K 4.2  CL 104  CO2 26  GLUCOSE 141*  BUN 17  CREATININE 1.06*  CALCIUM 8.7*  GFRNONAA 52*  GFRAA 60*  PROT 6.9  ALBUMIN 3.0*  AST 17  ALT 21  ALKPHOS 68  BILITOT 0.7    RADIOGRAPHIC STUDIES: I have personally reviewed the radiological images as listed and agreed with the findings in the report. DG C-Arm 1-60 Min-No Report  Result Date: 12/02/2019 Fluoroscopy was utilized by the requesting physician.  No radiographic interpretation.   CT Super D Chest W Contrast  Result Date: 12/02/2019 CLINICAL DATA:  LEFT lower lobe pulmonary nodule. Former smoker. EXAM: CT CHEST WITH CONTRAST TECHNIQUE: Multidetector CT imaging of the chest was performed using thin slice collimation for electromagnetic bronchoscopy planning purposes, with intravenous contrast. CONTRAST:  41m OMNIPAQUE IOHEXOL 300 MG/ML  SOLN COMPARISON:  None. FINDINGS: Cardiovascular: Coronary artery calcification and aortic atherosclerotic calcification. Mediastinum/Nodes: No axillary supraclavicular adenopathy. No mediastinal hilar adenopathy. No pericardial effusion. Lungs/Pleura: Multilobular nodule in the central LEFT lower lobe measures 15 mm x 9 mm (image 32/3). Lesion new from CT 01/21/2018 increased in size from CT 03/10/2019 where largest nodule measured 5 mm. There is no communication with the pulmonary artery appreciated. No additional pulmonary nodules present. No centrilobular emphysema. Upper Abdomen: Limited view of the liver, kidneys,  pancreas are unremarkable. Normal adrenal glands. Benign low-density cysts in the LEFT hepatic lobe noted. Musculoskeletal: No aggressive osseous lesion IMPRESSION: 1. Multilobular nodule in the LEFT lower lobe. Lesion new from 01/21/2018 and increased in size from 03/10/2019. No communication with the pulmonary artery appreciated. 2. No mediastinal lymphadenopathy. 3. Aortic Atherosclerosis (ICD10-I70.0). Electronically Signed   By: SSuzy BouchardM.D.   On: 12/02/2019 11:44    ASSESSMENT & PLAN:   Primary cancer of left lower lobe of lung ([facility]) #Clinically stage I non-small cell lung cancer-biopsy-dysplastic cells suspicious of malignancy s/but no invasion noted available  limited sample.  Discussed with patient that this is clinically suspicious for invasive cancer; and hence this should be addressed similarly.  #Discussed option of surgery/resection; patient declines.  States that given her age/she is concerned about the recovery/potential complications.   # "MGUS"-after extensive work-up patient not thought to have MGUS.  No further work-up needed.  #Patient to continue follow-up with Dr. Crystal/Dr. Raul Del.  Follow-up with me as needed.  # DISPOSITION: # follow as needed- Dr.B  Cc; [place].   All questions were answered. The patient knows to call the clinic with any problems, questions or concerns.    Cammie Sickle, MD 12/22/2019 7:01 AM

## 2019-12-23 DIAGNOSIS — Z510 Encounter for antineoplastic radiation therapy: Secondary | ICD-10-CM | POA: Diagnosis not present

## 2019-12-27 ENCOUNTER — Other Ambulatory Visit: Payer: Self-pay

## 2019-12-27 ENCOUNTER — Ambulatory Visit
Admission: RE | Admit: 2019-12-27 | Discharge: 2019-12-27 | Disposition: A | Discharge status: Home / Self Care | Payer: Medicare Other | Source: Ambulatory Visit | Attending: Radiation Oncology | Admitting: Radiation Oncology

## 2019-12-27 ENCOUNTER — Ambulatory Visit: Payer: Medicare Other

## 2019-12-27 DIAGNOSIS — Z510 Encounter for antineoplastic radiation therapy: Secondary | ICD-10-CM | POA: Diagnosis not present

## 2019-12-28 ENCOUNTER — Ambulatory Visit
Admission: RE | Admit: 2019-12-28 | Discharge: 2019-12-28 | Disposition: A | Discharge status: Home / Self Care | Payer: Medicare Other | Source: Ambulatory Visit | Attending: Radiation Oncology | Admitting: Radiation Oncology

## 2019-12-28 DIAGNOSIS — Z510 Encounter for antineoplastic radiation therapy: Secondary | ICD-10-CM | POA: Diagnosis not present

## 2019-12-29 ENCOUNTER — Ambulatory Visit
Admission: RE | Admit: 2019-12-29 | Discharge: 2019-12-29 | Disposition: A | Discharge status: Home / Self Care | Payer: Medicare Other | Source: Ambulatory Visit | Attending: Radiation Oncology | Admitting: Radiation Oncology

## 2019-12-29 ENCOUNTER — Ambulatory Visit: Payer: Medicare Other

## 2019-12-29 DIAGNOSIS — Z510 Encounter for antineoplastic radiation therapy: Secondary | ICD-10-CM | POA: Diagnosis not present

## 2019-12-30 ENCOUNTER — Ambulatory Visit
Admission: RE | Admit: 2019-12-30 | Discharge: 2019-12-30 | Disposition: A | Discharge status: Home / Self Care | Payer: Medicare Other | Source: Ambulatory Visit | Attending: Radiation Oncology | Admitting: Radiation Oncology

## 2019-12-30 DIAGNOSIS — Z510 Encounter for antineoplastic radiation therapy: Secondary | ICD-10-CM | POA: Diagnosis not present

## 2020-01-02 ENCOUNTER — Ambulatory Visit
Admission: RE | Admit: 2020-01-02 | Discharge: 2020-01-02 | Disposition: A | Discharge status: Home / Self Care | Payer: Medicare Other | Source: Ambulatory Visit | Attending: Radiation Oncology | Admitting: Radiation Oncology

## 2020-01-02 DIAGNOSIS — Z510 Encounter for antineoplastic radiation therapy: Secondary | ICD-10-CM | POA: Diagnosis not present

## 2020-01-03 ENCOUNTER — Ambulatory Visit: Payer: Medicare Other

## 2020-01-03 ENCOUNTER — Ambulatory Visit
Admission: RE | Admit: 2020-01-03 | Discharge: 2020-01-03 | Disposition: A | Discharge status: Home / Self Care | Payer: Medicare Other | Source: Ambulatory Visit | Attending: Radiation Oncology | Admitting: Radiation Oncology

## 2020-01-03 DIAGNOSIS — Z510 Encounter for antineoplastic radiation therapy: Secondary | ICD-10-CM | POA: Diagnosis not present

## 2020-01-04 ENCOUNTER — Ambulatory Visit
Admission: RE | Admit: 2020-01-04 | Discharge: 2020-01-04 | Disposition: A | Discharge status: Home / Self Care | Payer: Medicare Other | Source: Ambulatory Visit | Attending: Radiation Oncology | Admitting: Radiation Oncology

## 2020-01-04 DIAGNOSIS — Z510 Encounter for antineoplastic radiation therapy: Secondary | ICD-10-CM | POA: Diagnosis not present

## 2020-01-05 ENCOUNTER — Ambulatory Visit
Admission: RE | Admit: 2020-01-05 | Discharge: 2020-01-05 | Disposition: A | Discharge status: Home / Self Care | Payer: Medicare Other | Source: Ambulatory Visit | Attending: Radiation Oncology | Admitting: Radiation Oncology

## 2020-01-05 ENCOUNTER — Ambulatory Visit: Payer: Medicare Other

## 2020-01-05 DIAGNOSIS — Z510 Encounter for antineoplastic radiation therapy: Secondary | ICD-10-CM | POA: Insufficient documentation

## 2020-01-05 DIAGNOSIS — J984 Other disorders of lung: Secondary | ICD-10-CM | POA: Diagnosis present

## 2020-01-05 NOTE — Telephone Encounter (Signed)
x

## 2020-01-06 ENCOUNTER — Ambulatory Visit
Admission: RE | Admit: 2020-01-06 | Discharge: 2020-01-06 | Disposition: A | Discharge status: Home / Self Care | Payer: Medicare Other | Source: Ambulatory Visit | Attending: Radiation Oncology | Admitting: Radiation Oncology

## 2020-01-06 DIAGNOSIS — Z510 Encounter for antineoplastic radiation therapy: Secondary | ICD-10-CM | POA: Diagnosis not present

## 2020-01-09 ENCOUNTER — Ambulatory Visit
Admission: RE | Admit: 2020-01-09 | Discharge: 2020-01-09 | Disposition: A | Discharge status: Home / Self Care | Payer: Medicare Other | Source: Ambulatory Visit | Attending: Radiation Oncology | Admitting: Radiation Oncology

## 2020-01-09 DIAGNOSIS — Z510 Encounter for antineoplastic radiation therapy: Secondary | ICD-10-CM | POA: Diagnosis not present

## 2020-01-10 ENCOUNTER — Ambulatory Visit: Payer: Medicare Other

## 2020-02-09 ENCOUNTER — Encounter: Payer: Self-pay | Admitting: Radiation Oncology

## 2020-02-09 ENCOUNTER — Other Ambulatory Visit: Payer: Self-pay | Admitting: *Deleted

## 2020-02-09 ENCOUNTER — Ambulatory Visit
Admission: RE | Admit: 2020-02-09 | Discharge: 2020-02-09 | Disposition: A | Discharge status: Home / Self Care | Payer: Medicare Other | Source: Ambulatory Visit | Attending: Radiation Oncology | Admitting: Radiation Oncology

## 2020-02-09 ENCOUNTER — Other Ambulatory Visit: Payer: Self-pay

## 2020-02-09 VITALS — BP 133/75 | HR 80 | Temp 97.3°F | Wt 145.0 lb

## 2020-02-09 DIAGNOSIS — R05 Cough: Secondary | ICD-10-CM | POA: Insufficient documentation

## 2020-02-09 DIAGNOSIS — C3432 Malignant neoplasm of lower lobe, left bronchus or lung: Secondary | ICD-10-CM

## 2020-02-09 DIAGNOSIS — R911 Solitary pulmonary nodule: Secondary | ICD-10-CM | POA: Insufficient documentation

## 2020-02-09 DIAGNOSIS — Z923 Personal history of irradiation: Secondary | ICD-10-CM | POA: Insufficient documentation

## 2020-02-09 DIAGNOSIS — F17210 Nicotine dependence, cigarettes, uncomplicated: Secondary | ICD-10-CM | POA: Insufficient documentation

## 2020-02-09 NOTE — Progress Notes (Signed)
Radiation Oncology Follow up Note  Name: Donna Brennan   Date:   02/09/2020 MRN:  [id number] DOB: 19-May-1946    This 74 y.o. female presents to the clinic today for 1 month follow-up status post.  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  Seen today in routine follow-up she is doing well she specifically denies hemoptysis or chest tightness she has a persistent nonproductive cough.  She has not yet seen medical oncology in follow-up we will arrange that.  COMPLICATIONS OF TREATMENT: none  FOLLOW UP COMPLIANCE: keeps appointments   PHYSICAL EXAM:  BP 133/75 (BP Location: Left Arm, Patient Position: Sitting, Cuff Size: Normal)   Pulse 80   Temp (!) 97.3 F (36.3 C) (Tympanic)   Wt 145 lb (65.8 kg)   SpO2 97% Comment: room air  BMI 26.87 kg/m  Well-developed well-nourished patient in NAD. HEENT reveals PERLA, EOMI, discs not visualized.  Oral cavity is clear. No oral mucosal lesions are identified. Neck is clear without evidence of cervical or supraclavicular adenopathy. Lungs are clear to A&P. Cardiac examination is essentially unremarkable with regular rate and rhythm without murmur rub or thrill. Abdomen is benign with no organomegaly or masses noted. Motor sensory and DTR levels are equal and symmetric in the upper and lower extremities. Cranial nerves II through XII are grossly intact. Proprioception is intact. No peripheral adenopathy or edema is identified. No motor or sensory levels are noted. Crude visual fields are within normal range.  RADIOLOGY RESULTS: No current films to review  PLAN: Present time patient is doing well 1 month out from hypofractionated course of radiation therapy for  presumed stage I non-small cell lung cancer.  And pleased with her overall progress.  I have asked to see her back in 3 months for follow-up with a CT scan at that time.  We will also make a follow-up appointment with medical oncology.  Patient is to call sooner with any concerns.  I would like to take this opportunity to thank you for allowing me to participate in the care of your patient.Noreene Filbert, MD

## 2020-02-10 ENCOUNTER — Telehealth: Payer: Self-pay | Admitting: Licensed Clinical Social Worker

## 2020-02-10 ENCOUNTER — Ambulatory Visit
Admission: RE | Admit: 2020-02-10 | Discharge: 2020-02-10 | Disposition: A | Discharge status: Home / Self Care | Payer: Medicare Other | Source: Ambulatory Visit | Attending: Internal Medicine | Admitting: Internal Medicine

## 2020-02-10 ENCOUNTER — Inpatient Hospital Stay: Payer: Medicare Other | Attending: Internal Medicine | Admitting: Internal Medicine

## 2020-02-10 DIAGNOSIS — C3432 Malignant neoplasm of lower lobe, left bronchus or lung: Secondary | ICD-10-CM | POA: Insufficient documentation

## 2020-02-10 DIAGNOSIS — R911 Solitary pulmonary nodule: Secondary | ICD-10-CM | POA: Diagnosis not present

## 2020-02-10 DIAGNOSIS — R0609 Other forms of dyspnea: Secondary | ICD-10-CM | POA: Diagnosis not present

## 2020-02-10 DIAGNOSIS — F17210 Nicotine dependence, cigarettes, uncomplicated: Secondary | ICD-10-CM | POA: Diagnosis not present

## 2020-02-10 DIAGNOSIS — R05 Cough: Secondary | ICD-10-CM | POA: Diagnosis not present

## 2020-02-10 DIAGNOSIS — E119 Type 2 diabetes mellitus without complications: Secondary | ICD-10-CM | POA: Insufficient documentation

## 2020-02-10 MED ORDER — HYDROCOD POLST-CPM POLST ER 10-8 MG/5ML PO SUER: 5.0000 mL | Freq: Every evening | ORAL | 0 refills | Status: DC | PRN

## 2020-02-10 MED ORDER — ALBUTEROL SULFATE HFA 108 (90 BASE) MCG/ACT IN AERS: 1.0000 | INHALATION_SPRAY | Freq: Four times a day (QID) | RESPIRATORY_TRACT | 1 refills | Status: AC | PRN

## 2020-02-10 NOTE — Progress Notes (Signed)
[place] NOTE  Patient Care Team: Adin Hector, MD as PCP - General (Internal Medicine) Telford Nab, RN as Oncology Nurse Navigator  CHIEF COMPLAINTS/PURPOSE OF CONSULTATION: Lung cancer  #  Oncology History Overview Note  #FEB 2021- LUNG, LEFT LOWER LOBE; ENB BRONCHOALVEOLAR LAVAGE:  - SUSPICIOUS FOR MALIGNANCY. 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  female history of smoking left lower lobe lung cancer/non-small cell s/p SBRT is here for follow-up.  Patient underwent SBRT approximately a month ago.   She complains of worsening cough for the last few weeks.  Denies any chest pain.  Complains of shortness of breath on exertion.  Review of Systems  Constitutional: Negative for chills, diaphoresis, fever, malaise/fatigue and weight loss.  HENT: Negative for nosebleeds and sore throat.   Eyes: Negative for double vision.  Respiratory: Positive for shortness of breath. Negative for cough, hemoptysis, sputum production and wheezing.   Cardiovascular: Negative for chest pain, palpitations, orthopnea and leg swelling.  Gastrointestinal: Negative for abdominal pain, blood in stool, constipation, diarrhea, heartburn, melena, nausea and vomiting.  Genitourinary: Negative for dysuria, frequency and urgency.  Musculoskeletal: Positive for back pain and joint pain.  Skin: Negative.  Negative for itching and rash.  Neurological: Negative for dizziness, tingling, focal weakness, weakness and headaches.  Endo/Heme/Allergies: Does not bruise/bleed easily.  Psychiatric/Behavioral: Negative for depression. The patient is not nervous/anxious and does not have  insomnia.      MEDICAL HISTORY:  Past Medical History:  Diagnosis Date  . Colon polyps   . COPD (chronic obstructive pulmonary disease) ([place])   . Diabetes mellitus without complication ([place])   . GERD (gastroesophageal reflux disease)   . Headache   . Hyperlipidemia   . IBS (irritable bowel syndrome)   . Peptic ulcer   . Tobacco abuse     SURGICAL HISTORY: Past Surgical History:  Procedure Laterality Date  . ABDOMINAL HYSTERECTOMY    . carbuncle removal     urethra  . COLONOSCOPY    . COLONOSCOPY WITH PROPOFOL N/A 11/05/2017   Procedure: COLONOSCOPY WITH PROPOFOL;  Surgeon: Lollie Sails, MD;  Location: [facility] ENDOSCOPY;  Service: Endoscopy;  Laterality: N/A;  . ESOPHAGOGASTRODUODENOSCOPY    . ESOPHAGOGASTRODUODENOSCOPY (EGD) WITH PROPOFOL N/A 11/05/2017   Procedure: ESOPHAGOGASTRODUODENOSCOPY (EGD) WITH PROPOFOL;  Surgeon: Lollie Sails, MD;  Location[facility] ENDOSCOPY;  Service: Endoscopy;  Laterality: N/A;  . VIDEO BRONCHOSCOPY WITH ENDOBRONCHIAL NAVIGATION N/A 12/02/2019   Procedure: VIDEO BRONCHOSCOPY WITH ENDOBRONCHIAL NAVIGATION;  Surgeon: Ottie Glazier, MD;  Location: [facility] ORS;  Service: Thoracic;  Laterality: N/A;  . VIDEO BRONCHOSCOPY WITH ENDOBRONCHIAL ULTRASOUND N/A 12/02/2019   Procedure: VIDEO BRONCHOSCOPY WITH ENDOBRONCHIAL ULTRASOUND;  Surgeon: Ottie Glazier, MD;  Location: [facility] ORS;  Service: Thoracic;  Laterality: N/A;    SOCIAL HISTORY: Social History   Socioeconomic History  . Marital status: Married    Spouse name: Not on file  . Number of children: Not on file  . Years of education: Not on file  . Highest education level: Not on file  Occupational History  . Not on file  Tobacco Use  . Smoking status: Current Some Day Smoker    Packs/day: 0.50  Years: 45.00    Pack years: 22.50    Types: Cigarettes  . Smokeless tobacco: Never Used  Substance and Sexual Activity  . Alcohol use: No  . Drug use: No  . Sexual activity: Not on file   Other Topics Concern  . 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 [place]     Social Determinants of Health   Financial Resource Strain:   . Difficulty of Paying Living Expenses:   Food Insecurity:   . Worried About Charity fundraiser in the Last Year:   . Arboriculturist in the Last Year:   Transportation Needs:   . Film/video editor (Medical):   Marland Kitchen Lack of Transportation (Non-Medical):   Physical Activity:   . Days of Exercise per Week:   . Minutes of Exercise per Session:   Stress:   . Feeling of Stress :   Social Connections:   . Frequency of Communication with Friends and Family:   . Frequency of Social Gatherings with Friends and Family:   . Attends Religious Services:   . Active Member of Clubs or Organizations:   . Attends Archivist Meetings:   Marland Kitchen Marital Status:   Intimate Partner Violence:   . Fear of Current or Ex-Partner:   . Emotionally Abused:   Marland Kitchen Physically Abused:   . Sexually Abused:     FAMILY HISTORY: No family history on file.  ALLERGIES:  is allergic to penicillins; metformin; and sulfa antibiotics.  MEDICATIONS:  Current Outpatient Medications  Medication Sig Dispense Refill  . acetaminophen (TYLENOL) 500 MG tablet Take 1,000 mg by mouth every 6 (six) hours as needed (for pain.).     Marland Kitchen albuterol (VENTOLIN HFA) 108 (90 Base) MCG/ACT inhaler Inhale 1-2 puffs into the lungs every 6 (six) hours as needed for wheezing or shortness of breath. 8 g 1  . chlorpheniramine-HYDROcodone (TUSSIONEX) 10-8 MG/5ML SUER Take 5 mLs by mouth at bedtime as needed for cough. 140 mL 0  . ezetimibe (ZETIA) 10 MG tablet Take 10 mg by mouth daily.     . Fluticasone-Salmeterol (ADVAIR) 250-50 MCG/DOSE AEPB Inhale 1 puff into the lungs at bedtime.     . furosemide (LASIX) 20 MG tablet Take 20 mg by mouth daily as needed (fluid retention.).     Marland Kitchen HYDROMET  5-1.5 MG/5ML syrup Take 5 mLs by mouth every 6 (six) hours as needed.    . hyoscyamine (LEVBID) 0.375 MG 12 hr tablet Take 0.375 mg by mouth daily as needed (abdominal cramping).     . Ipratropium-Albuterol (COMBIVENT) 20-100 MCG/ACT AERS respimat Inhale 1 puff into the lungs every 4 (four) hours as needed for wheezing.    Marland Kitchen LANTUS SOLOSTAR 100 UNIT/ML Solostar Pen Inject 24 Units into the skin at bedtime.    . meloxicam (MOBIC) 7.5 MG tablet Take 7.5 mg by mouth daily as needed for pain.    . Multiple Vitamin (MULTIVITAMIN WITH MINERALS) TABS tablet Take 1 tablet by mouth daily. One-A-Day for Women    . nicotine polacrilex (NICORETTE) 4 MG gum Take 4 mg by mouth in the morning, at noon, and at bedtime.    Marland Kitchen nystatin (MYCOSTATIN) 100000 UNIT/ML suspension Take 5 mLs by mouth 2 (two) times daily as needed (throat irritation.).    Marland Kitchen omeprazole (PRILOSEC) 20 MG capsule Take 20 mg by mouth daily before breakfast.     .  rosuvastatin (CRESTOR) 40 MG tablet Take 40 mg by mouth every evening.     . vitamin B-12 (CYANOCOBALAMIN) 1000 MCG tablet Take 1,000 mcg by mouth daily.     No current facility-administered medications for this visit.      Marland Kitchen  PHYSICAL EXAMINATION: ECOG PERFORMANCE STATUS: 0 - Asymptomatic  Vitals:   02/10/20 1031  BP: 111/72  Temp: (!) 96.9 F (36.1 C)   Filed Weights   02/10/20 1031  Weight: 145 lb 8 oz (66 kg)    Physical Exam  Constitutional: She is oriented to person, place, and time and well-developed, well-nourished, and in no distress.  HENT:  Head: Normocephalic and atraumatic.  Mouth/Throat: Oropharynx is clear and moist. No oropharyngeal exudate.  Eyes: Pupils are equal, round, and reactive to light.  Cardiovascular: Normal rate and regular rhythm.  Pulmonary/Chest: No respiratory distress. She has no wheezes.  Decreased air entry bilaterally bases.  Abdominal: Soft. Bowel sounds are normal. She exhibits no distension and no mass. There is no abdominal  tenderness. There is no rebound and no guarding.  Musculoskeletal:        General: No tenderness or edema. Normal range of motion.     Cervical back: Normal range of motion and neck supple.  Neurological: She is alert and oriented to person, place, and time.  Skin: Skin is warm.  Psychiatric: Affect normal.   LABORATORY DATA:  I have reviewed the data as listed Lab Results  Component Value Date   WBC 7.3 11/28/2019   HGB 13.6 11/28/2019   HCT 40.8 11/28/2019   MCV 92.5 11/28/2019   PLT 333 11/28/2019   Recent Labs    05/30/19 1030  NA 139  K 4.2  CL 104  CO2 26  GLUCOSE 141*  BUN 17  CREATININE 1.06*  CALCIUM 8.7*  GFRNONAA 52*  GFRAA 60*  PROT 6.9  ALBUMIN 3.0*  AST 17  ALT 21  ALKPHOS 68  BILITOT 0.7    RADIOGRAPHIC STUDIES: I have personally reviewed the radiological images as listed and agreed with the findings in the report. DG Chest 2 View  Result Date: 02/10/2020 CLINICAL DATA:  Lung cancer status post radiation EXAM: CHEST - 2 VIEW COMPARISON:  12/02/2019 FINDINGS: The heart size and mediastinal contours are within normal limits. Atherosclerotic calcification of the aortic knob. Known left perihilar nodular density is not well seen radiographically. No focal airspace consolidation, pleural effusion, or pneumothorax. The visualized skeletal structures are unremarkable. IMPRESSION: 1. No active cardiopulmonary disease. 2. Known left perihilar nodular density is not well seen radiographically, possibly reflecting treatment response. Electronically Signed   By: Davina Poke D.O.   On: 02/10/2020 11:52    ASSESSMENT & PLAN:   Primary cancer of left lower lobe of lung (HCC) #Clinically stage I non-small cell lung cancer-biopsy-dysplastic cells suspicious of malignancy s/but no invasion noted available limited sample. S/p SBRT [April 2021]; await CT on July 28th ordered by Dr. Donella Stade.  Clinically stable.  No concerns for any immediate recurrence/progression.  #  Cough s/p RT-worsening.  Recommend chest x-ray today.  Get a prescription for Tussionex.  Also prescribed albuterol.  Hold off steroids given diabetes.  Discussed that if shortness of breath continues get worse she should follow-up with her pulmonologist for further recommendations.   # DISPOSITION: CT with RT; will with x-ray  # CXR today. # Follow up in 1st week of AUG 2021- Dr.B  Cc; [place].   All questions were answered. The patient knows to  call the clinic with any problems, questions or concerns.    Cammie Sickle, MD 02/23/2020 4:35 PM

## 2020-02-10 NOTE — Assessment & Plan Note (Addendum)
#  Clinically stage I non-small cell lung cancer-biopsy-dysplastic cells suspicious of malignancy s/but no invasion noted available limited sample. S/p SBRT [April 2021]; await CT on July 28th ordered by Dr. Donella Stade.  Clinically stable.  No concerns for any immediate recurrence/progression.  # Cough s/p RT-worsening.  Recommend chest x-ray today.  Get a prescription for Tussionex.  Also prescribed albuterol.  Hold off steroids given diabetes.  Discussed that if shortness of breath continues get worse she should follow-up with her pulmonologist for further recommendations.   # DISPOSITION: CT with RT; will with x-ray  # CXR today. # Follow up in 1st week of AUG 2021- Dr.B  Cc; [place].

## 2020-02-10 NOTE — Telephone Encounter (Signed)
Please inform patient that chest x-ray shows no major concerns. Recommend to use Tussionex/albuterol as prescribed. If not improved would recommend follow-up with her lung doctor for further recommendations. Thanks,    I called patient and let her know that her chest xray did not show any concerns and to keep using her cough meds and inhaler as prescribed. Patient verbalized understanding.

## 2020-03-08 ENCOUNTER — Other Ambulatory Visit: Payer: Self-pay | Admitting: Internal Medicine

## 2020-03-08 DIAGNOSIS — Z1231 Encounter for screening mammogram for malignant neoplasm of breast: Secondary | ICD-10-CM

## 2020-04-16 ENCOUNTER — Ambulatory Visit
Admission: RE | Admit: 2020-04-16 | Discharge: 2020-04-16 | Disposition: A | Discharge status: Home / Self Care | Payer: Medicare Other | Source: Ambulatory Visit | Attending: Internal Medicine | Admitting: Internal Medicine

## 2020-04-16 DIAGNOSIS — Z1231 Encounter for screening mammogram for malignant neoplasm of breast: Secondary | ICD-10-CM | POA: Insufficient documentation

## 2020-04-19 ENCOUNTER — Encounter: Payer: Self-pay | Admitting: *Deleted

## 2020-05-02 ENCOUNTER — Other Ambulatory Visit: Payer: Self-pay

## 2020-05-02 ENCOUNTER — Ambulatory Visit
Admission: RE | Admit: 2020-05-02 | Discharge: 2020-05-02 | Disposition: A | Discharge status: Home / Self Care | Payer: Medicare Other | Source: Ambulatory Visit | Attending: Radiation Oncology | Admitting: Radiation Oncology

## 2020-05-02 DIAGNOSIS — R911 Solitary pulmonary nodule: Secondary | ICD-10-CM | POA: Diagnosis present

## 2020-05-02 LAB — POCT I-STAT CREATININE: Creatinine, Ser: 1.1 mg/dL — ABNORMAL HIGH (ref 0.44–1.00)

## 2020-05-02 MED ORDER — IOHEXOL 300 MG/ML  SOLN
75.0000 mL | Freq: Once | INTRAMUSCULAR | Status: AC | PRN
  Administered 2020-05-02: 75 mL via INTRAVENOUS

## 2020-05-02 MED ORDER — IOHEXOL 350 MG/ML SOLN: 75.0000 mL | Freq: Once | INTRAVENOUS | Status: DC | PRN

## 2020-05-11 ENCOUNTER — Other Ambulatory Visit: Payer: Self-pay

## 2020-05-11 ENCOUNTER — Inpatient Hospital Stay: Payer: Medicare Other | Attending: Internal Medicine | Admitting: Internal Medicine

## 2020-05-11 ENCOUNTER — Ambulatory Visit: Payer: Medicare Other | Admitting: Radiation Oncology

## 2020-05-11 DIAGNOSIS — R05 Cough: Secondary | ICD-10-CM | POA: Diagnosis not present

## 2020-05-11 DIAGNOSIS — C3432 Malignant neoplasm of lower lobe, left bronchus or lung: Secondary | ICD-10-CM

## 2020-05-11 DIAGNOSIS — F17210 Nicotine dependence, cigarettes, uncomplicated: Secondary | ICD-10-CM | POA: Insufficient documentation

## 2020-05-11 DIAGNOSIS — R0602 Shortness of breath: Secondary | ICD-10-CM | POA: Diagnosis not present

## 2020-05-11 NOTE — Assessment & Plan Note (Addendum)
#  Clinically stage I non-small cell lung cancer-biopsy-dysplastic cells suspicious of malignancy s/but no invasion noted available limited sample. S/p SBRT [April 2021]; STABLE.   # July 28th, 2021- Partial reponse noted; however radiation changes noted [see below]  # Cough/wheezing/shortness of breath on exertion? Radiation pneumonitis/atelectasis-discussed re: using inhalers more frequently; continue prednisone as ordered by Dr. Raul Del.  Patient awaiting appointment with Dr. Raul Del next week.   # DISPOSITION:  # Follow up in 6 months; MD; labs- cbc/cmp;CT chest prior- Dr.B  Cc; Dr.Fleming/Dr.Crystal/Dr.KLein  # I reviewed the blood work- with the patient in detail; also reviewed the imaging independently [as summarized above]; and with the patient in detail.

## 2020-05-11 NOTE — Patient Instructions (Signed)
#   Use advair/ purple disk twice a day-   # Combivent at least 2-3 times a day  # Albuterol 3-4 times a day

## 2020-05-11 NOTE — Progress Notes (Signed)
[place] NOTE  Patient Care Team: Adin Hector, MD as PCP - General (Internal Medicine) Telford Nab, RN as Oncology Nurse Navigator  CHIEF COMPLAINTS/PURPOSE OF CONSULTATION: Lung cancer  #  Oncology History Overview Note  #FEB 2021- LUNG, LEFT LOWER LOBE; ENB BRONCHOALVEOLAR LAVAGE:  - SUSPICIOUS FOR MALIGNANCY. 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  female history of smoking left lower lobe lung cancer/non-small cell s/p SBRT is here for follow-up; review results of the CT scan.  About 2 months ago patient was evaluated by Dr. Lucilla Lame inhalers and off the prednisone.  She is currently on prednisone 5 mg a day.  Patient continues to have worsening cough and shortness of breath.  No chest pain.  No fevers.   No headaches.  No nausea or vomiting.   Review of Systems  Constitutional: Positive for malaise/fatigue. Negative for chills, diaphoresis, fever and weight loss.  HENT: Negative for nosebleeds and sore throat.   Eyes: Negative for double vision.  Respiratory: Positive for cough and shortness of breath. Negative for hemoptysis, sputum production and wheezing.   Cardiovascular: Negative for chest pain, palpitations, orthopnea and leg swelling.  Gastrointestinal: Negative for abdominal pain, blood in stool, constipation, diarrhea, heartburn, melena, nausea and vomiting.  Genitourinary: Negative for dysuria, frequency and urgency.  Musculoskeletal: Positive for back pain and joint pain.  Skin: Negative.  Negative for itching and rash.  Neurological: Negative for dizziness, tingling, focal weakness, weakness and headaches.   Endo/Heme/Allergies: Does not bruise/bleed easily.  Psychiatric/Behavioral: Negative for depression. The patient is not nervous/anxious and does not have insomnia.      MEDICAL HISTORY:  Past Medical History:  Diagnosis Date  . Colon polyps   . COPD (chronic obstructive pulmonary disease) ([place])   . Diabetes mellitus without complication ([place])   . GERD (gastroesophageal reflux disease)   . Headache   . Hyperlipidemia   . IBS (irritable bowel syndrome)   . Peptic ulcer   . Tobacco abuse     SURGICAL HISTORY: Past Surgical History:  Procedure Laterality Date  . ABDOMINAL HYSTERECTOMY    . carbuncle removal     urethra  . COLONOSCOPY    . COLONOSCOPY WITH PROPOFOL N/A 11/05/2017   Procedure: COLONOSCOPY WITH PROPOFOL;  Surgeon: Lollie Sails, MD;  Location: [facility] ENDOSCOPY;  Service: Endoscopy;  Laterality: N/A;  . ESOPHAGOGASTRODUODENOSCOPY    . ESOPHAGOGASTRODUODENOSCOPY (EGD) WITH PROPOFOL N/A 11/05/2017   Procedure: ESOPHAGOGASTRODUODENOSCOPY (EGD) WITH PROPOFOL;  Surgeon: Lollie Sails, MD;  Location[facility] ENDOSCOPY;  Service: Endoscopy;  Laterality: N/A;  . VIDEO BRONCHOSCOPY WITH ENDOBRONCHIAL NAVIGATION N/A 12/02/2019   Procedure: VIDEO BRONCHOSCOPY WITH ENDOBRONCHIAL NAVIGATION;  Surgeon: Ottie Glazier, MD;  Location: [facility] ORS;  Service: Thoracic;  Laterality: N/A;  . VIDEO BRONCHOSCOPY WITH ENDOBRONCHIAL ULTRASOUND N/A 12/02/2019   Procedure: VIDEO BRONCHOSCOPY WITH ENDOBRONCHIAL ULTRASOUND;  Surgeon: Ottie Glazier, MD;  Location: [facility] ORS;  Service: Thoracic;  Laterality: N/A;    SOCIAL HISTORY: Social History   Socioeconomic History  . Marital status: Married    Spouse name: Not on file  . Number of children: Not on file  . Years of education: Not on file  . Highest  education level: Not on file  Occupational History  . Not on file  Tobacco Use  . Smoking status: Current Some Day Smoker    Packs/day: 0.50    Years: 45.00    Pack years: 22.50     Types: Cigarettes  . Smokeless tobacco: Never Used  Vaping Use  . Vaping Use: Never used  Substance and Sexual Activity  . Alcohol use: No  . Drug use: No  . Sexual activity: Not on file  Other Topics Concern  . 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 [place]     Social Determinants of Health   Financial Resource Strain:   . Difficulty of Paying Living Expenses:   Food Insecurity:   . Worried About Charity fundraiser in the Last Year:   . Arboriculturist in the Last Year:   Transportation Needs:   . Film/video editor (Medical):   Marland Kitchen Lack of Transportation (Non-Medical):   Physical Activity:   . Days of Exercise per Week:   . Minutes of Exercise per Session:   Stress:   . Feeling of Stress :   Social Connections:   . Frequency of Communication with Friends and Family:   . Frequency of Social Gatherings with Friends and Family:   . Attends Religious Services:   . Active Member of Clubs or Organizations:   . Attends Archivist Meetings:   Marland Kitchen Marital Status:   Intimate Partner Violence:   . Fear of Current or Ex-Partner:   . Emotionally Abused:   Marland Kitchen Physically Abused:   . Sexually Abused:     FAMILY HISTORY: Family History  Problem Relation Age of Onset  . Breast cancer Neg Hx     ALLERGIES:  is allergic to penicillins, metformin, and sulfa antibiotics.  MEDICATIONS:  Current Outpatient Medications  Medication Sig Dispense Refill  . acetaminophen (TYLENOL) 500 MG tablet Take 1,000 mg by mouth every 6 (six) hours as needed (for pain.).     Marland Kitchen albuterol (VENTOLIN HFA) 108 (90 Base) MCG/ACT inhaler Inhale 1-2 puffs into the lungs every 6 (six) hours as needed for wheezing or shortness of breath. 8 g 1  . chlorpheniramine-HYDROcodone (TUSSIONEX) 10-8 MG/5ML SUER Take 5 mLs by mouth at bedtime as needed for cough. 140 mL 0  . ezetimibe (ZETIA) 10 MG  tablet Take 10 mg by mouth daily.     . Fluticasone-Salmeterol (ADVAIR) 250-50 MCG/DOSE AEPB Inhale 1 puff into the lungs at bedtime.     . furosemide (LASIX) 20 MG tablet Take 20 mg by mouth daily as needed (fluid retention.).     Marland Kitchen HYDROMET 5-1.5 MG/5ML syrup Take 5 mLs by mouth every 6 (six) hours as needed.    . hyoscyamine (LEVBID) 0.375 MG 12 hr tablet Take 0.375 mg by mouth daily as needed (abdominal cramping).     . Ipratropium-Albuterol (COMBIVENT) 20-100 MCG/ACT AERS respimat Inhale 1 puff into the lungs every 4 (four) hours as needed for wheezing.    Marland Kitchen LANTUS SOLOSTAR 100 UNIT/ML Solostar Pen Inject 24 Units into the skin at bedtime.    Marland Kitchen nystatin (MYCOSTATIN) 100000 UNIT/ML suspension Take 5 mLs by mouth 2 (two) times daily as needed (throat irritation.).    Marland Kitchen omeprazole (PRILOSEC) 20 MG capsule Take 20 mg by mouth daily before breakfast.     . rosuvastatin (CRESTOR) 40 MG tablet Take 40 mg  by mouth every evening.     . vitamin B-12 (CYANOCOBALAMIN) 1000 MCG tablet Take 1,000 mcg by mouth daily.    . meloxicam (MOBIC) 7.5 MG tablet Take 7.5 mg by mouth daily as needed for pain. (Patient not taking: Reported on 05/10/2020)    . Multiple Vitamin (MULTIVITAMIN WITH MINERALS) TABS tablet Take 1 tablet by mouth daily. One-A-Day for Women    . nicotine polacrilex (NICORETTE) 4 MG gum Take 4 mg by mouth in the morning, at noon, and at bedtime. (Patient not taking: Reported on 05/10/2020)     No current facility-administered medications for this visit.      Marland Kitchen  PHYSICAL EXAMINATION: ECOG PERFORMANCE STATUS: 0 - Asymptomatic  Vitals:   05/10/20 1421  BP: (!) 153/72  Pulse: 74  Temp: (!) 97.1 F (36.2 C)  SpO2: 97%   Filed Weights   05/10/20 1421  Weight: 145 lb 9.6 oz (66 kg)    Physical Exam HENT:     Head: Normocephalic and atraumatic.     Mouth/Throat:     Pharynx: No oropharyngeal exudate.  Eyes:     Pupils: Pupils are equal, round, and reactive to light.   Cardiovascular:     Rate and Rhythm: Normal rate and regular rhythm.  Pulmonary:     Effort: No respiratory distress.     Breath sounds: No wheezing.  Abdominal:     General: Bowel sounds are normal. There is no distension.     Palpations: Abdomen is soft. There is no mass.     Tenderness: There is no abdominal tenderness. There is no guarding or rebound.  Musculoskeletal:        General: No tenderness. Normal range of motion.     Cervical back: Normal range of motion and neck supple.  Skin:    General: Skin is warm.  Neurological:     Mental Status: She is alert and oriented to person, place, and time.  Psychiatric:        Mood and Affect: Affect normal.    LABORATORY DATA:  I have reviewed the data as listed Lab Results  Component Value Date   WBC 7.3 11/28/2019   HGB 13.6 11/28/2019   HCT 40.8 11/28/2019   MCV 92.5 11/28/2019   PLT 333 11/28/2019   Recent Labs    05/30/19 1030 05/02/20 1303  NA 139  --   K 4.2  --   CL 104  --   CO2 26  --   GLUCOSE 141*  --   BUN 17  --   CREATININE 1.06* 1.10*  CALCIUM 8.7*  --   GFRNONAA 52*  --   GFRAA 60*  --   PROT 6.9  --   ALBUMIN 3.0*  --   AST 17  --   ALT 21  --   ALKPHOS 68  --   BILITOT 0.7  --     RADIOGRAPHIC STUDIES: I have personally reviewed the radiological images as listed and agreed with the findings in the report. CT CHEST W CONTRAST  Result Date: 05/03/2020 CLINICAL DATA:  Follow-up non-small cell lung carcinoma. Status post chemotherapy and radiation therapy. EXAM: CT CHEST WITH CONTRAST TECHNIQUE: Multidetector CT imaging of the chest was performed during intravenous contrast administration. CONTRAST:  33m OMNIPAQUE IOHEXOL 300 MG/ML  SOLN COMPARISON:  12/02/2019 FINDINGS: Cardiovascular: No acute findings. Aortic and coronary artery atherosclerosis noted. Mediastinum/Nodes: No masses or pathologically enlarged lymph nodes identified. Lungs/Pleura: Pulmonary nodule in the central left lower lobe  currently measures 11 x 8 mm on image 76/3, compared to 15 x 9 mm previously. 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 No evidence of pleural effusion. Upper Abdomen: Adrenal glands are normal in appearance. Diffuse hepatic steatosis and several left hepatic lobe cysts are again seen. Musculoskeletal:  No suspicious bone lesions. IMPRESSION: Mild decrease in size of central left lower lobe pulmonary nodule. 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 No evidence of lymphadenopathy or pleural effusion. Hepatic steatosis. Aortic Atherosclerosis (ICD10-I70.0). Electronically Signed   By: Marlaine Hind M.D.   On: 05/03/2020 08:30   MM 3D SCREEN BREAST BILATERAL  Result Date: 04/17/2020 CLINICAL DATA:  Screening. EXAM: DIGITAL SCREENING BILATERAL MAMMOGRAM WITH TOMO AND CAD COMPARISON:  Previous exam(s). ACR Breast Density Category c: The breast tissue is heterogeneously dense, which may obscure small masses. FINDINGS: There are no findings suspicious for malignancy. Images were processed with CAD. IMPRESSION: No mammographic evidence of malignancy. A result letter of this screening mammogram will be mailed directly to the patient. RECOMMENDATION: Screening mammogram in one year. (Code:SM-B-01Y) BI-RADS CATEGORY  1: Negative. Electronically Signed   By: Margarette Canada M.D.   On: 04/17/2020 16:15    ASSESSMENT & PLAN:   Primary cancer of left lower lobe of lung ([facility]) #Clinically stage I non-small cell lung cancer-biopsy-dysplastic cells suspicious of malignancy s/but no invasion noted available limited sample. S/p SBRT [April 2021]; STABLE.   # July 28th, 2021- Partial reponse noted; however radiation changes noted [see below]  # Cough/wheezing/shortness of breath on exertion? Radiation pneumonitis/atelectasis-discussed re: using inhalers more frequently; continue prednisone as ordered by  Dr. Raul Del.  Patient awaiting appointment with Dr. Raul Del next week.   # DISPOSITION:  # Follow up in 6 months; MD; labs- cbc/cmp;CT chest prior- Dr.B  Cc; Dr.Fleming/Dr.Crystal/Dr.KLein  # I reviewed the blood work- with the patient in detail; also reviewed the imaging independently [as summarized above]; and with the patient in detail.     All questions were answered. The patient knows to call the clinic with any problems, questions or concerns.    Cammie Sickle, MD 05/11/2020 12:31 PM

## 2020-05-30 ENCOUNTER — Telehealth: Payer: Self-pay

## 2020-05-30 NOTE — Telephone Encounter (Signed)
T/C to patient to discuss SCP visit.   No answer but left message asking patient to return my call.   Awaiting call.

## 2020-05-31 DIAGNOSIS — C3432 Malignant neoplasm of lower lobe, left bronchus or lung: Secondary | ICD-10-CM

## 2020-05-31 NOTE — Telephone Encounter (Signed)
Survivorship Care Plan visit completed.  Treatment summary reviewed and mailed to patient.  ASCO answers booklet reviewed and mailed to patient.  CARE program and Cancer Transitions discussed with patient along with other resources cancer center offers to patients and caregivers.  Patient verbalized understanding.  SCP packet mailed.  Patient in agreement for APP to have a visit to introduce them to the Survivorship Clinic.  Encouraged patient to call for any questions or concerns.

## 2020-06-06 ENCOUNTER — Encounter: Payer: Self-pay | Admitting: Radiation Oncology

## 2020-06-07 ENCOUNTER — Other Ambulatory Visit: Payer: Self-pay

## 2020-06-07 ENCOUNTER — Inpatient Hospital Stay: Payer: Medicare Other | Attending: Oncology | Admitting: Oncology

## 2020-06-07 ENCOUNTER — Ambulatory Visit
Admission: RE | Admit: 2020-06-07 | Discharge: 2020-06-07 | Disposition: A | Discharge status: Home / Self Care | Payer: Medicare Other | Source: Ambulatory Visit | Attending: Radiation Oncology | Admitting: Radiation Oncology

## 2020-06-07 ENCOUNTER — Encounter: Payer: Self-pay | Admitting: Oncology

## 2020-06-07 DIAGNOSIS — Z923 Personal history of irradiation: Secondary | ICD-10-CM | POA: Diagnosis not present

## 2020-06-07 DIAGNOSIS — R062 Wheezing: Secondary | ICD-10-CM | POA: Insufficient documentation

## 2020-06-07 DIAGNOSIS — R0602 Shortness of breath: Secondary | ICD-10-CM | POA: Insufficient documentation

## 2020-06-07 DIAGNOSIS — R911 Solitary pulmonary nodule: Secondary | ICD-10-CM

## 2020-06-07 DIAGNOSIS — Z87891 Personal history of nicotine dependence: Secondary | ICD-10-CM | POA: Insufficient documentation

## 2020-06-07 DIAGNOSIS — C3432 Malignant neoplasm of lower lobe, left bronchus or lung: Secondary | ICD-10-CM

## 2020-06-07 MED ORDER — HYDROCOD POLST-CPM POLST ER 10-8 MG/5ML PO SUER
5.0000 mL | Freq: Every evening | ORAL | 0 refills | Status: DC | PRN
Start: 2020-06-07 — End: 2020-12-28

## 2020-06-07 MED ORDER — PREDNISONE 10 MG (21) PO TBPK: ORAL_TABLET | ORAL | 0 refills | Status: DC

## 2020-06-07 NOTE — Progress Notes (Signed)
[place] [facility] [place]  [phone] [phone]  Patient Care Team: Adin Hector, MD as PCP - General (Internal Medicine) Telford Nab, RN as Oncology Nurse Navigator Erby Pian, MD as Referring Physician (Specialist) Cammie Sickle, MD as Consulting Physician (Internal Medicine) Noreene Filbert, MD as Referring Physician (Radiation Oncology)   Name of the patient: Donna Brennan  [id number]  26-Feb-1946   Date of visit: 06/07/20  CLINIC:  Survivorship  REASON FOR VISIT:  Survivorship Care Plan visit & to address acute survivorship needs   BRIEF ONCOLOGY HISTORY: Oncology History Overview Note  #FEB 2021- LUNG, LEFT LOWER LOBE; ENB BRONCHOALVEOLAR LAVAGE:  - SUSPICIOUS FOR MALIGNANCY. 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  Overall, she reports feeling well since completing treatment.  Has chronic persistent cough since her treatment.  States cough is pretty much all day and a bit worse at night.  She is using Tussionex to help her sleep.  Was just seen by Dr. Debroah Loop who reassured her that the cough will slowly dissipate over the coming months.  Past medical, surgical, social history were reviewed and updated as below.  We also updated patient's current medications and allergies.  ADDITIONAL REVIEW OF SYSTEMS:  Review of Systems    Constitutional: Negative.  Negative for chills, fever, malaise/fatigue and weight loss.  HENT: Negative for congestion, ear pain and tinnitus.   Eyes: Negative.  Negative for blurred vision and double vision.  Respiratory: Positive for cough and shortness of breath. Negative for sputum production.   Cardiovascular: Negative.  Negative for chest pain, palpitations and leg swelling.  Gastrointestinal: Negative.  Negative for abdominal pain, constipation, diarrhea, nausea and vomiting.  Genitourinary: Negative for dysuria, frequency and urgency.  Musculoskeletal: Negative for back pain and falls.  Skin: Negative.  Negative for rash.  Neurological: Negative.  Negative for weakness and headaches.  Endo/Heme/Allergies: Negative.  Does not bruise/bleed easily.  Psychiatric/Behavioral: Negative.  Negative for depression. The patient is not nervous/anxious and does not have insomnia.     PAST MEDICAL & SURGICAL HISTORY:  Past Medical History:  Diagnosis Date   Colon polyps    COPD (chronic obstructive pulmonary disease) ([place])    Diabetes mellitus without complication (HCC)    GERD (gastroesophageal reflux disease)    Headache    Hyperlipidemia    IBS (irritable bowel syndrome)    Peptic ulcer    Tobacco abuse    Past Surgical History:  Procedure Laterality Date   ABDOMINAL HYSTERECTOMY     carbuncle removal     urethra   COLONOSCOPY     COLONOSCOPY WITH PROPOFOL N/A 11/05/2017   Procedure: COLONOSCOPY WITH PROPOFOL;  Surgeon: Lollie Sails, MD;  Location: [facility] ENDOSCOPY;  Service: Endoscopy;  Laterality: N/A;   ESOPHAGOGASTRODUODENOSCOPY     ESOPHAGOGASTRODUODENOSCOPY (EGD) WITH PROPOFOL N/A 11/05/2017   Procedure: ESOPHAGOGASTRODUODENOSCOPY (EGD) WITH PROPOFOL;  Surgeon: Lollie Sails, MD;  Location: [facility] ENDOSCOPY;  Service: Endoscopy;  Laterality: N/A;  VIDEO BRONCHOSCOPY WITH ENDOBRONCHIAL NAVIGATION N/A 12/02/2019   Procedure: VIDEO BRONCHOSCOPY WITH  ENDOBRONCHIAL NAVIGATION;  Surgeon: Ottie Glazier, MD;  Location: [facility] ORS;  Service: Thoracic;  Laterality: N/A;   VIDEO BRONCHOSCOPY WITH ENDOBRONCHIAL ULTRASOUND N/A 12/02/2019   Procedure: VIDEO BRONCHOSCOPY WITH ENDOBRONCHIAL ULTRASOUND;  Surgeon: Ottie Glazier, MD;  Location: [facility] ORS;  Service: Thoracic;  Laterality: N/A;    SOCIAL HISTORY:    Tobacco Use: High Risk   Smoking Tobacco Use: Current Some Day Smoker   Smokeless Tobacco Use: Never Used     CURRENT MEDICATIONS:  Current Outpatient Medications on File Prior to Visit  Medication Sig Dispense Refill   acetaminophen (TYLENOL) 500 MG tablet Take 1,000 mg by mouth every 6 (six) hours as needed (for pain.).      albuterol (VENTOLIN HFA) 108 (90 Base) MCG/ACT inhaler Inhale 1-2 puffs into the lungs every 6 (six) hours as needed for wheezing or shortness of breath. 8 g 1   chlorpheniramine-HYDROcodone (TUSSIONEX) 10-8 MG/5ML SUER Take 5 mLs by mouth at bedtime as needed for cough. 140 mL 0   ezetimibe (ZETIA) 10 MG tablet Take 10 mg by mouth daily.      furosemide (LASIX) 20 MG tablet Take 20 mg by mouth daily as needed (fluid retention.).      HYDROMET 5-1.5 MG/5ML syrup Take 5 mLs by mouth every 6 (six) hours as needed.     hyoscyamine (LEVBID) 0.375 MG 12 hr tablet Take 0.375 mg by mouth daily as needed (abdominal cramping).      Ipratropium-Albuterol (COMBIVENT) 20-100 MCG/ACT AERS respimat Inhale 1 puff into the lungs every 4 (four) hours as needed for wheezing.     LANTUS SOLOSTAR 100 UNIT/ML Solostar Pen Inject 24 Units into the skin at bedtime.     meloxicam (MOBIC) 7.5 MG tablet Take 7.5 mg by mouth daily as needed for pain. (Patient not taking: Reported on 05/10/2020)     Multiple Vitamin (MULTIVITAMIN WITH MINERALS) TABS tablet Take 1 tablet by mouth daily. One-A-Day for Women     nicotine polacrilex (NICORETTE) 4 MG gum Take 4 mg by mouth in the morning, at noon, and at bedtime. (Patient not taking:  Reported on 05/10/2020)     nystatin (MYCOSTATIN) 100000 UNIT/ML suspension Take 5 mLs by mouth 2 (two) times daily as needed (throat irritation.). (Patient not taking: Reported on 06/06/2020)     omeprazole (PRILOSEC) 20 MG capsule Take 20 mg by mouth daily before breakfast.      rosuvastatin (CRESTOR) 40 MG tablet Take 40 mg by mouth every evening.      vitamin B-12 (CYANOCOBALAMIN) 1000 MCG tablet Take 1,000 mcg by mouth daily.     No current facility-administered medications on file prior to visit.    ALLERGIES: Allergies  Allergen Reactions   Penicillins Anaphylaxis and Rash    Did it involve swelling of the face/tongue/throat, SOB, or low BP? Yes Did it involve sudden or severe rash/hives, skin peeling, or any reaction on the inside of your mouth or nose? Yes Did you need to seek medical attention at a hospital or doctor's office? Yes When did it last happen?More than 40 years ago If all above answers are NO, may proceed with cephalosporin use.    Metformin Other (See Comments)    Upset stomach   Sulfa Antibiotics Nausea And Vomiting     PHYSICAL EXAM:  There were no vitals filed for this visit. There were no vitals filed for this visit.  Physical Exam Constitutional:  Appearance: Normal appearance.  HENT:     Head: Normocephalic and atraumatic.  Eyes:     Pupils: Pupils are equal, round, and reactive to light.  Cardiovascular:     Rate and Rhythm: Normal rate and regular rhythm.     Heart sounds: Normal heart sounds. No murmur heard.   Pulmonary:     Effort: Pulmonary effort is normal.     Breath sounds: Wheezing present.  Abdominal:     General: Bowel sounds are normal. There is no distension.     Palpations: Abdomen is soft.     Tenderness: There is no abdominal tenderness.  Musculoskeletal:        General: Normal range of motion.     Cervical back: Normal range of motion.  Skin:    General: Skin is warm and dry.     Findings: No rash.    Neurological:     Mental Status: She is alert and oriented to person, place, and time.  Psychiatric:        Judgment: Judgment normal.      LABORATORY DATA:  CBC    Component Value Date/Time   WBC 7.3 11/28/2019 1033   RBC 4.41 11/28/2019 1033   HGB 13.6 11/28/2019 1033   HCT 40.8 11/28/2019 1033   PLT 333 11/28/2019 1033   MCV 92.5 11/28/2019 1033   MCH 30.8 11/28/2019 1033   MCHC 33.3 11/28/2019 1033   RDW 12.3 11/28/2019 1033   LYMPHSABS 2.0 05/30/2019 1030   MONOABS 0.6 05/30/2019 1030   EOSABS 0.1 05/30/2019 1030   BASOSABS 0.1 05/30/2019 1030     Chemistry      Component Value Date/Time   NA 139 05/30/2019 1030   K 4.2 05/30/2019 1030   CL 104 05/30/2019 1030   CO2 26 05/30/2019 1030   BUN 17 05/30/2019 1030   CREATININE 1.10 (H) 05/02/2020 1303      Component Value Date/Time   CALCIUM 8.7 (L) 05/30/2019 1030   ALKPHOS 68 05/30/2019 1030   AST 17 05/30/2019 1030   ALT 21 05/30/2019 1030   BILITOT 0.7 05/30/2019 1030      DIAGNOSTIC IMAGING:  Ct chest 05/02/2020  IMPRESSION: Mild decrease in size of central left lower lobe pulmonary nodule.  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  No evidence of lymphadenopathy or pleural effusion.  Hepatic steatosis.  Aortic Atherosclerosis (ICD10-I70.0).  ASSESSMENT & PLAN:  Ms. Colvard is a pleasant 74 y.o. female with past medical history significant for stage I lung cancer treated with radiation which she completed in April 2021.  Patient presents to survivorship clinic today for survivorship care plan visit and to address any acute survivorship concerns since completing treatment.   1. lung cancer: Today, she received a copy of his [facility]) document, which was reviewed with him in detail.  The SCP details his cancer treatment history and potential late/long-term side effects of those treatments.  We discussed the follow-up schedule  he can anticipate with interval imaging for surveillance of his cancer.  I have also shared a copy of his treatment summary/SCP with his PCP.  We discussed the NCCN guidelines for surveillance after completion of definitive therapy.  We discussed that these guidelines are followed once there is no evidence of clinical or radiographic disease.  Stage I-II (primary treatment included RT) for stage III or stage IV (oligometastatic with all sites treated with definitive intent)-history and physical and chest CT +/-  contrast every 3-6 months for 3 years then history and physical and chest CT +/-contrast every 6 months for 2 years then history and physical and low-dose noncontrast enhanced chest CT annually thereafter.  I advised that patients with residual or new radiographic abnormalities may require more frequent imaging We discussed that if findings suggestive of recurrence PET/CT and/or brain MRI with contrast may be indicated.  I discussed that PET/CT is generally not warranted for routine surveillance however, many benign conditions such as atelectasis, consolidation, and radiation fibrosis are difficult to differentiate from neoplasm on standard CT imaging and therefore PET CT may be needed to differentiate true malignancy in the settings.  I did advise that areas previously treated with radiation therapy can remain FDG avid for up to 2 years and if findings concerning for recurrent or progressive disease histopathologic confirmation is recommended.  #. 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  Can trial a short course of steroids to see if this is helpful.  Rx prednisone taper called into her pharmacy. Refill Tussionex.  #. Smoking cessation: I commended continued efforts to remain tobacco-free.  We discussed that one of the most important risk reduction strategies in preventing cancer recurrence in lung cancer patients is smoking cessation.   Patient is committed to abstaining from tobacco.    #. Physical activity/Healthy eating: Getting adequate physical activity and maintaining a healthy diet as a cancer survivor is important for overall wellness and reduces the risk of cancer recurrence. We discussed the CARE program, which is a fitness program that is offered to cancer survivors free of charge.  We also reviewed "The Nutrition Rainbow" handout, as well as the American Cancer Society's booklet with recommendations for nutrition and physical activity.    #. Health promotion/Cancer screening:  Ms. Kisamore is reportedly up-to-date on her cancer screening tests and vaccinations. I encouraged her to talk with PCP about arranging appropriate cancer screening tests, as appropriate.   #. Support services/Counseling: It is not uncommon for this period of the patient's cancer care trajectory to be one of many emotions and stressors.  Ms. Authier was encouraged to take advantage of our many support services programs, support groups, and/or counseling in coping with her new life as a cancer survivor after completing anti-cancer treatment. We specifically discussed the Cancer Transitions program. He was given a calendar of the cancer center's support services events, as well as brochures for our spiritual care and free counseling resources.    Disposition/Return to clinic  -Return to survivorship clinic as needed; no additional follow-up needed at this time.  -Consider transitioning the patient to long-term survivorship, when clinically appropriate.  -RTC in Feb 2022 for repeat ct chest and f/u with Dr. Baruch Gouty.    I discussed the assessment and treatment plan with the patient. The patient was provided an opportunity to ask questions and all were answered. The patient agreed with the plan and demonstrated an understanding of the instructions.   The patient was advised to call back or seek an in-person evaluation if the symptoms worsen or if the  condition fails to improve as anticipated.   Greater than 50% was spent in counseling and coordination of care with this patient including but not limited to discussion of the relevant topics above (See A&P) including, but not limited to diagnosis and management of acute and chronic medical conditions.   Rulon Abide,  AGNP-C [facility] (clinic)  CC: Dr. Donella Stade

## 2020-06-07 NOTE — Progress Notes (Signed)
Radiation Oncology Follow up Note  Name: Donna Brennan   Date:   06/07/2020 MRN:  [id number] DOB: Apr 22, 1946    This 74 y.o. female presents to the clinic today for 13-month follow-up status post hypofractionated radiation therapy to her chest for presumed stage I non-small cell lung cancer of the left lung.  REFERRING PROVIDER: Adin Hector, MD  HPI: Patient is a 74 year old female now out close to 5 months having completed hypofractionated radiation therapy to her left lung for stage I non-small cell lung cancer seen today in routine follow-up she is doing fair she continues to have a persistent cough is on hydrocodone for that.  Seems to be improving somewhat.  She is not having any hemoptysis.  She had a recent CT scan of her chest.  Showing mild decrease in the size of the central left lower lobe pulmonary nodule.  There is also evidence of patchy airway space opacity and atelectasis in the lingula consistent with radiation pneumonitis.  COMPLICATIONS OF TREATMENT: none  FOLLOW UP COMPLIANCE: keeps appointments   PHYSICAL EXAM:  There were no vitals taken for this visit. Well-developed well-nourished patient in NAD. HEENT reveals PERLA, EOMI, discs not visualized.  Oral cavity is clear. No oral mucosal lesions are identified. Neck is clear without evidence of cervical or supraclavicular adenopathy. Lungs are clear to A&P. Cardiac examination is essentially unremarkable with regular rate and rhythm without murmur rub or thrill. Abdomen is benign with no organomegaly or masses noted. Motor sensory and DTR levels are equal and symmetric in the upper and lower extremities. Cranial nerves II through XII are grossly intact. Proprioception is intact. No peripheral adenopathy or edema is identified. No motor or sensory levels are noted. Crude visual fields are within normal range.  RADIOLOGY RESULTS: CT scan reviewed compatible with above-stated findings  PLAN: Present time patient is doing  fairly well explained to her the necessity for her cough.  She will continue follow-up care with Dr. Caryl Comes as well as Dr. Raul Del.  She may benefit from a short course of steroids.  She continues to be on hydrocodone for her cough.  I have asked to see her back in 6 months for follow-up.  She knows to call sooner with any concerns.  I would like to take this opportunity to thank you for allowing me to participate in the care of your patient.Noreene Filbert, MD

## 2020-08-29 IMAGING — CR DG CHEST 2V
1 series · 2 of 2 positions shown · non-contrast
Comparison: 12/02/2019

CLINICAL DATA: Lung cancer status post radiation

EXAM:
CHEST - 2 VIEW

[Series 1: dg chest 2 view · 0.14mm/px · 2 of 2 slices shown]
[im 1/2]
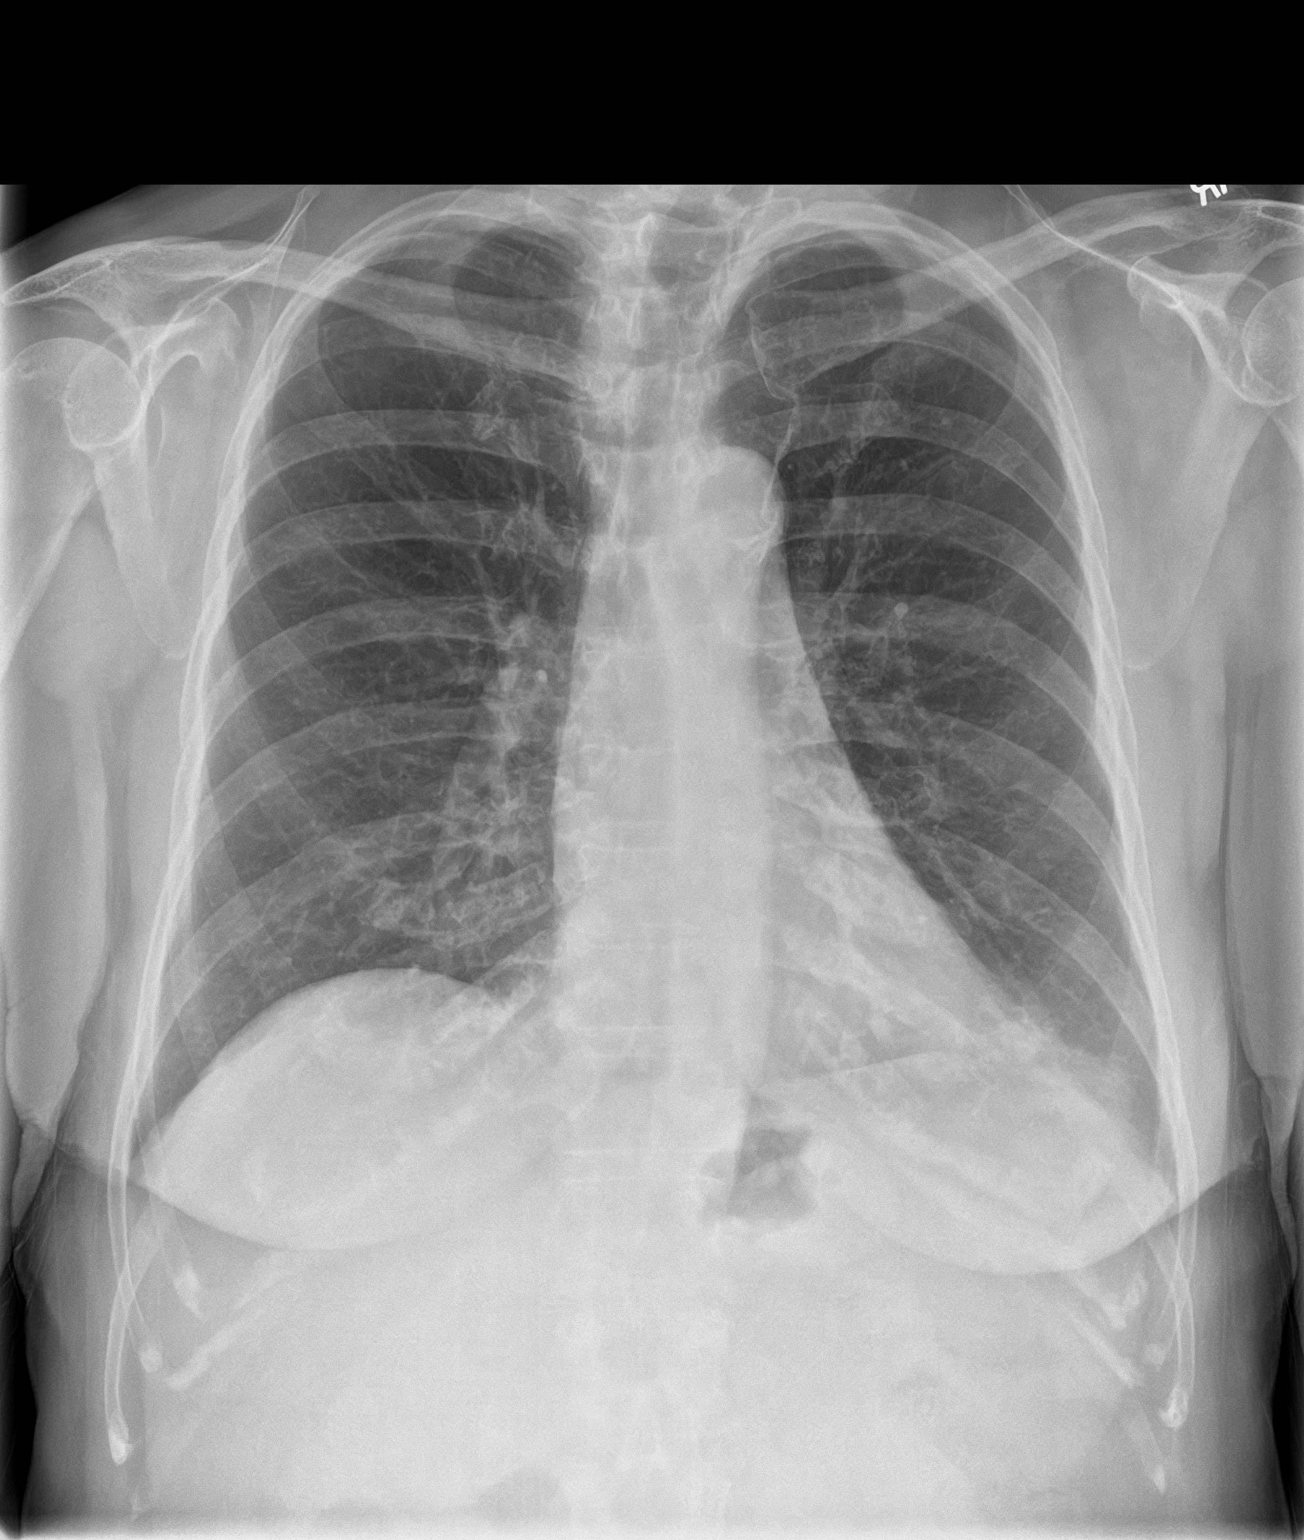
[im 2/2]
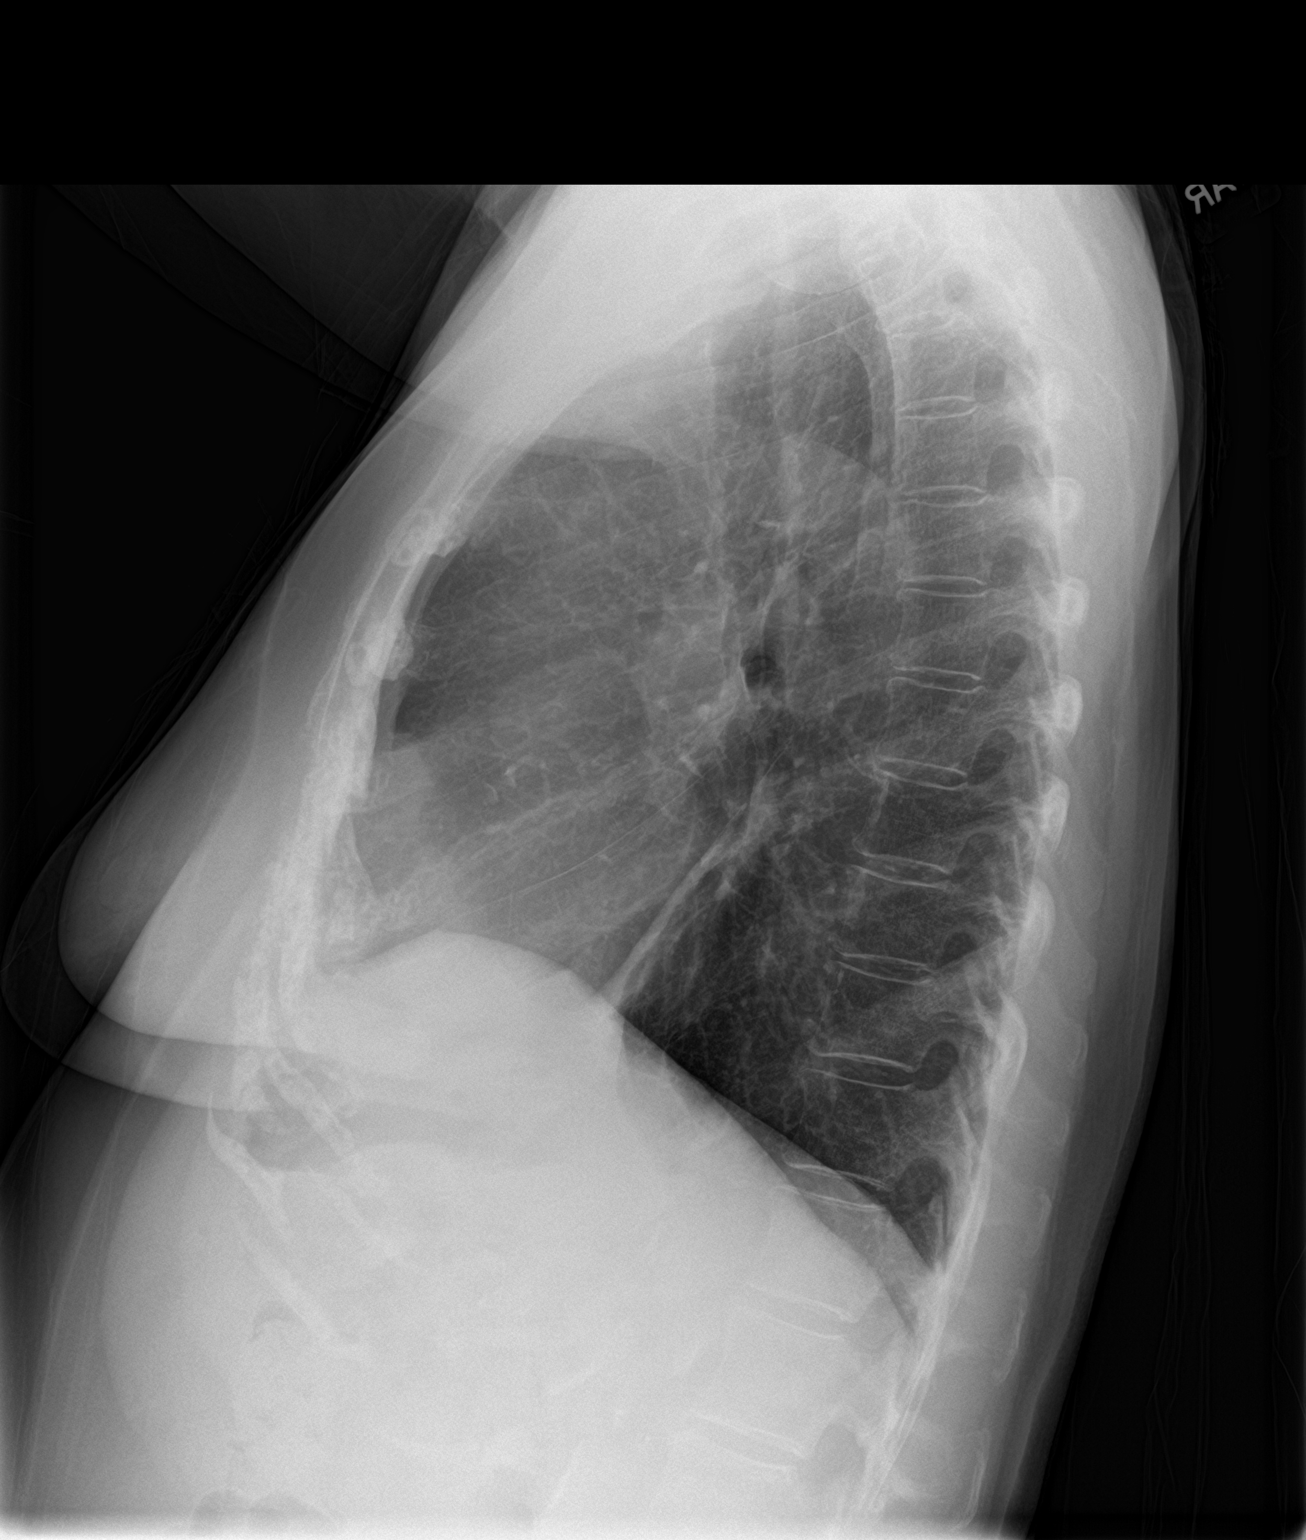

[2 of 2 positions shown; findings below may reference images not displayed]

FINDINGS: The heart size and mediastinal contours are within normal limits.
Atherosclerotic calcification of the aortic knob. Known left
perihilar nodular density is not well seen radiographically. No
focal airspace consolidation, pleural effusion, or pneumothorax. The
visualized skeletal structures are unremarkable.
IMPRESSION: 1. No active cardiopulmonary disease.
2. Known left perihilar nodular density is not well seen
radiographically, possibly reflecting treatment response.

## 2020-10-04 ENCOUNTER — Other Ambulatory Visit: Payer: Self-pay

## 2020-10-04 ENCOUNTER — Encounter: Payer: Self-pay | Admitting: Ophthalmology

## 2020-10-11 ENCOUNTER — Other Ambulatory Visit: Admission: RE | Admit: 2020-10-11 | Payer: Medicare Other | Source: Ambulatory Visit

## 2020-10-22 ENCOUNTER — Other Ambulatory Visit: Payer: Self-pay

## 2020-10-22 ENCOUNTER — Encounter: Payer: Self-pay | Admitting: Ophthalmology

## 2020-10-25 ENCOUNTER — Other Ambulatory Visit
Admission: RE | Admit: 2020-10-25 | Discharge: 2020-10-25 | Disposition: A | Discharge status: Home / Self Care | Payer: Medicare Other | Source: Ambulatory Visit | Attending: Ophthalmology | Admitting: Ophthalmology

## 2020-10-25 ENCOUNTER — Other Ambulatory Visit: Payer: Self-pay

## 2020-10-25 DIAGNOSIS — Z01812 Encounter for preprocedural laboratory examination: Secondary | ICD-10-CM | POA: Diagnosis present

## 2020-10-25 DIAGNOSIS — Z20822 Contact with and (suspected) exposure to covid-19: Secondary | ICD-10-CM | POA: Diagnosis not present

## 2020-10-25 NOTE — Discharge Instructions (Signed)

## 2020-10-26 LAB — SARS CORONAVIRUS 2 (TAT 6-24 HRS): SARS Coronavirus 2: NEGATIVE

## 2020-10-29 ENCOUNTER — Encounter
Admission: RE | Disposition: A | Discharge status: Home / Self Care | Payer: Self-pay | Source: Home / Self Care | Attending: Ophthalmology

## 2020-10-29 ENCOUNTER — Ambulatory Visit
Admission: RE | Admit: 2020-10-29 | Discharge: 2020-10-29 | Disposition: A | Discharge status: Home / Self Care | Payer: Medicare Other | Attending: Ophthalmology | Admitting: Ophthalmology

## 2020-10-29 ENCOUNTER — Ambulatory Visit: Payer: Medicare Other | Admitting: Anesthesiology

## 2020-10-29 ENCOUNTER — Encounter: Payer: Self-pay | Admitting: Ophthalmology

## 2020-10-29 ENCOUNTER — Other Ambulatory Visit: Payer: Self-pay

## 2020-10-29 DIAGNOSIS — I129 Hypertensive chronic kidney disease with stage 1 through stage 4 chronic kidney disease, or unspecified chronic kidney disease: Secondary | ICD-10-CM | POA: Insufficient documentation

## 2020-10-29 DIAGNOSIS — E1136 Type 2 diabetes mellitus with diabetic cataract: Secondary | ICD-10-CM | POA: Diagnosis not present

## 2020-10-29 DIAGNOSIS — N184 Chronic kidney disease, stage 4 (severe): Secondary | ICD-10-CM | POA: Diagnosis not present

## 2020-10-29 DIAGNOSIS — H2512 Age-related nuclear cataract, left eye: Secondary | ICD-10-CM | POA: Diagnosis not present

## 2020-10-29 DIAGNOSIS — Z794 Long term (current) use of insulin: Secondary | ICD-10-CM | POA: Insufficient documentation

## 2020-10-29 DIAGNOSIS — E1122 Type 2 diabetes mellitus with diabetic chronic kidney disease: Secondary | ICD-10-CM | POA: Diagnosis not present

## 2020-10-29 HISTORY — PX: CATARACT EXTRACTION W/PHACO: SHX586

## 2020-10-29 LAB — GLUCOSE, CAPILLARY
Glucose-Capillary: 96 mg/dL (ref 70–99)
Glucose-Capillary: 95 mg/dL (ref 70–99)

## 2020-10-29 SURGERY — PHACOEMULSIFICATION, CATARACT, WITH IOL INSERTION
Anesthesia: Monitor Anesthesia Care | Site: Eye | Laterality: Left

## 2020-10-29 MED ORDER — SODIUM HYALURONATE 23 MG/ML IO SOLN
INTRAOCULAR | Status: DC | PRN
  Administered 2020-10-29: 0.6 mL via INTRAOCULAR

## 2020-10-29 MED ORDER — FENTANYL CITRATE (PF) 100 MCG/2ML IJ SOLN
INTRAMUSCULAR | Status: DC | PRN
  Administered 2020-10-29 (×2): 25 ug via INTRAVENOUS
  Administered 2020-10-29: 50 ug via INTRAVENOUS

## 2020-10-29 MED ORDER — TETRACAINE HCL 0.5 % OP SOLN
1.0000 [drp] | OPHTHALMIC | Status: DC | PRN
Start: 2020-10-29 — End: 2020-10-29

## 2020-10-29 MED ORDER — SODIUM HYALURONATE 10 MG/ML IO SOLN
INTRAOCULAR | Status: DC | PRN
  Administered 2020-10-29: 0.55 mL via INTRAOCULAR

## 2020-10-29 MED ORDER — MIDAZOLAM HCL 2 MG/2ML IJ SOLN
INTRAMUSCULAR | Status: DC | PRN
  Administered 2020-10-29: .5 mg via INTRAVENOUS
  Administered 2020-10-29: 1 mg via INTRAVENOUS
  Administered 2020-10-29: .5 mg via INTRAVENOUS

## 2020-10-29 MED ORDER — EPINEPHRINE PF 1 MG/ML IJ SOLN
INTRAOCULAR | Status: DC | PRN
  Administered 2020-10-29: 93 mL via OPHTHALMIC

## 2020-10-29 MED ORDER — MOXIFLOXACIN HCL 0.5 % OP SOLN
OPHTHALMIC | Status: DC | PRN
  Administered 2020-10-29: 0.2 mL via OPHTHALMIC

## 2020-10-29 MED ORDER — TETRACAINE HCL 0.5 % OP SOLN
1.0000 [drp] | OPHTHALMIC | Status: DC | PRN
  Administered 2020-10-29 (×3): 1 [drp] via OPHTHALMIC

## 2020-10-29 MED ORDER — LIDOCAINE HCL (PF) 2 % IJ SOLN
INTRAOCULAR | Status: DC | PRN
  Administered 2020-10-29: 1 mL via INTRAOCULAR

## 2020-10-29 MED ORDER — ACETAMINOPHEN 325 MG PO TABS: 325.0000 mg | ORAL_TABLET | Freq: Once | ORAL | Status: DC

## 2020-10-29 MED ORDER — ACETAMINOPHEN 160 MG/5ML PO SOLN: 325.0000 mg | Freq: Once | ORAL | Status: DC

## 2020-10-29 MED ORDER — ARMC OPHTHALMIC DILATING DROPS
1.0000 "application " | OPHTHALMIC | Status: DC | PRN
  Administered 2020-10-29 (×3): 1 via OPHTHALMIC

## 2020-10-29 MED ORDER — ARMC OPHTHALMIC DILATING DROPS: 1.0000 "application " | OPHTHALMIC | Status: DC | PRN

## 2020-10-29 MED ORDER — LACTATED RINGERS IV SOLN: INTRAVENOUS | Status: DC

## 2020-10-29 SURGICAL SUPPLY — 17 items
CANNULA ANT/CHMB 27GA (MISCELLANEOUS) ×4 IMPLANT
DISSECTOR HYDRO NUCLEUS 50X22 (MISCELLANEOUS) ×2 IMPLANT
GLOVE SURG LX 7.5 STRW (GLOVE) ×2
GLOVE SURG LX STRL 7.5 STRW (GLOVE) ×2 IMPLANT
GLOVE SURG SYN 8.5  E (GLOVE) ×1
GLOVE SURG SYN 8.5 E (GLOVE) ×1 IMPLANT
GOWN STRL REUS W/ TWL LRG LVL3 (GOWN DISPOSABLE) ×2 IMPLANT
GOWN STRL REUS W/TWL LRG LVL3 (GOWN DISPOSABLE) ×4
LENS IOL TECNIS EYHANCE 20.5 (Intraocular Lens) ×2 IMPLANT
MARKER SKIN DUAL TIP RULER LAB (MISCELLANEOUS) ×2 IMPLANT
PACK DR. KING ARMS (PACKS) ×2 IMPLANT
PACK EYE AFTER SURG (MISCELLANEOUS) ×2 IMPLANT
PACK OPTHALMIC (MISCELLANEOUS) ×2 IMPLANT
SYR 3ML LL SCALE MARK (SYRINGE) ×2 IMPLANT
SYR TB 1ML LUER SLIP (SYRINGE) ×2 IMPLANT
WATER STERILE IRR 250ML POUR (IV SOLUTION) ×2 IMPLANT
WIPE NON LINTING 3.25X3.25 (MISCELLANEOUS) ×2 IMPLANT

## 2020-10-29 NOTE — Transfer of Care (Signed)
Immediate Anesthesia Transfer of Care Note  Patient: Donna Brennan  Procedure(s) Performed: CATARACT EXTRACTION PHACO AND INTRAOCULAR LENS PLACEMENT (IOC) LEFT DIABETIC 1051 01:00.0  (Left Eye)  Patient Location: PACU  Anesthesia Type: MAC  Level of Consciousness: awake, alert  and patient cooperative  Airway and Oxygen Therapy: Patient Spontanous Breathing and Patient connected to supplemental oxygen  Post-op Assessment: Post-op Vital signs reviewed, Patient's Cardiovascular Status Stable, Respiratory Function Stable, Patent Airway and No signs of Nausea or vomiting  Post-op Vital Signs: Reviewed and stable  Complications: No complications documented.

## 2020-10-29 NOTE — Anesthesia Preprocedure Evaluation (Signed)
Anesthesia Evaluation  Patient identified by MRN, date of birth, ID band Patient awake    Reviewed: Allergy & Precautions, H&P , NPO status , Patient's Chart, lab work & pertinent test results  Airway Mallampati: II  TM Distance: >3 FB Neck ROM: full    Dental no notable dental hx.    Pulmonary COPD, former smoker,    Pulmonary exam normal breath sounds clear to auscultation       Cardiovascular hypertension, + CAD  Normal cardiovascular exam Rhythm:regular Rate:Normal     Neuro/Psych    GI/Hepatic GERD  ,  Endo/Other  diabetes  Renal/GU Renal disease     Musculoskeletal   Abdominal   Peds  Hematology   Anesthesia Other Findings   Reproductive/Obstetrics                             Anesthesia Physical Anesthesia Plan  ASA: III  Anesthesia Plan: MAC   Post-op Pain Management:    Induction:   PONV Risk Score and Plan: 2 and Treatment may vary due to age or medical condition, Midazolam and TIVA  Airway Management Planned:   Additional Equipment:   Intra-op Plan:   Post-operative Plan:   Informed Consent: I have reviewed the patients History and Physical, chart, labs and discussed the procedure including the risks, benefits and alternatives for the proposed anesthesia with the patient or authorized representative who has indicated his/her understanding and acceptance.     Dental Advisory Given  Plan Discussed with: CRNA  Anesthesia Plan Comments:         Anesthesia Quick Evaluation

## 2020-10-29 NOTE — H&P (Signed)
[facility]   Primary Care Physician:  Adin Hector, MD Ophthalmologist: Dr. Benay Pillow  Pre-Procedure History & Physical: HPI:  Donna Brennan is a 75 y.o. female here for cataract surgery.   Past Medical History:  Diagnosis Date  . CKD (chronic kidney disease), stage IV ([place])   . Colon polyps   . COPD (chronic obstructive pulmonary disease) ([place])   . Diabetes mellitus without complication ([place])   . GERD (gastroesophageal reflux disease)   . Headache   . Hepatitis    when in 4th or 5th grade.  Resolved  . Hyperlipidemia   . IBS (irritable bowel syndrome)   . Peptic ulcer   . Tobacco abuse   . Vertigo    No "bad" episodes in over 1 year  . Wears dentures    partial ujpper and lower    Past Surgical History:  Procedure Laterality Date  . ABDOMINAL HYSTERECTOMY    . carbuncle removal     urethra  . COLONOSCOPY    . COLONOSCOPY WITH PROPOFOL N/A 11/05/2017   Procedure: COLONOSCOPY WITH PROPOFOL;  Surgeon: Lollie Sails, MD;  Location: [facility] ENDOSCOPY;  Service: Endoscopy;  Laterality: N/A;  . ESOPHAGOGASTRODUODENOSCOPY    . ESOPHAGOGASTRODUODENOSCOPY (EGD) WITH PROPOFOL N/A 11/05/2017   Procedure: ESOPHAGOGASTRODUODENOSCOPY (EGD) WITH PROPOFOL;  Surgeon: Lollie Sails, MD;  Location: [facility] ENDOSCOPY;  Service: Endoscopy;  Laterality: N/A;  . VIDEO BRONCHOSCOPY WITH ENDOBRONCHIAL NAVIGATION N/A 12/02/2019   Procedure: VIDEO BRONCHOSCOPY WITH ENDOBRONCHIAL NAVIGATION;  Surgeon: Ottie Glazier, MD;  Location: [facility] ORS;  Service: Thoracic;  Laterality: N/A;  . VIDEO BRONCHOSCOPY WITH ENDOBRONCHIAL ULTRASOUND N/A 12/02/2019   Procedure: VIDEO BRONCHOSCOPY WITH ENDOBRONCHIAL ULTRASOUND;  Surgeon: Ottie Glazier, MD;  Location: [facility] ORS;  Service: Thoracic;  Laterality: N/A;    Prior to Admission medications   Medication Sig Start Date End Date Taking? Authorizing Provider  acetaminophen (TYLENOL) 500 MG tablet Take 1,000 mg by mouth every 6 (six) hours as  needed (for pain.).    Yes [provider]  albuterol (VENTOLIN HFA) 108 (90 Base) MCG/ACT inhaler Inhale 1-2 puffs into the lungs every 6 (six) hours as needed for wheezing or shortness of breath. 02/10/20  Yes Cammie Sickle, MD  benzonatate (TESSALON) 200 MG capsule Take 200 mg by mouth 3 (three) times daily as needed for cough.   Yes [provider]  chlorpheniramine-HYDROcodone (TUSSIONEX) 10-8 MG/5ML SUER Take 5 mLs by mouth at bedtime as needed for cough. 06/07/20  Yes Jacquelin Hawking, NP  diazepam (VALIUM) 5 MG tablet Take 5 mg by mouth daily as needed (for vertigo).   Yes [provider]  ezetimibe (ZETIA) 10 MG tablet Take 10 mg by mouth daily.    Yes [provider]  Fluticasone-Salmeterol (ADVAIR) 250-50 MCG/DOSE AEPB Inhale 1 puff into the lungs 2 (two) times daily.   Yes [provider]  furosemide (LASIX) 20 MG tablet Take 20 mg by mouth daily as needed (fluid retention.).    Yes [provider]  hyoscyamine (LEVBID) 0.375 MG 12 hr tablet Take 0.375 mg by mouth daily as needed (abdominal cramping).  03/24/19  Yes [provider]  Ipratropium-Albuterol (COMBIVENT) 20-100 MCG/ACT AERS respimat Inhale 1 puff into the lungs every 4 (four) hours as needed for wheezing.   Yes [provider]  LANTUS SOLOSTAR 100 UNIT/ML Solostar Pen Inject 24 Units into the skin at bedtime. (10/22/20 - 20 units PM, 10 units AM) 10/25/19  Yes [provider]  Multiple Vitamin (MULTIVITAMIN WITH MINERALS) TABS tablet Take 1 tablet by mouth daily. One-A-Day for Women   Yes [provider]  nicotine polacrilex (NICORETTE) 4 MG gum Take 4 mg by mouth in the morning, at noon, and at bedtime. 11/15/19  Yes [provider]  nystatin (MYCOSTATIN) 100000 UNIT/ML suspension Take 5 mLs by mouth 2 (two) times daily as needed (throat irritation.).   Yes [provider]  omeprazole (PRILOSEC) 20 MG capsule Take 20 mg  by mouth daily before breakfast.    Yes [provider]  rosuvastatin (CRESTOR) 40 MG tablet Take 40 mg by mouth every evening.    Yes [provider]  vitamin B-12 (CYANOCOBALAMIN) 1000 MCG tablet Take 1,000 mcg by mouth daily.   Yes [provider]  predniSONE (STERAPRED UNI-PAK 21 TAB) 10 MG (21) TBPK tablet Take as directed. Patient not taking: Reported on 10/04/2020 06/07/20   Jacquelin Hawking, NP    Allergies as of 09/14/2020 - Review Complete 06/06/2020  Allergen Reaction Noted  . Penicillins Anaphylaxis and Rash 11/04/2017  . Metformin Other (See Comments) 10/18/2018  . Sulfa antibiotics Nausea And Vomiting 11/04/2017    Family History  Problem Relation Age of Onset  . Breast cancer Neg Hx     Social History   Socioeconomic History  . Marital status: Married    Spouse name: Not on file  . Number of children: Not on file  . Years of education: Not on file  . Highest education level: Not on file  Occupational History  . Not on file  Tobacco Use  . Smoking status: Former Smoker    Packs/day: 0.50    Years: 45.00    Pack years: 22.50    Types: Cigarettes    Quit date: 08/06/2020    Years since quitting: 0.2  . Smokeless tobacco: Never Used  Vaping Use  . Vaping Use: Never used  Substance and Sexual Activity  . Alcohol use: No  . Drug use: No  . Sexual activity: Not on file  Other Topics Concern  . 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 [place]     Social Determinants of Health   Financial Resource Strain: Not on file  Food Insecurity: Not on file  Transportation Needs: Not on file  Physical Activity: Not on file  Stress: Not on file  Social Connections: Not on file  Intimate Partner Violence: Not on file    Review of Systems: See HPI, otherwise negative ROS  Physical Exam: BP (!) 124/59   Pulse 75   Temp 97.6 F  (36.4 C)   Ht 5' 1.5" (1.562 m)   Wt 61.2 kg   SpO2 94%   BMI 25.09 kg/m  General:   Alert,  pleasant and cooperative in NAD Head:  Normocephalic and atraumatic. Respiratory:  Normal work of breathing.  Impression/Plan: Donna Brennan is here for cataract surgery.  Risks, benefits, limitations, and alternatives regarding cataract surgery have been reviewed with the patient.  Questions have been answered.  All parties agreeable.   Benay Pillow, MD  10/29/2020, 10:45 AM

## 2020-10-29 NOTE — Op Note (Signed)
OPERATIVE NOTE  STEFANIE HODGENS [id number] 10/29/2020   PREOPERATIVE DIAGNOSIS:  Nuclear sclerotic cataract left eye.  H25.12   POSTOPERATIVE DIAGNOSIS:    Nuclear sclerotic cataract left eye.     PROCEDURE:  Phacoemusification with posterior chamber intraocular lens placement of the left eye   LENS:   Implant Name Type Inv. Item Serial No. Manufacturer Lot No. LRB No. Used Action  LENS IOL TECNIS EYHANCE 20.5 - [id number] Intraocular Lens LENS IOL TECNIS EYHANCE 20.5 [id number] JOHNSON   Left 1 Implanted      Procedure(s) with comments: CATARACT EXTRACTION PHACO AND INTRAOCULAR LENS PLACEMENT (IOC) LEFT DIABETIC 1051 01:00.0  (Left) - Diabetic - insulin  DIB00 +20.5   ULTRASOUND TIME: 1 minutes 0 seconds.  CDE 10.51   SURGEON:  Benay Pillow, MD, MPH   ANESTHESIA:  Topical with tetracaine drops augmented with 1% preservative-free intracameral lidocaine.  ESTIMATED BLOOD LOSS: <1 mL   COMPLICATIONS:  None.   DESCRIPTION OF PROCEDURE:  The patient was identified in the holding room and transported to the operating room and placed in the supine position under the operating microscope.  The left eye was identified as the operative eye and it was prepped and draped in the usual sterile ophthalmic fashion.  The patient exhibited significant movement and responses to the prep, her head was taped.   A 1.0 millimeter clear-corneal paracentesis was made at the 5:00 position. 0.5 ml of preservative-free 1% lidocaine with epinephrine was injected into the anterior chamber.  The anterior chamber was filled with Healon 5 viscoelastic.  A 2.4 millimeter keratome was used to make a near-clear corneal incision at the 2:00 position.  A curvilinear capsulorrhexis was made with a cystotome and capsulorrhexis forceps.  Balanced salt solution was used to hydrodissect and hydrodelineate the nucleus.   Phacoemulsification was then used in stop and chop fashion to remove the lens nucleus and epinucleus.   The remaining cortex was then removed using the irrigation and aspiration handpiece. Healon was then placed into the capsular bag to distend it for lens placement.  A lens was then injected into the capsular bag.  The remaining viscoelastic was aspirated.   Wounds were hydrated with balanced salt solution.  The anterior chamber was inflated to a physiologic pressure with balanced salt solution.   Intracameral vigamox 0.1 mL undiltued was injected into the eye and a drop placed onto the ocular surface.  No wound leaks were noted.  The patient was taken to the recovery room in stable condition without complications of anesthesia or surgery  Benay Pillow 10/29/2020, 11:24 AM

## 2020-10-29 NOTE — Anesthesia Procedure Notes (Signed)
Date/Time: 10/29/2020 10:54 AM Performed by: Dionne Bucy, CRNA Pre-anesthesia Checklist: Patient identified, Emergency Drugs available, Suction available, Patient being monitored and Timeout performed Oxygen Delivery Method: Nasal cannula Placement Confirmation: positive ETCO2

## 2020-10-29 NOTE — Anesthesia Postprocedure Evaluation (Signed)
Anesthesia Post Note  Patient: Donna Brennan  Procedure(s) Performed: CATARACT EXTRACTION PHACO AND INTRAOCULAR LENS PLACEMENT (IOC) LEFT DIABETIC 1051 01:00.0  (Left Eye)     Patient location during evaluation: PACU Anesthesia Type: MAC Level of consciousness: awake and alert and oriented Pain management: satisfactory to patient Vital Signs Assessment: post-procedure vital signs reviewed and stable Respiratory status: spontaneous breathing, nonlabored ventilation and respiratory function stable Cardiovascular status: blood pressure returned to baseline and stable Postop Assessment: Adequate PO intake and No signs of nausea or vomiting Anesthetic complications: no   No complications documented.  Raliegh Ip

## 2020-10-30 ENCOUNTER — Encounter: Payer: Self-pay | Admitting: Ophthalmology

## 2020-11-05 ENCOUNTER — Encounter: Payer: Self-pay | Admitting: Ophthalmology

## 2020-11-06 ENCOUNTER — Ambulatory Visit
Admission: RE | Admit: 2020-11-06 | Discharge: 2020-11-06 | Disposition: A | Discharge status: Home / Self Care | Payer: Medicare Other | Source: Ambulatory Visit | Attending: Internal Medicine | Admitting: Internal Medicine

## 2020-11-06 ENCOUNTER — Other Ambulatory Visit: Payer: Self-pay

## 2020-11-06 DIAGNOSIS — C3432 Malignant neoplasm of lower lobe, left bronchus or lung: Secondary | ICD-10-CM | POA: Insufficient documentation

## 2020-11-09 ENCOUNTER — Inpatient Hospital Stay: Payer: Medicare Other | Attending: Internal Medicine

## 2020-11-09 ENCOUNTER — Inpatient Hospital Stay (HOSPITAL_BASED_OUTPATIENT_CLINIC_OR_DEPARTMENT_OTHER): Payer: Medicare Other | Admitting: Internal Medicine

## 2020-11-09 VITALS — BP 110/62 | HR 90 | Temp 98.2°F | Resp 18 | Wt 134.5 lb

## 2020-11-09 DIAGNOSIS — C3432 Malignant neoplasm of lower lobe, left bronchus or lung: Secondary | ICD-10-CM | POA: Diagnosis not present

## 2020-11-09 DIAGNOSIS — R911 Solitary pulmonary nodule: Secondary | ICD-10-CM | POA: Insufficient documentation

## 2020-11-09 DIAGNOSIS — J449 Chronic obstructive pulmonary disease, unspecified: Secondary | ICD-10-CM | POA: Diagnosis not present

## 2020-11-09 DIAGNOSIS — Z87891 Personal history of nicotine dependence: Secondary | ICD-10-CM | POA: Insufficient documentation

## 2020-11-09 LAB — CBC WITH DIFFERENTIAL/PLATELET
Abs Immature Granulocytes: 0.01 10*3/uL (ref 0.00–0.07)
Basophils Absolute: 0 10*3/uL (ref 0.0–0.1)
Basophils Relative: 1 %
Eosinophils Absolute: 0.1 10*3/uL (ref 0.0–0.5)
Eosinophils Relative: 2 %
HCT: 37.9 % (ref 36.0–46.0)
Hemoglobin: 12.9 g/dL (ref 12.0–15.0)
Immature Granulocytes: 0 %
Lymphocytes Relative: 13 %
Lymphs Abs: 0.9 10*3/uL (ref 0.7–4.0)
MCH: 30.8 pg (ref 26.0–34.0)
MCHC: 34 g/dL (ref 30.0–36.0)
MCV: 90.5 fL (ref 80.0–100.0)
Monocytes Absolute: 0.8 10*3/uL (ref 0.1–1.0)
Monocytes Relative: 12 %
Neutro Abs: 4.7 10*3/uL (ref 1.7–7.7)
Neutrophils Relative %: 72 %
Platelets: 287 10*3/uL (ref 150–400)
RBC: 4.19 MIL/uL (ref 3.87–5.11)
RDW: 12.9 % (ref 11.5–15.5)
WBC: 6.5 10*3/uL (ref 4.0–10.5)
nRBC: 0 % (ref 0.0–0.2)

## 2020-11-09 LAB — COMPREHENSIVE METABOLIC PANEL
ALT: 16 U/L (ref 0–44)
AST: 18 U/L (ref 15–41)
Albumin: 2.6 g/dL — ABNORMAL LOW (ref 3.5–5.0)
Alkaline Phosphatase: 76 U/L (ref 38–126)
Anion gap: 9 (ref 5–15)
BUN: 13 mg/dL (ref 8–23)
CO2: 23 mmol/L (ref 22–32)
Calcium: 8.4 mg/dL — ABNORMAL LOW (ref 8.9–10.3)
Chloride: 104 mmol/L (ref 98–111)
Creatinine, Ser: 1.1 mg/dL — ABNORMAL HIGH (ref 0.44–1.00)
GFR, Estimated: 52 mL/min — ABNORMAL LOW (ref >60)
Glucose, Bld: 230 mg/dL — ABNORMAL HIGH (ref 70–99)
Potassium: 4.2 mmol/L (ref 3.5–5.1)
Sodium: 136 mmol/L (ref 135–145)
Total Bilirubin: 0.4 mg/dL (ref 0.3–1.2)
Total Protein: 6.4 g/dL — ABNORMAL LOW (ref 6.5–8.1)

## 2020-11-09 NOTE — Progress Notes (Signed)
[place] NOTE  Patient Care Team: Adin Hector, MD as PCP - General (Internal Medicine) Telford Nab, RN as Oncology Nurse Navigator Erby Pian, MD as Referring Physician (Specialist) Cammie Sickle, MD as Consulting Physician (Internal Medicine) Noreene Filbert, MD as Referring Physician (Radiation Oncology)  CHIEF COMPLAINTS/PURPOSE OF CONSULTATION: Lung cancer  #  Oncology History Overview Note  #FEB 2021- LUNG, LEFT LOWER LOBE; ENB BRONCHOALVEOLAR LAVAGE:  - SUSPICIOUS FOR MALIGNANCY. 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  female history of smoking left lower lobe lung cancer/non-small cell s/p SBRT is here for follow-up; review results of the CT scan.  Patient continues to complain of chronic shortness of breath.  No hemoptysis.  This is not any significantly worse from baseline.   Patient denies any nausea vomiting.  Denies any headaches.  Review of Systems  Constitutional: Positive for malaise/fatigue. Negative for chills, diaphoresis, fever and weight loss.  HENT: Negative for nosebleeds and sore throat.   Eyes: Negative for double vision.  Respiratory: Positive for cough and shortness of breath. Negative for hemoptysis, sputum production and wheezing.   Cardiovascular: Negative for chest pain, palpitations, orthopnea and leg swelling.  Gastrointestinal: Negative for abdominal pain, blood in stool, constipation, diarrhea, heartburn, melena, nausea and vomiting.  Genitourinary: Negative for dysuria, frequency and urgency.  Musculoskeletal: Positive for back pain and joint pain.  Skin: Negative.  Negative for itching and  rash.  Neurological: Negative for dizziness, tingling, focal weakness, weakness and headaches.  Endo/Heme/Allergies: Does not bruise/bleed easily.  Psychiatric/Behavioral: Negative for depression. The patient is not nervous/anxious and does not have insomnia.      MEDICAL HISTORY:  Past Medical History:  Diagnosis Date  . CKD (chronic kidney disease), stage IV ([place])   . Colon polyps   . COPD (chronic obstructive pulmonary disease) ([place])   . Diabetes mellitus without complication ([place])   . GERD (gastroesophageal reflux disease)   . Headache   . Hepatitis    when in 4th or 5th grade.  Resolved  . Hyperlipidemia   . IBS (irritable bowel syndrome)   . Peptic ulcer   . Tobacco abuse   . Vertigo    No "bad" episodes in over 1 year  . Wears dentures    partial ujpper and lower    SURGICAL HISTORY: Past Surgical History:  Procedure Laterality Date  . ABDOMINAL HYSTERECTOMY    . carbuncle removal     urethra  . CATARACT EXTRACTION W/PHACO Left 10/29/2020   Procedure: CATARACT EXTRACTION PHACO AND INTRAOCULAR LENS PLACEMENT (IOC) LEFT DIABETIC 1051 01:00.0 ;  Surgeon: Eulogio Bear, MD;  Location: [place];  Service: Ophthalmology;  Laterality: Left;  Diabetic - insulin  . COLONOSCOPY    . COLONOSCOPY WITH PROPOFOL N/A 11/05/2017   Procedure: COLONOSCOPY WITH PROPOFOL;  Surgeon: Lollie Sails, MD;  Location: [facility] ENDOSCOPY;  Service: Endoscopy;  Laterality: N/A;  . ESOPHAGOGASTRODUODENOSCOPY    . ESOPHAGOGASTRODUODENOSCOPY (EGD) WITH PROPOFOL N/A 11/05/2017   Procedure: ESOPHAGOGASTRODUODENOSCOPY (EGD) WITH PROPOFOL;  Surgeon: Lollie Sails, MD;  Location: [facility] ENDOSCOPY;  Service: Endoscopy;  Laterality: N/A;  . VIDEO BRONCHOSCOPY WITH ENDOBRONCHIAL NAVIGATION N/A 12/02/2019  Procedure: VIDEO BRONCHOSCOPY WITH ENDOBRONCHIAL NAVIGATION;  Surgeon: Ottie Glazier, MD;  Location: [facility] ORS;  Service: Thoracic;  Laterality: N/A;  . VIDEO BRONCHOSCOPY WITH  ENDOBRONCHIAL ULTRASOUND N/A 12/02/2019   Procedure: VIDEO BRONCHOSCOPY WITH ENDOBRONCHIAL ULTRASOUND;  Surgeon: Ottie Glazier, MD;  Location: [facility] ORS;  Service: Thoracic;  Laterality: N/A;    SOCIAL HISTORY: Social History   Socioeconomic History  . Marital status: Married    Spouse name: Not on file  . Number of children: Not on file  . Years of education: Not on file  . Highest education level: Not on file  Occupational History  . Not on file  Tobacco Use  . Smoking status: Former Smoker    Packs/day: 0.50    Years: 45.00    Pack years: 22.50    Types: Cigarettes    Quit date: 08/06/2020    Years since quitting: 0.2  . Smokeless tobacco: Never Used  Vaping Use  . Vaping Use: Never used  Substance and Sexual Activity  . Alcohol use: No  . Drug use: No  . Sexual activity: Not on file  Other Topics Concern  . 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 [place]     Social Determinants of Health   Financial Resource Strain: Not on file  Food Insecurity: Not on file  Transportation Needs: Not on file  Physical Activity: Not on file  Stress: Not on file  Social Connections: Not on file  Intimate Partner Violence: Not on file    FAMILY HISTORY: Family History  Problem Relation Age of Onset  . Breast cancer Neg Hx     ALLERGIES:  is allergic to penicillins, metformin, and sulfa antibiotics.  MEDICATIONS:  Current Outpatient Medications  Medication Sig Dispense Refill  . acetaminophen (TYLENOL) 500 MG tablet Take 1,000 mg by mouth every 6 (six) hours as needed (for pain.).     Marland Kitchen albuterol (VENTOLIN HFA) 108 (90 Base) MCG/ACT inhaler Inhale 1-2 puffs into the lungs every 6 (six) hours as needed for wheezing or shortness of breath. 8 g 1  . chlorpheniramine-HYDROcodone (TUSSIONEX) 10-8 MG/5ML SUER Take 5 mLs by mouth at bedtime as needed for cough. 140 mL 0  .  diazepam (VALIUM) 5 MG tablet Take 5 mg by mouth daily as needed (for vertigo).    . ezetimibe (ZETIA) 10 MG tablet Take 10 mg by mouth daily.     . Fluticasone-Salmeterol (ADVAIR) 250-50 MCG/DOSE AEPB Inhale 1 puff into the lungs 2 (two) times daily.    . furosemide (LASIX) 20 MG tablet Take 20 mg by mouth daily as needed (fluid retention.).     Marland Kitchen hyoscyamine (LEVBID) 0.375 MG 12 hr tablet Take 0.375 mg by mouth daily as needed (abdominal cramping).     . Ipratropium-Albuterol (COMBIVENT) 20-100 MCG/ACT AERS respimat Inhale 1 puff into the lungs every 4 (four) hours as needed for wheezing.    Marland Kitchen LANTUS SOLOSTAR 100 UNIT/ML Solostar Pen Inject 12-20 Units into the skin 2 (two) times daily. 12U in AM, 20U in PM    . Multiple Vitamin (MULTIVITAMIN WITH MINERALS) TABS tablet Take 1 tablet by mouth daily. One-A-Day for Women    . nicotine polacrilex (NICORETTE) 4 MG gum Take 4 mg by mouth in the morning, at noon, and at bedtime.    Marland Kitchen omeprazole (PRILOSEC) 20 MG capsule Take 20 mg by mouth daily before breakfast.     .  rosuvastatin (CRESTOR) 40 MG tablet Take 40 mg by mouth every evening.     . vitamin B-12 (CYANOCOBALAMIN) 1000 MCG tablet Take 1,000 mcg by mouth daily.    . benzonatate (TESSALON) 200 MG capsule Take 200 mg by mouth 3 (three) times daily as needed for cough. (Patient not taking: Reported on 11/09/2020)    . nystatin (MYCOSTATIN) 100000 UNIT/ML suspension Take 5 mLs by mouth 2 (two) times daily as needed (throat irritation.). (Patient not taking: Reported on 11/09/2020)     No current facility-administered medications for this visit.      Marland Kitchen  PHYSICAL EXAMINATION: ECOG PERFORMANCE STATUS: 0 - Asymptomatic  Vitals:   11/09/20 1052  BP: 110/62  Pulse: 90  Resp: 18  Temp: 98.2 F (36.8 C)  SpO2: 99%   Filed Weights   11/09/20 1052  Weight: 134 lb 8 oz (61 kg)    Physical Exam Constitutional:      Comments: Thin built Caucasian female patient.  She is ambulating  independently.  Accompanied by daughter.  HENT:     Head: Normocephalic and atraumatic.     Mouth/Throat:     Pharynx: No oropharyngeal exudate.  Eyes:     Pupils: Pupils are equal, round, and reactive to light.  Cardiovascular:     Rate and Rhythm: Normal rate and regular rhythm.  Pulmonary:     Effort: No respiratory distress.     Breath sounds: No wheezing.     Comments: Decreased air entry bilaterally.  No wheeze or crackles. Abdominal:     General: Bowel sounds are normal. There is no distension.     Palpations: Abdomen is soft. There is no mass.     Tenderness: There is no abdominal tenderness. There is no guarding or rebound.  Musculoskeletal:        General: No tenderness. Normal range of motion.     Cervical back: Normal range of motion and neck supple.  Skin:    General: Skin is warm.  Neurological:     Mental Status: She is alert and oriented to person, place, and time.  Psychiatric:        Mood and Affect: Affect normal.    LABORATORY DATA:  I have reviewed the data as listed Lab Results  Component Value Date   WBC 6.5 11/09/2020   HGB 12.9 11/09/2020   HCT 37.9 11/09/2020   MCV 90.5 11/09/2020   PLT 287 11/09/2020   Recent Labs    05/02/20 1303 11/09/20 1009  NA  --  136  K  --  4.2  CL  --  104  CO2  --  23  GLUCOSE  --  230*  BUN  --  13  CREATININE 1.10* 1.10*  CALCIUM  --  8.4*  GFRNONAA  --  52*  PROT  --  6.4*  ALBUMIN  --  2.6*  AST  --  18  ALT  --  16  ALKPHOS  --  76  BILITOT  --  0.4    RADIOGRAPHIC STUDIES: I have personally reviewed the radiological images as listed and agreed with the findings in the report. CT Chest Wo Contrast  Result Date: 11/06/2020 CLINICAL DATA:  Lung cancer, assess treatment response. Radiation completed April 2021. Persistent shortness of breath. EXAM: CT CHEST WITHOUT CONTRAST TECHNIQUE: Multidetector CT imaging of the chest was performed following the standard protocol without IV contrast. COMPARISON:   05/02/2020. FINDINGS: Cardiovascular: Atherosclerotic calcification of the aorta, aortic valve and coronary arteries.  Heart size normal. No pericardial effusion. Mediastinum/Nodes: Low internal jugular and high left paratracheal lymph nodes are subcentimeter in size but have increased in size from 05/02/2020. Index low left internal jugular lymph node measures 6 mm (2/14), compared to 3 mm. High left paratracheal lymph node measures 7 mm (2/20), compared to 3 mm. Additional mediastinal lymph nodes have also increased in size in the interval. Index low right paratracheal lymph node measures 11 mm (2/57), previously 4 mm. Hilar regions are difficult to evaluate without IV contrast. No axillary adenopathy. Esophagus is grossly unremarkable. Lungs/Pleura: Post radiation bronchiectasis, scarring and volume loss in the left upper lobe. Small nodules in the superior segment left lower lobe (3/74 and 78) are unchanged. However, there is new septal thickening and peribronchovascular nodularity more inferiorly within the left lower lobe. Trace pleural fluid or pleural thickening on the left. Airway is unremarkable. Upper Abdomen: There is a new vague rounded area decreased attenuation in the peripheral right hepatic lobe, measuring 3.2 x 3.5 cm (2/139). Well-circumscribed low-attenuation lesions in the left hepatic lobe measure up to 3.7 cm, previously characterized as cysts. Visualized portions of the liver, gallbladder, adrenal glands, kidneys, spleen, pancreas, stomach and bowel are otherwise unremarkable with the exception of a tiny hiatal hernia. Periportal lymph nodes are not enlarged by CT size criteria. Musculoskeletal: No worrisome lytic or sclerotic lesions. IMPRESSION: 1. 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 2. Aortic atherosclerosis (ICD10-I70.0). Coronary  artery calcification. Electronically Signed   By: Lorin Picket M.D.   On: 11/06/2020 11:57    ASSESSMENT & PLAN:   Primary cancer of left lower lobe of lung (HCC) #Clinically stage I non-small cell lung cancer-biopsy-dysplastic cells suspicious of malignancy s/but no invasion noted available limited sample. S/p SBRT [April 2021]. 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   #Reviewed the above concerning findings for recurrence based on CT scan noncontrast.  I would recommend a PET scan for further evaluation.  Also discussed the need for liver biopsy-to confirm malignancy/NGS.  #Shortness of breath-chronic likely secondary COPD/follow-up with Dr. Raul Del..   # DISPOSITION: call- Rhonda/ [phone]; PET/liver biopsy # PET scan ASAP- # Follow up TBD- Dr.B-  # I reviewed the blood work- with the patient in detail; also reviewed the imaging independently [as summarized above]; and with the patient in detail.    Cc; Dr.Fleming/Dr.Crystal/Dr.KLein     All questions were answered. The patient knows to call the clinic with any problems, questions or concerns.    Cammie Sickle, MD 11/10/2020 7:51 AM

## 2020-11-09 NOTE — Assessment & Plan Note (Addendum)
#  Clinically stage I non-small cell lung cancer-biopsy-dysplastic cells suspicious of malignancy s/but no invasion noted available limited sample. S/p SBRT [April 2021]. 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   #Reviewed the above concerning findings for recurrence based on CT scan noncontrast.  I would recommend a PET scan for further evaluation.  Also discussed the need for liver biopsy-to confirm malignancy/NGS.  #Shortness of breath-chronic likely secondary COPD/follow-up with Dr. Raul Del..   # DISPOSITION: call- Rhonda/ [phone]; PET/liver biopsy # PET scan ASAP- # Follow up TBD- Dr.B-  # I reviewed the blood work- with the patient in detail; also reviewed the imaging independently [as summarized above]; and with the patient in detail.    Cc; Dr.Fleming/Dr.Crystal/Dr.KLein

## 2020-11-12 ENCOUNTER — Other Ambulatory Visit: Payer: Medicare Other

## 2020-11-12 ENCOUNTER — Ambulatory Visit: Payer: Medicare Other | Admitting: Internal Medicine

## 2020-11-15 ENCOUNTER — Other Ambulatory Visit
Admission: RE | Admit: 2020-11-15 | Discharge: 2020-11-15 | Disposition: A | Discharge status: Home / Self Care | Payer: Medicare Other | Source: Ambulatory Visit | Attending: Ophthalmology | Admitting: Ophthalmology

## 2020-11-15 ENCOUNTER — Other Ambulatory Visit: Payer: Self-pay

## 2020-11-15 DIAGNOSIS — Z01812 Encounter for preprocedural laboratory examination: Secondary | ICD-10-CM | POA: Diagnosis present

## 2020-11-15 DIAGNOSIS — Z20822 Contact with and (suspected) exposure to covid-19: Secondary | ICD-10-CM | POA: Insufficient documentation

## 2020-11-15 LAB — SARS CORONAVIRUS 2 (TAT 6-24 HRS): SARS Coronavirus 2: NEGATIVE

## 2020-11-16 NOTE — Discharge Instructions (Signed)

## 2020-11-19 ENCOUNTER — Encounter: Payer: Self-pay | Admitting: Ophthalmology

## 2020-11-19 ENCOUNTER — Other Ambulatory Visit: Payer: Self-pay

## 2020-11-19 ENCOUNTER — Ambulatory Visit
Admission: RE | Admit: 2020-11-19 | Discharge: 2020-11-19 | Disposition: A | Discharge status: Home / Self Care | Payer: Medicare Other | Attending: Ophthalmology | Admitting: Ophthalmology

## 2020-11-19 ENCOUNTER — Encounter
Admission: RE | Disposition: A | Discharge status: Home / Self Care | Payer: Self-pay | Source: Home / Self Care | Attending: Ophthalmology

## 2020-11-19 ENCOUNTER — Ambulatory Visit: Payer: Medicare Other | Admitting: Anesthesiology

## 2020-11-19 DIAGNOSIS — Z888 Allergy status to other drugs, medicaments and biological substances status: Secondary | ICD-10-CM | POA: Diagnosis not present

## 2020-11-19 DIAGNOSIS — E1136 Type 2 diabetes mellitus with diabetic cataract: Secondary | ICD-10-CM | POA: Diagnosis present

## 2020-11-19 DIAGNOSIS — Z79899 Other long term (current) drug therapy: Secondary | ICD-10-CM | POA: Diagnosis not present

## 2020-11-19 DIAGNOSIS — Z87891 Personal history of nicotine dependence: Secondary | ICD-10-CM | POA: Diagnosis not present

## 2020-11-19 DIAGNOSIS — Z882 Allergy status to sulfonamides status: Secondary | ICD-10-CM | POA: Insufficient documentation

## 2020-11-19 DIAGNOSIS — Z794 Long term (current) use of insulin: Secondary | ICD-10-CM | POA: Diagnosis not present

## 2020-11-19 DIAGNOSIS — Z9842 Cataract extraction status, left eye: Secondary | ICD-10-CM | POA: Insufficient documentation

## 2020-11-19 DIAGNOSIS — H2511 Age-related nuclear cataract, right eye: Secondary | ICD-10-CM | POA: Diagnosis not present

## 2020-11-19 DIAGNOSIS — Z880 Allergy status to penicillin: Secondary | ICD-10-CM | POA: Insufficient documentation

## 2020-11-19 HISTORY — DX: Inflammatory liver disease, unspecified: K75.9

## 2020-11-19 HISTORY — PX: CATARACT EXTRACTION W/PHACO: SHX586

## 2020-11-19 HISTORY — DX: Dizziness and giddiness: R42

## 2020-11-19 HISTORY — DX: Chronic kidney disease, stage 4 (severe): N18.4

## 2020-11-19 HISTORY — DX: Presence of dental prosthetic device (complete) (partial): Z97.2

## 2020-11-19 LAB — GLUCOSE, CAPILLARY
Glucose-Capillary: 155 mg/dL — ABNORMAL HIGH (ref 70–99)
Glucose-Capillary: 131 mg/dL — ABNORMAL HIGH (ref 70–99)

## 2020-11-19 SURGERY — PHACOEMULSIFICATION, CATARACT, WITH IOL INSERTION
Anesthesia: Monitor Anesthesia Care | Site: Eye | Laterality: Right

## 2020-11-19 MED ORDER — SODIUM HYALURONATE 10 MG/ML IO SOLN
INTRAOCULAR | Status: DC | PRN
  Administered 2020-11-19: 0.55 mL via INTRAOCULAR

## 2020-11-19 MED ORDER — TETRACAINE HCL 0.5 % OP SOLN
1.0000 [drp] | OPHTHALMIC | Status: DC | PRN
  Administered 2020-11-19 (×3): 1 [drp] via OPHTHALMIC

## 2020-11-19 MED ORDER — MIDAZOLAM HCL 2 MG/2ML IJ SOLN
INTRAMUSCULAR | Status: DC | PRN
  Administered 2020-11-19: 2 mg via INTRAVENOUS

## 2020-11-19 MED ORDER — FENTANYL CITRATE (PF) 100 MCG/2ML IJ SOLN
INTRAMUSCULAR | Status: DC | PRN
  Administered 2020-11-19 (×2): 50 ug via INTRAVENOUS

## 2020-11-19 MED ORDER — LIDOCAINE HCL (PF) 2 % IJ SOLN
INTRAOCULAR | Status: DC | PRN
  Administered 2020-11-19: 1 mL via INTRAOCULAR

## 2020-11-19 MED ORDER — EPINEPHRINE PF 1 MG/ML IJ SOLN
INTRAOCULAR | Status: DC | PRN
  Administered 2020-11-19: 144 mL via OPHTHALMIC

## 2020-11-19 MED ORDER — SODIUM HYALURONATE 23 MG/ML IO SOLN
INTRAOCULAR | Status: DC | PRN
  Administered 2020-11-19: 0.6 mL via INTRAOCULAR

## 2020-11-19 MED ORDER — LACTATED RINGERS IV SOLN: INTRAVENOUS | Status: DC

## 2020-11-19 MED ORDER — MOXIFLOXACIN HCL 0.5 % OP SOLN
OPHTHALMIC | Status: DC | PRN
  Administered 2020-11-19: 0.2 mL via OPHTHALMIC

## 2020-11-19 MED ORDER — ARMC OPHTHALMIC DILATING DROPS
1.0000 "application " | OPHTHALMIC | Status: DC | PRN
  Administered 2020-11-19 (×3): 1 via OPHTHALMIC

## 2020-11-19 SURGICAL SUPPLY — 19 items
CANNULA ANT/CHMB 27G (MISCELLANEOUS) ×2 IMPLANT
CANNULA ANT/CHMB 27GA (MISCELLANEOUS) ×4 IMPLANT
DISSECTOR HYDRO NUCLEUS 50X22 (MISCELLANEOUS) ×2 IMPLANT
GLOVE SURG LX 7.5 STRW (GLOVE) ×1
GLOVE SURG LX STRL 7.5 STRW (GLOVE) ×1 IMPLANT
GLOVE SURG SYN 8.5  E (GLOVE) ×2
GLOVE SURG SYN 8.5 E (GLOVE) ×2 IMPLANT
GLOVE SURG SYN 8.5 PF PI (GLOVE) ×1 IMPLANT
GOWN STRL REUS W/ TWL LRG LVL3 (GOWN DISPOSABLE) ×2 IMPLANT
GOWN STRL REUS W/TWL LRG LVL3 (GOWN DISPOSABLE) ×4
LENS IOL TECNIS EYHANCE 23.0 (Intraocular Lens) ×1 IMPLANT
MARKER SKIN DUAL TIP RULER LAB (MISCELLANEOUS) ×2 IMPLANT
PACK DR. KING ARMS (PACKS) ×2 IMPLANT
PACK EYE AFTER SURG (MISCELLANEOUS) ×2 IMPLANT
PACK OPTHALMIC (MISCELLANEOUS) ×2 IMPLANT
SYR 3ML LL SCALE MARK (SYRINGE) ×2 IMPLANT
SYR TB 1ML LUER SLIP (SYRINGE) ×2 IMPLANT
WATER STERILE IRR 250ML POUR (IV SOLUTION) ×2 IMPLANT
WIPE NON LINTING 3.25X3.25 (MISCELLANEOUS) ×2 IMPLANT

## 2020-11-19 NOTE — Anesthesia Procedure Notes (Signed)
Procedure Name: MAC Date/Time: 11/19/2020 7:34 AM Performed by: Jeannene Patella, CRNA Pre-anesthesia Checklist: Patient identified, Emergency Drugs available, Suction available, Timeout performed and Patient being monitored Patient Re-evaluated:Patient Re-evaluated prior to induction Oxygen Delivery Method: Nasal cannula Placement Confirmation: positive ETCO2

## 2020-11-19 NOTE — Transfer of Care (Signed)
Immediate Anesthesia Transfer of Care Note  Patient: Donna Brennan  Procedure(s) Performed: CATARACT EXTRACTION PHACO AND INTRAOCULAR LENS PLACEMENT (IOC) RIGHT DIABETIC (Right Eye)  Patient Location: PACU  Anesthesia Type: MAC  Level of Consciousness: awake, alert  and patient cooperative  Airway and Oxygen Therapy: Patient Spontanous Breathing and Patient connected to supplemental oxygen  Post-op Assessment: Post-op Vital signs reviewed, Patient's Cardiovascular Status Stable, Respiratory Function Stable, Patent Airway and No signs of Nausea or vomiting  Post-op Vital Signs: Reviewed and stable  Complications: No complications documented.

## 2020-11-19 NOTE — H&P (Signed)
[facility]   Primary Care Physician:  Adin Hector, MD Ophthalmologist: Dr. Benay Pillow  Pre-Procedure History & Physical: HPI:  Donna Brennan is a 75 y.o. female here for cataract surgery.   Past Medical History:  Diagnosis Date  . CKD (chronic kidney disease), stage IV ([place])   . Colon polyps   . COPD (chronic obstructive pulmonary disease) ([place])   . Diabetes mellitus without complication ([place])   . GERD (gastroesophageal reflux disease)   . Headache   . Hepatitis    when in 4th or 5th grade.  Resolved  . Hyperlipidemia   . IBS (irritable bowel syndrome)   . Peptic ulcer   . Tobacco abuse   . Vertigo    No "bad" episodes in over 1 year  . Wears dentures    partial ujpper and lower    Past Surgical History:  Procedure Laterality Date  . ABDOMINAL HYSTERECTOMY    . carbuncle removal     urethra  . CATARACT EXTRACTION W/PHACO Left 10/29/2020   Procedure: CATARACT EXTRACTION PHACO AND INTRAOCULAR LENS PLACEMENT (IOC) LEFT DIABETIC 1051 01:00.0 ;  Surgeon: Eulogio Bear, MD;  Location: [facility];  Service: Ophthalmology;  Laterality: Left;  Diabetic - insulin  . COLONOSCOPY    . COLONOSCOPY WITH PROPOFOL N/A 11/05/2017   Procedure: COLONOSCOPY WITH PROPOFOL;  Surgeon: Lollie Sails, MD;  Location: [facility] ENDOSCOPY;  Service: Endoscopy;  Laterality: N/A;  . ESOPHAGOGASTRODUODENOSCOPY    . ESOPHAGOGASTRODUODENOSCOPY (EGD) WITH PROPOFOL N/A 11/05/2017   Procedure: ESOPHAGOGASTRODUODENOSCOPY (EGD) WITH PROPOFOL;  Surgeon: Lollie Sails, MD;  Location: [facility] ENDOSCOPY;  Service: Endoscopy;  Laterality: N/A;  . VIDEO BRONCHOSCOPY WITH ENDOBRONCHIAL NAVIGATION N/A 12/02/2019   Procedure: VIDEO BRONCHOSCOPY WITH ENDOBRONCHIAL NAVIGATION;  Surgeon: Ottie Glazier, MD;  Location: [facility] ORS;  Service: Thoracic;  Laterality: N/A;  . VIDEO BRONCHOSCOPY WITH ENDOBRONCHIAL ULTRASOUND N/A 12/02/2019   Procedure: VIDEO BRONCHOSCOPY WITH ENDOBRONCHIAL ULTRASOUND;   Surgeon: Ottie Glazier, MD;  Location: [facility] ORS;  Service: Thoracic;  Laterality: N/A;    Prior to Admission medications   Medication Sig Start Date End Date Taking? Authorizing Provider  acetaminophen (TYLENOL) 500 MG tablet Take 1,000 mg by mouth every 6 (six) hours as needed (for pain.).    Yes [provider]  albuterol (VENTOLIN HFA) 108 (90 Base) MCG/ACT inhaler Inhale 1-2 puffs into the lungs every 6 (six) hours as needed for wheezing or shortness of breath. 02/10/20  Yes Cammie Sickle, MD  benzonatate (TESSALON) 200 MG capsule Take 200 mg by mouth 3 (three) times daily as needed for cough. Patient not taking: Reported on 11/09/2020   Yes [provider]  chlorpheniramine-HYDROcodone (TUSSIONEX) 10-8 MG/5ML SUER Take 5 mLs by mouth at bedtime as needed for cough. 06/07/20  Yes Jacquelin Hawking, NP  diazepam (VALIUM) 5 MG tablet Take 5 mg by mouth daily as needed (for vertigo).   Yes [provider]  ezetimibe (ZETIA) 10 MG tablet Take 10 mg by mouth daily.    Yes [provider]  Fluticasone-Salmeterol (ADVAIR) 250-50 MCG/DOSE AEPB Inhale 1 puff into the lungs 2 (two) times daily.   Yes [provider]  furosemide (LASIX) 20 MG tablet Take 20 mg by mouth daily as needed (fluid retention.).    Yes [provider]  hyoscyamine (LEVBID) 0.375 MG 12 hr tablet Take 0.375 mg by mouth daily as needed (abdominal cramping).  03/24/19  Yes [provider]  Ipratropium-Albuterol (COMBIVENT) 20-100 MCG/ACT  AERS respimat Inhale 1 puff into the lungs every 4 (four) hours as needed for wheezing.   Yes [provider]  LANTUS SOLOSTAR 100 UNIT/ML Solostar Pen Inject 12-20 Units into the skin 2 (two) times daily. 12U in AM, 20U in PM 10/25/19  Yes [provider]  Multiple Vitamin (MULTIVITAMIN WITH MINERALS) TABS tablet Take 1 tablet by mouth daily. One-A-Day for Women   Yes [provider]  nicotine polacrilex  (NICORETTE) 4 MG gum Take 4 mg by mouth in the morning, at noon, and at bedtime. 11/15/19  Yes [provider]  nystatin (MYCOSTATIN) 100000 UNIT/ML suspension Take 5 mLs by mouth 2 (two) times daily as needed (throat irritation.).   Yes [provider]  omeprazole (PRILOSEC) 20 MG capsule Take 20 mg by mouth daily before breakfast.    Yes [provider]  rosuvastatin (CRESTOR) 40 MG tablet Take 40 mg by mouth every evening.    Yes [provider]  vitamin B-12 (CYANOCOBALAMIN) 1000 MCG tablet Take 1,000 mcg by mouth daily.   Yes [provider]    Allergies as of 09/14/2020 - Review Complete 06/06/2020  Allergen Reaction Noted  . Penicillins Anaphylaxis and Rash 11/04/2017  . Metformin Other (See Comments) 10/18/2018  . Sulfa antibiotics Nausea And Vomiting 11/04/2017    Family History  Problem Relation Age of Onset  . Breast cancer Neg Hx     Social History   Socioeconomic History  . Marital status: Married    Spouse name: Not on file  . Number of children: Not on file  . Years of education: Not on file  . Highest education level: Not on file  Occupational History  . Not on file  Tobacco Use  . Smoking status: Former Smoker    Packs/day: 0.50    Years: 45.00    Pack years: 22.50    Types: Cigarettes    Quit date: 08/06/2020    Years since quitting: 0.2  . Smokeless tobacco: Never Used  Vaping Use  . Vaping Use: Never used  Substance and Sexual Activity  . Alcohol use: No  . Drug use: No  . Sexual activity: Not on file  Other Topics Concern  . 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 [place]     Social Determinants of Health   Financial Resource Strain: Not on file  Food Insecurity: Not on file  Transportation Needs: Not on file  Physical Activity: Not on file  Stress: Not on file  Social Connections: Not on file   Intimate Partner Violence: Not on file    Review of Systems: See HPI, otherwise negative ROS  Physical Exam: BP 123/63   Pulse 72   Temp (!) 96.7 F (35.9 C) (Temporal)   Resp 16   Ht 5' 1.5" (1.562 m)   Wt 62.1 kg   SpO2 98%   BMI 25.47 kg/m  General:   Alert,  pleasant and cooperative in NAD Head:  Normocephalic and atraumatic. Respiratory:  Normal work of breathing.  Impression/Plan: Donna Brennan is here for cataract surgery.  Risks, benefits, limitations, and alternatives regarding cataract surgery have been reviewed with the patient.  Questions have been answered.  All parties agreeable.   Benay Pillow, MD  11/19/2020, 7:14 AM

## 2020-11-19 NOTE — Anesthesia Postprocedure Evaluation (Signed)
Anesthesia Post Note  Patient: Donna Brennan  Procedure(s) Performed: CATARACT EXTRACTION PHACO AND INTRAOCULAR LENS PLACEMENT (IOC) RIGHT DIABETIC (Right Eye)     Patient location during evaluation: PACU Anesthesia Type: MAC Level of consciousness: awake and alert Pain management: pain level controlled Vital Signs Assessment: post-procedure vital signs reviewed and stable Respiratory status: spontaneous breathing Postop Assessment: no apparent nausea or vomiting Anesthetic complications: no   No complications documented.  Nandan Willems [employer]

## 2020-11-19 NOTE — Anesthesia Preprocedure Evaluation (Addendum)
Anesthesia Evaluation  Patient identified by MRN, date of birth, ID band Patient awake    Reviewed: Allergy & Precautions, H&P , NPO status , Patient's Chart, lab work & pertinent test results  Airway Mallampati: II  TM Distance: >3 FB Neck ROM: full    Dental  (+) Partial Lower, Partial Upper   Pulmonary COPD,  COPD inhaler, former smoker (45 pack years - quit 08/2020),   left lower lobe lung cancer/non-small cell   Pulmonary exam normal        Cardiovascular hypertension, + CAD  Normal cardiovascular exam     Neuro/Psych  Headaches, Anxiety    GI/Hepatic Neg liver ROS, GERD  ,  Endo/Other  diabetes, Type 2, Insulin Dependent  Renal/GU CRFRenal disease     Musculoskeletal negative musculoskeletal ROS (+)   Abdominal Normal abdominal exam  (+)   Peds  Hematology negative hematology ROS (+)   Anesthesia Other Findings   Reproductive/Obstetrics                            Anesthesia Physical  Anesthesia Plan  ASA: III  Anesthesia Plan: MAC   Post-op Pain Management:    Induction: Intravenous  PONV Risk Score and Plan: 2 and Treatment may vary due to age or medical condition, Midazolam and TIVA  Airway Management Planned: Natural Airway and Nasal Cannula  Additional Equipment: None  Intra-op Plan:   Post-operative Plan:   Informed Consent: I have reviewed the patients History and Physical, chart, labs and discussed the procedure including the risks, benefits and alternatives for the proposed anesthesia with the patient or authorized representative who has indicated his/her understanding and acceptance.     Dental Advisory Given  Plan Discussed with: CRNA  Anesthesia Plan Comments:         Anesthesia Quick Evaluation

## 2020-11-19 NOTE — Op Note (Signed)
OPERATIVE NOTE  Donna Brennan [id number] 11/19/2020   PREOPERATIVE DIAGNOSIS:  Nuclear sclerotic cataract right eye.  H25.11   POSTOPERATIVE DIAGNOSIS:    Nuclear sclerotic cataract right eye.     PROCEDURE:  Phacoemusification with posterior chamber intraocular lens placement of the right eye   LENS:   Implant Name Type Inv. Item Serial No. Manufacturer Lot No. LRB No. Used Action  LENS IOL TECNIS EYHANCE 23.0 - [id number] Intraocular Lens LENS IOL TECNIS EYHANCE 23.0 [id number] JOHNSON   Right 1 Implanted       Procedure(s) with comments: CATARACT EXTRACTION PHACO AND INTRAOCULAR LENS PLACEMENT (IOC) RIGHT DIABETIC (Right) - 16.54 1:40.0  DIB00 +23.0   ULTRASOUND TIME: 1 minutes 40 seconds.  CDE 16.54   SURGEON:  Benay Pillow, MD, MPH  ANESTHESIOLOGIST: Anesthesiologist: Ardeth Sportsman, MD CRNA: Jeannene Patella, CRNA   ANESTHESIA:  Topical with tetracaine drops augmented with 1% preservative-free intracameral lidocaine.  ESTIMATED BLOOD LOSS: less than 1 mL.   COMPLICATIONS:  None.   DESCRIPTION OF PROCEDURE:  The patient was identified in the holding room and transported to the operating room and placed in the supine position under the operating microscope.  The right eye was identified as the operative eye and it was prepped and draped in the usual sterile ophthalmic fashion.   A 1.0 millimeter clear-corneal paracentesis was made at the 10:30 position. 0.5 ml of preservative-free 1% lidocaine with epinephrine was injected into the anterior chamber.  The anterior chamber was filled with Healon 5 viscoelastic.  A 2.4 millimeter keratome was used to make a near-clear corneal incision at the 8:00 position.  A curvilinear capsulorrhexis was made with a cystotome and capsulorrhexis forceps.  Balanced salt solution was used to hydrodissect and hydrodelineate the nucleus.   Phacoemulsification was then used in stop and chop fashion to remove the lens nucleus and epinucleus.   The remaining cortex was then removed using the irrigation and aspiration handpiece. Healon was then placed into the capsular bag to distend it for lens placement.  A lens was then injected into the capsular bag.  The remaining viscoelastic was aspirated.   Wounds were hydrated with balanced salt solution.  The anterior chamber was inflated to a physiologic pressure with balanced salt solution.   Intracameral vigamox 0.1 mL undiluted was injected into the eye and a drop placed onto the ocular surface.  No wound leaks were noted.  The patient was taken to the recovery room in stable condition without complications of anesthesia or surgery  Benay Pillow 11/19/2020, 7:59 AM

## 2020-11-20 ENCOUNTER — Encounter: Payer: Self-pay | Admitting: Ophthalmology

## 2020-11-21 ENCOUNTER — Other Ambulatory Visit: Payer: Self-pay

## 2020-11-21 ENCOUNTER — Ambulatory Visit
Admission: RE | Admit: 2020-11-21 | Discharge: 2020-11-21 | Disposition: A | Discharge status: Home / Self Care | Payer: Medicare Other | Source: Ambulatory Visit | Attending: Internal Medicine | Admitting: Internal Medicine

## 2020-11-21 DIAGNOSIS — Z9221 Personal history of antineoplastic chemotherapy: Secondary | ICD-10-CM | POA: Insufficient documentation

## 2020-11-21 DIAGNOSIS — Z923 Personal history of irradiation: Secondary | ICD-10-CM | POA: Insufficient documentation

## 2020-11-21 DIAGNOSIS — I2510 Atherosclerotic heart disease of native coronary artery without angina pectoris: Secondary | ICD-10-CM | POA: Diagnosis not present

## 2020-11-21 DIAGNOSIS — C3432 Malignant neoplasm of lower lobe, left bronchus or lung: Secondary | ICD-10-CM | POA: Diagnosis not present

## 2020-11-21 DIAGNOSIS — C787 Secondary malignant neoplasm of liver and intrahepatic bile duct: Secondary | ICD-10-CM | POA: Diagnosis not present

## 2020-11-21 DIAGNOSIS — I700 Atherosclerosis of aorta: Secondary | ICD-10-CM | POA: Insufficient documentation

## 2020-11-21 LAB — GLUCOSE, CAPILLARY: Glucose-Capillary: 106 mg/dL — ABNORMAL HIGH (ref 70–99)

## 2020-11-21 MED ORDER — FLUDEOXYGLUCOSE F - 18 (FDG) INJECTION
7.1000 | Freq: Once | INTRAVENOUS | Status: AC | PRN
  Administered 2020-11-21: 7.022 via INTRAVENOUS

## 2020-11-23 ENCOUNTER — Telehealth: Payer: Self-pay | Admitting: Internal Medicine

## 2020-11-23 ENCOUNTER — Encounter: Payer: Self-pay | Admitting: *Deleted

## 2020-11-23 DIAGNOSIS — K769 Liver disease, unspecified: Secondary | ICD-10-CM

## 2020-11-23 DIAGNOSIS — C3432 Malignant neoplasm of lower lobe, left bronchus or lung: Secondary | ICD-10-CM

## 2020-11-23 NOTE — Telephone Encounter (Signed)
Donna Brennan- I will schedule her liver biopsy and let you know so you can schedule her follow up with Dr. Jacinto Reap.

## 2020-11-23 NOTE — Telephone Encounter (Signed)
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  In agreement with the liver biopsy.  Discussed with Dr. Annamaria Boots, IR.  C-please schedule liver biopsy ASAP.  Follow-up with-MD 4 to 5 days post biopsy.   Haley-please reach out to the daughter-to reiterate the above plan/follow-up.  GB

## 2020-11-26 NOTE — Telephone Encounter (Signed)
Appts have been scheduled as requested. Pt made aware via Mychart.

## 2020-11-27 ENCOUNTER — Other Ambulatory Visit: Payer: Self-pay | Admitting: Radiology

## 2020-11-28 ENCOUNTER — Other Ambulatory Visit: Payer: Self-pay

## 2020-11-28 ENCOUNTER — Ambulatory Visit
Admission: RE | Admit: 2020-11-28 | Discharge: 2020-11-28 | Disposition: A | Discharge status: Home / Self Care | Payer: Medicare Other | Source: Ambulatory Visit | Attending: Internal Medicine | Admitting: Internal Medicine

## 2020-11-28 DIAGNOSIS — Z87891 Personal history of nicotine dependence: Secondary | ICD-10-CM | POA: Diagnosis not present

## 2020-11-28 DIAGNOSIS — C3432 Malignant neoplasm of lower lobe, left bronchus or lung: Secondary | ICD-10-CM | POA: Diagnosis present

## 2020-11-28 DIAGNOSIS — K769 Liver disease, unspecified: Secondary | ICD-10-CM | POA: Diagnosis not present

## 2020-11-28 LAB — CBC
HCT: 36 % (ref 36.0–46.0)
Hemoglobin: 12.1 g/dL (ref 12.0–15.0)
MCH: 30.6 pg (ref 26.0–34.0)
MCHC: 33.6 g/dL (ref 30.0–36.0)
MCV: 91.1 fL (ref 80.0–100.0)
Platelets: 310 10*3/uL (ref 150–400)
RBC: 3.95 MIL/uL (ref 3.87–5.11)
RDW: 13 % (ref 11.5–15.5)
WBC: 7.6 10*3/uL (ref 4.0–10.5)
nRBC: 0 % (ref 0.0–0.2)

## 2020-11-28 LAB — GLUCOSE, CAPILLARY: Glucose-Capillary: 153 mg/dL — ABNORMAL HIGH (ref 70–99)

## 2020-11-28 LAB — PROTIME-INR
INR: 1 (ref 0.8–1.2)
Prothrombin Time: 13 seconds (ref 11.4–15.2)

## 2020-11-28 MED ORDER — MIDAZOLAM HCL 2 MG/2ML IJ SOLN
INTRAMUSCULAR | Status: AC | PRN
  Administered 2020-11-28 (×2): 1 mg via INTRAVENOUS

## 2020-11-28 MED ORDER — FENTANYL CITRATE (PF) 100 MCG/2ML IJ SOLN
INTRAMUSCULAR | Status: AC
  Filled 2020-11-28: qty 2

## 2020-11-28 MED ORDER — SODIUM CHLORIDE 0.9 % IV SOLN: INTRAVENOUS | Status: DC

## 2020-11-28 MED ORDER — FENTANYL CITRATE (PF) 100 MCG/2ML IJ SOLN
INTRAMUSCULAR | Status: AC | PRN
  Administered 2020-11-28 (×2): 50 ug via INTRAVENOUS

## 2020-11-28 MED ORDER — MIDAZOLAM HCL 2 MG/2ML IJ SOLN
INTRAMUSCULAR | Status: AC
  Filled 2020-11-28: qty 2

## 2020-11-28 NOTE — Discharge Instructions (Signed)
Liver Biopsy, Care After These instructions give you information about how to care for yourself after your procedure. Your health care provider may also give you more specific instructions. If you have problems or questions, contact your health care provider. What can I expect after the procedure? After your procedure, it is common to have:  Pain and soreness in the area where the biopsy was done.  Bruising around the area where the biopsy was done.  Sleepiness and fatigue for 1-2 days. Follow these instructions at home: Medicines  Take over-the-counter and prescription medicines only as told by your health care provider.  If you were prescribed an antibiotic medicine, take it as told by your health care provider. Do not stop taking the antibiotic even if you start to feel better.  Do not take medicines such as aspirin and ibuprofen unless your health care provider tells you to take them. These medicines thin your blood and can increase the risk of bleeding.  If you are taking prescription pain medicine, take actions to prevent or treat constipation. Your health care provider may recommend that you: ? Drink enough fluid to keep your urine pale yellow. ? Eat foods that are high in fiber, such as fresh fruits and vegetables, whole grains, and beans. ? Limit foods that are high in fat and processed sugars, such as fried or sweet foods. ? Take an over-the-counter or prescription medicine for constipation. Incision care  Follow instructions from your health care provider about how to take care of your incision. Make sure you: ? Wash your hands with soap and water before you change your bandage (dressing). If soap and water are not available, use hand sanitizer. ? Change your dressing as told by your health care provider. ? Leave stitches (sutures), skin glue, or adhesive strips in place. These skin closures may need to stay in place for 2 weeks or longer. If adhesive strip edges start to  loosen and curl up, you may trim the loose edges. Do not remove adhesive strips completely unless your health care provider tells you to do that.  Check your incision area every day for signs of infection. Check for: ? Redness, swelling, or pain. ? Fluid or blood. ? Warmth. ? Pus or a bad smell.  Do not take baths, swim, or use a hot tub until your health care provider says it is okay to do so. Activity  Rest at home for 1-2 days, or as directed by your health care provider. ? Avoid sitting for a long time without moving. Get up to take short walks every 1-2 hours. This is important to improve blood flow and breathing. Ask for help if you feel weak or unsteady.  Return to your normal activities as told by your health care provider. Ask your health care provider what activities are safe for you.  Do not drive or use heavy machinery while taking prescription pain medicine.  Do not lift anything that is heavier than 10 lb (4.5 kg), or the limit that your health care provider tells you, until he or she says that it is safe.  Do not play contact sports for 2 weeks after the procedure.   General instructions  Do not drink alcohol in the first week after the procedure.  Have someone stay with you for at least 24 hours after the procedure.  It is your responsibility to obtain your test results. Ask your health care provider, or the department that is doing the test: ? When will my  results be ready? ? How will I get my results? ? What are my treatment options? ? What other tests do I need? ? What are my next steps?  Keep all follow-up visits as told by your health care provider. This is important.   Contact a health care provider if:  You have increased bleeding from an incision, resulting in more than a small spot of blood.  You have redness, swelling, or increasing pain in any incisions.  You notice a discharge or a bad smell coming from any of your incisions.  You have a fever or  chills. Get help right away if:  You develop swelling, bloating, or pain in your abdomen.  You become dizzy or faint.  You develop a rash.  You have nausea or you vomit.  You faint, or you have shortness of breath or difficulty breathing.  You develop chest pain.  You have problems with your speech or vision.  You have trouble with your balance or moving your arms or legs. Summary  After the liver biopsy, it is common to have pain, soreness, and bruising in the area, as well as sleepiness and fatigue.  Take over-the-counter and prescription medicines only as told by your health care provider.  Follow instructions from your health care provider about how to care for your incision. Check the incision area daily for signs of infection. This information is not intended to replace advice given to you by your health care provider. Make sure you discuss any questions you have with your health care provider. Document Revised: 11/15/2018 Document Reviewed: 10/02/2017 Elsevier Patient Education  2021 [employer].

## 2020-11-28 NOTE — Progress Notes (Signed)
Patient clinically stable post Liver Biopsy per Dr Earleen Newport, tolerated well. Denies complaints at this time. Vitals stable pre and post procedure. Report given to [facility] post op in specials. Received Versed  2 mg along with Fentanyl 100 mcg IV for procedure.

## 2020-11-28 NOTE — H&P (Signed)
Chief Complaint: Liver mass  Referring Physician(s): Cammie Sickle  Supervising Physician: Corrie Mckusick  Patient Status: [facility] - Out-pt  History of Present Illness: Donna Brennan is a 75 y.o. female presenting for a liver mass biopsy.    She has imaging diagnosis of liver mass, with history of lung CA.   Dr. Rogue Bussing has referred her today for biopsy.  Notes: Clinically stage I non-small cell lung cancer-biopsy-dysplastic cells suspicious of malignancy s/but no invasion noted available limited sample. S/p SBRT [April 2021]. 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   Donna Brennan denies any new symptoms.   Past Medical History:  Diagnosis Date  . CKD (chronic kidney disease), stage IV ([place])   . Colon polyps   . COPD (chronic obstructive pulmonary disease) ([place])   . Diabetes mellitus without complication ([place])   . GERD (gastroesophageal reflux disease)   . Headache   . Hepatitis    when in 4th or 5th grade.  Resolved  . Hyperlipidemia   . IBS (irritable bowel syndrome)   . Peptic ulcer   . Tobacco abuse   . Vertigo    No "bad" episodes in over 1 year  . Wears dentures    partial ujpper and lower    Past Surgical History:  Procedure Laterality Date  . ABDOMINAL HYSTERECTOMY    . carbuncle removal     urethra  . CATARACT EXTRACTION W/PHACO Left 10/29/2020   Procedure: CATARACT EXTRACTION PHACO AND INTRAOCULAR LENS PLACEMENT (IOC) LEFT DIABETIC 1051 01:00.0 ;  Surgeon: Eulogio Bear, MD;  Location: [place];  Service: Ophthalmology;  Laterality: Left;  Diabetic - insulin  . CATARACT EXTRACTION W/PHACO Right 11/19/2020   Procedure: CATARACT EXTRACTION PHACO AND INTRAOCULAR LENS PLACEMENT ([place]) RIGHT DIABETIC;  Surgeon: Eulogio Bear, MD;  Location: [place];  Service: Ophthalmology;  Laterality: Right;  16.54 1:40.0  . COLONOSCOPY    . COLONOSCOPY WITH PROPOFOL N/A 11/05/2017   Procedure: COLONOSCOPY WITH PROPOFOL;  Surgeon: Lollie Sails, MD;  Location[facility] ENDOSCOPY;  Service: Endoscopy;  Laterality: N/A;  . ESOPHAGOGASTRODUODENOSCOPY    . ESOPHAGOGASTRODUODENOSCOPY (EGD) WITH PROPOFOL N/A 11/05/2017   Procedure: ESOPHAGOGASTRODUODENOSCOPY (EGD) WITH PROPOFOL;  Surgeon: Lollie Sails, MD;  Location: [facility] ENDOSCOPY;  Service: Endoscopy;  Laterality: N/A;  . VIDEO BRONCHOSCOPY WITH ENDOBRONCHIAL NAVIGATION N/A 12/02/2019   Procedure: VIDEO BRONCHOSCOPY WITH ENDOBRONCHIAL NAVIGATION;  Surgeon: Ottie Glazier, MD;  Location: [facility] ORS;  Service: Thoracic;  Laterality: N/A;  . VIDEO BRONCHOSCOPY WITH ENDOBRONCHIAL ULTRASOUND N/A 12/02/2019   Procedure: VIDEO BRONCHOSCOPY WITH ENDOBRONCHIAL ULTRASOUND;  Surgeon: Ottie Glazier, MD;  Location: [facility] ORS;  Service: Thoracic;  Laterality: N/A;    Allergies: Penicillins, Metformin, and Sulfa antibiotics  Medications: Prior to Admission medications   Medication Sig Start Date End Date Taking? Authorizing Provider  acetaminophen (TYLENOL) 500 MG tablet Take 1,000 mg by mouth every 6 (six) hours as needed (for pain.).    Yes [provider]  albuterol (VENTOLIN HFA) 108 (90 Base) MCG/ACT inhaler Inhale 1-2 puffs into the lungs every 6 (six) hours as needed for wheezing or shortness of breath. 02/10/20  Yes Cammie Sickle, MD  benzonatate (TESSALON) 200 MG capsule Take 200 mg by mouth 3 (three) times daily as needed for cough.   Yes [provider]  chlorpheniramine-HYDROcodone (TUSSIONEX) 10-8 MG/5ML SUER Take 5 mLs  by mouth at bedtime as needed for cough. 06/07/20  Yes Jacquelin Hawking, NP  diazepam (VALIUM) 5 MG tablet Take 5 mg by mouth daily as needed (for vertigo).   Yes [provider]  ezetimibe (ZETIA) 10 MG tablet Take 10 mg by mouth daily.    Yes  [provider]  Fluticasone-Salmeterol (ADVAIR) 250-50 MCG/DOSE AEPB Inhale 1 puff into the lungs 2 (two) times daily.   Yes [provider]  hyoscyamine (LEVBID) 0.375 MG 12 hr tablet Take 0.375 mg by mouth daily as needed (abdominal cramping).  03/24/19  Yes [provider]  Ipratropium-Albuterol (COMBIVENT) 20-100 MCG/ACT AERS respimat Inhale 1 puff into the lungs every 4 (four) hours as needed for wheezing.   Yes [provider]  LANTUS SOLOSTAR 100 UNIT/ML Solostar Pen Inject 12-20 Units into the skin 2 (two) times daily. 12U in AM, 20U in PM 10/25/19  Yes [provider]  Multiple Vitamin (MULTIVITAMIN WITH MINERALS) TABS tablet Take 1 tablet by mouth daily. One-A-Day for Women   Yes [provider]  nystatin (MYCOSTATIN) 100000 UNIT/ML suspension Take 5 mLs by mouth 2 (two) times daily as needed (throat irritation.).   Yes [provider]  omeprazole (PRILOSEC) 20 MG capsule Take 20 mg by mouth daily before breakfast.    Yes [provider]  rosuvastatin (CRESTOR) 40 MG tablet Take 40 mg by mouth every evening.    Yes [provider]  vitamin B-12 (CYANOCOBALAMIN) 1000 MCG tablet Take 1,000 mcg by mouth daily.   Yes [provider]  furosemide (LASIX) 20 MG tablet Take 20 mg by mouth daily as needed (fluid retention.).  Patient not taking: Reported on 11/28/2020    [provider]  nicotine polacrilex (NICORETTE) 4 MG gum Take 4 mg by mouth in the morning, at noon, and at bedtime. Patient not taking: Reported on 11/28/2020 11/15/19   [provider]     Family History  Problem Relation Age of Onset  . Breast cancer Neg Hx     Social History   Socioeconomic History  . Marital status: Married    Spouse name: Not on file  . Number of children: Not on file  . Years of education: Not on file  . Highest education level: Not on file  Occupational History  . Not on file  Tobacco Use   . Smoking status: Former Smoker    Packs/day: 0.50    Years: 45.00    Pack years: 22.50    Types: Cigarettes    Quit date: 08/06/2020    Years since quitting: 0.3  . Smokeless tobacco: Never Used  Vaping Use  . Vaping Use: Never used  Substance and Sexual Activity  . Alcohol use: No  . Drug use: No  . Sexual activity: Not on file  Other Topics Concern  . 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 [place]     Social Determinants of Health   Financial Resource Strain: Not on file  Food Insecurity: Not on file  Transportation Needs: Not on file  Physical Activity: Not on file  Stress: Not on file  Social Connections: Not on file    ECOG Status: 0 - Asymptomatic  Review of Systems: A 12 point ROS discussed and pertinent positives are indicated in the HPI above.  All other systems are negative.  Review of Systems  Vital Signs: BP [place] Kitchen)  93/59 (BP Location: Left Arm)   Pulse 75   Temp 98.1 F (36.7 C) (Oral)   Resp 13   Ht 5' 1.5" (1.562 m)   SpO2 95%   BMI 25.47 kg/m   Physical Exam General: 75 yo female appearing stated age.  Well-developed, well-nourished.  No distress. HEENT: Atraumatic, normocephalic.  Conjugate gaze, extra-ocular motor intact. No scleral icterus or scleral injection. No lesions on external ears, nose, lips, or gums.  Oral mucosa moist, pink.  Neck: Symmetric with no goiter enlargement.  Chest/Lungs:  Symmetric chest with inspiration/expiration.  No labored breathing.  Clear to auscultation with no wheezes, rhonchi, or rales.  Heart:  RRR, with no third heart sounds appreciated. No JVD appreciated.  Abdomen:  Soft, NT/ND, with + bowel sounds.   Genito-urinary: Deferred Neurologic: Alert & Oriented to person, place, and time.   Normal affect and insight.  Appropriate questions.  Moving all 4 extremities with gross sensory intact.         Imaging: CT Chest Wo Contrast  Result Date: 11/06/2020 CLINICAL DATA:  Lung cancer, assess treatment response. Radiation completed April 2021. Persistent shortness of breath. EXAM: CT CHEST WITHOUT CONTRAST TECHNIQUE: Multidetector CT imaging of the chest was performed following the standard protocol without IV contrast. COMPARISON:  05/02/2020. FINDINGS: Cardiovascular: Atherosclerotic calcification of the aorta, aortic valve and coronary arteries. Heart size normal. No pericardial effusion. Mediastinum/Nodes: Low internal jugular and high left paratracheal lymph nodes are subcentimeter in size but have increased in size from 05/02/2020. Index low left internal jugular lymph node measures 6 mm (2/14), compared to 3 mm. High left paratracheal lymph node measures 7 mm (2/20), compared to 3 mm. Additional mediastinal lymph nodes have also increased in size in the interval. Index low right paratracheal lymph node measures 11 mm (2/57), previously 4 mm. Hilar regions are difficult to evaluate without IV contrast. No axillary adenopathy. Esophagus is grossly unremarkable. Lungs/Pleura: Post radiation bronchiectasis, scarring and volume loss in the left upper lobe. Small nodules in the superior segment left lower lobe (3/74 and 78) are unchanged. However, there is new septal thickening and peribronchovascular nodularity more inferiorly within the left lower lobe. Trace pleural fluid or pleural thickening on the left. Airway is unremarkable. Upper Abdomen: There is a new vague rounded area decreased attenuation in the peripheral right hepatic lobe, measuring 3.2 x 3.5 cm (2/139). Well-circumscribed low-attenuation lesions in the left hepatic lobe measure up to 3.7 cm, previously characterized as cysts. Visualized portions of the liver, gallbladder, adrenal glands, kidneys, spleen, pancreas, stomach and bowel are otherwise unremarkable with the exception of a tiny hiatal hernia. Periportal lymph nodes are not enlarged  by CT size criteria. Musculoskeletal: No worrisome lytic or sclerotic lesions. IMPRESSION: 1. 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 2. Aortic atherosclerosis (ICD10-I70.0). Coronary artery calcification. Electronically Signed   By: Lorin Picket M.D.   On: 11/06/2020 11:57   NM PET Image Restag (PS) Skull Base To Thigh  Result Date: 11/22/2020 CLINICAL DATA:  Subsequent treatment strategy for non-small cell left lower lobe lung cancer detected in 2020 status post chemotherapy and radiation therapy, with findings worrisome for progression of metastatic disease on recent chest CT. EXAM: NUCLEAR MEDICINE PET SKULL BASE TO THIGH TECHNIQUE: 7.0 mCi F-18 FDG was injected intravenously. Full-ring PET imaging was performed from the skull base to thigh after the radiotracer. CT data was obtained  and used for attenuation correction and anatomic localization. Fasting blood glucose: 106 mg/dl COMPARISON:  11/06/2020 chest CT.  09/27/2019 PET-CT. FINDINGS: Mediastinal blood pool activity: SUV max 2.2 Liver activity: SUV max NA NECK: New hypermetabolic 0.8 cm low right level 4 neck lymph node with max SUV 3.8 (series 3/image 51) at the thoracic inlet. New hypermetabolic 0.8 cm low left level 4 neck lymph node with max SUV 5.1 (series 3/image 53) at the thoracic inlet. Incidental CT findings: none CHEST: New enlarged hypermetabolic right paratracheal lymph nodes, largest 1.3 cm with max SUV 6.6 (series 3/image 74). New enlarged hypermetabolic 1.1 cm high left mediastinal lymph node in the tracheoesophageal groove with max SUV 6.5 (series 3/image 56). No enlarged or hypermetabolic axillary or hilar lymph nodes. Indistinct low level activity associated with nodular septal thickening in the basilar left lower lobe with max SUV 2.3 (series 3/image 110). No residual  hypermetabolism at the site of the treated 1.0 cm central left lower lobe pulmonary nodule with max SUV 1.2 (series 3/image 89). Bandlike lingular consolidation with associated mild volume loss and distortion, compatible with evolving radiation change. Incidental CT findings: Coronary atherosclerosis. Atherosclerotic nonaneurysmal thoracic aorta. ABDOMEN/PELVIS: New hypermetabolic 4.2 x 4.0 cm peripheral right liver hypodense mass with max SUV 12.5 (series 3/image 120). No additional hypermetabolic liver lesions. No abnormal hypermetabolic activity within the pancreas, adrenal glands, or spleen. No hypermetabolic lymph nodes in the abdomen or pelvis. Incidental CT findings: Simple left liver cysts, largest 3.7 cm. Atherosclerotic nonaneurysmal abdominal aorta. Hysterectomy. SKELETON: No focal hypermetabolic activity to suggest skeletal metastasis. Incidental CT findings: none IMPRESSION: 1. New hypermetabolic nodal metastases in the bilateral lower neck/thoracic inlet and bilateral mediastinum. 2. Indistinct low level activity associated with new nodular septal thickening in the basilar left lower lung lobe, compatible with lymphangitic carcinomatosis. 3. New solitary hypermetabolic 4.2 cm peripheral right liver metastasis. 4. No residual hypermetabolism at the site of the treated 1.0 cm central left lower lobe pulmonary nodule. Evolving radiation changes in the lingula. 5. Chronic findings include: Aortic Atherosclerosis (ICD10-I70.0). Coronary atherosclerosis. Electronically Signed   By: Ilona Sorrel M.D.   On: 11/22/2020 11:20    Labs:  CBC: Recent Labs    11/09/20 1009 11/28/20 1008  WBC 6.5 7.6  HGB 12.9 12.1  HCT 37.9 36.0  PLT 287 310    COAGS: Recent Labs    11/28/20 1008  INR 1.0    BMP: Recent Labs    05/02/20 1303 11/09/20 1009  NA  --  136  K  --  4.2  CL  --  104  CO2  --  23  GLUCOSE  --  230*  BUN  --  13  CALCIUM  --  8.4*  CREATININE 1.10* 1.10*  GFRNONAA  --  52*     LIVER FUNCTION TESTS: Recent Labs    11/09/20 1009  BILITOT 0.4  AST 18  ALT 16  ALKPHOS 76  PROT 6.4*  ALBUMIN 2.6*    TUMOR MARKERS: No results for input(s): AFPTM, CEA, CA199, CHROMGRNA in the last 8760 hours.  Assessment and Plan:  75 yo female presents for US guided liver mass biopsy.   Risks and benefits of US guided liver mass biopsy was discussed with the patient and/or patient's family including, but not limited to bleeding, infection, damage to adjacent structures or low yield requiring additional tests.  All of the questions were answered and there is agreement to proceed.  Consent signed and in chart.  Thank you for this interesting consult.  I greatly enjoyed meeting Donna Brennan and look forward to participating in their care.  A copy of this report was sent to the requesting provider on this date.  Electronically Signed: Corrie Mckusick, DO 11/28/2020, 11:09 AM   I spent a total of  30 Minutes   in face to face in clinical consultation, greater than 50% of which was counseling/coordinating care for liver mass, possible image guided liver mass biopsy

## 2020-11-28 NOTE — Procedures (Signed)
Interventional Radiology Procedure Note  Procedure: US guided biopsy of liver mass, right liver.  Mx 18g core biopsy Complications: None EBL: None Recommendations: - Bedrest 2 hours.   - Routine wound care - Follow up pathology - Advance diet   Signed,  Corrie Mckusick, DO bx

## 2020-12-03 ENCOUNTER — Other Ambulatory Visit: Payer: Self-pay | Admitting: Pathology

## 2020-12-04 ENCOUNTER — Inpatient Hospital Stay: Payer: Medicare Other | Attending: Internal Medicine | Admitting: Internal Medicine

## 2020-12-04 ENCOUNTER — Telehealth: Payer: Self-pay | Admitting: *Deleted

## 2020-12-04 ENCOUNTER — Other Ambulatory Visit: Payer: Self-pay

## 2020-12-04 ENCOUNTER — Encounter: Payer: Self-pay | Admitting: Internal Medicine

## 2020-12-04 DIAGNOSIS — F17210 Nicotine dependence, cigarettes, uncomplicated: Secondary | ICD-10-CM | POA: Insufficient documentation

## 2020-12-04 DIAGNOSIS — Z923 Personal history of irradiation: Secondary | ICD-10-CM | POA: Diagnosis not present

## 2020-12-04 DIAGNOSIS — R978 Other abnormal tumor markers: Secondary | ICD-10-CM | POA: Insufficient documentation

## 2020-12-04 DIAGNOSIS — Z85118 Personal history of other malignant neoplasm of bronchus and lung: Secondary | ICD-10-CM | POA: Insufficient documentation

## 2020-12-04 DIAGNOSIS — Z7189 Other specified counseling: Secondary | ICD-10-CM | POA: Diagnosis not present

## 2020-12-04 DIAGNOSIS — C787 Secondary malignant neoplasm of liver and intrahepatic bile duct: Secondary | ICD-10-CM | POA: Insufficient documentation

## 2020-12-04 DIAGNOSIS — R970 Elevated carcinoembryonic antigen [CEA]: Secondary | ICD-10-CM | POA: Insufficient documentation

## 2020-12-04 DIAGNOSIS — Z5111 Encounter for antineoplastic chemotherapy: Secondary | ICD-10-CM | POA: Diagnosis present

## 2020-12-04 DIAGNOSIS — C3432 Malignant neoplasm of lower lobe, left bronchus or lung: Secondary | ICD-10-CM | POA: Diagnosis not present

## 2020-12-04 DIAGNOSIS — C801 Malignant (primary) neoplasm, unspecified: Secondary | ICD-10-CM | POA: Insufficient documentation

## 2020-12-04 DIAGNOSIS — J449 Chronic obstructive pulmonary disease, unspecified: Secondary | ICD-10-CM | POA: Diagnosis not present

## 2020-12-04 LAB — SURGICAL PATHOLOGY

## 2020-12-04 MED ORDER — PROCHLORPERAZINE MALEATE 10 MG PO TABS: 10.0000 mg | ORAL_TABLET | Freq: Four times a day (QID) | ORAL | 3 refills | Status: DC | PRN

## 2020-12-04 MED ORDER — ONDANSETRON HCL 8 MG PO TABS: 8.0000 mg | ORAL_TABLET | Freq: Three times a day (TID) | ORAL | 3 refills | Status: DC | PRN

## 2020-12-04 MED ORDER — LIDOCAINE-PRILOCAINE 2.5-2.5 % EX CREA: 1.0000 "application " | TOPICAL_CREAM | CUTANEOUS | 3 refills | Status: DC | PRN

## 2020-12-04 MED ORDER — DEXAMETHASONE 4 MG PO TABS: ORAL_TABLET | ORAL | 0 refills | Status: DC

## 2020-12-04 NOTE — Telephone Encounter (Signed)
Port a cath placement scheduled for 12/13/20. Arrival time at 8:30 am. Patient aware that she needs to bring a driver to this apt. She was instructed not to eat/drink anything 6-8 hours prior to the port placement. Teach back process performed.  Will go ahead and send her emla cream to the pharmacy as well as her compazine/zofran to have on hand prior to her chemotherapy treatments. She thanked me for providing her with the above information.

## 2020-12-04 NOTE — Progress Notes (Signed)
[place] NOTE  Patient Care Team: Adin Hector, MD as PCP - General (Internal Medicine) Telford Nab, RN as Oncology Nurse Navigator Erby Pian, MD as Referring Physician (Specialist) Cammie Sickle, MD as Consulting Physician (Internal Medicine) Noreene Filbert, MD as Referring Physician (Radiation Oncology)  CHIEF COMPLAINTS/PURPOSE OF CONSULTATION: Lung cancer  #  Oncology History Overview Note  #FEB 2021- LUNG, LEFT LOWER LOBE; ENB BRONCHOALVEOLAR LAVAGE:  - SUSPICIOUS FOR MALIGNANCY. 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[Dr.Aleskerov/Dr.Fleming]; STAGE I; [place];  Proceed with SBRT [finsihed 01/09/2020]   Primary cancer of left lower lobe of lung ([place])  12/21/2019 Initial Diagnosis   Primary cancer of left lower lobe of lung ([place])   12/04/2020 -  Chemotherapy    Patient is on Treatment Plan: LUNG NSCLC PEMETREXED (ALIMTA) / CARBOPLATIN Q21D X 1 CYCLES      12/04/2020 Cancer Staging   Staging form: Lung, AJCC 8th Edition - Clinical: Stage IVA (cTX, cN2, pM1b) - Signed by Cammie Sickle, MD on 12/04/2020 Histopathologic type: Adenocarcinoma, NOS     HISTORY OF PRESENTING ILLNESS:  Donna Brennan 75 y.o.  female history of smoking left lower lobe lung cancer/non-small cell s/p SBRT is here for follow-up; review results of the PET scan/liver biopsy.  Liver biopsy uneventful.  Continues to complain of mild pain at the site of biopsy or improving.  Chronic shortness of breath mild cough but no hemoptysis.  No headaches.   Review of Systems  Constitutional: Positive for malaise/fatigue. Negative for chills, diaphoresis, fever and weight loss.  HENT: Negative for nosebleeds and sore throat.   Eyes: Negative for double vision.  Respiratory: Positive for cough and shortness of breath. Negative for hemoptysis, sputum production and wheezing.   Cardiovascular: Negative for chest pain,  palpitations, orthopnea and leg swelling.  Gastrointestinal: Negative for abdominal pain, blood in stool, constipation, diarrhea, heartburn, melena, nausea and vomiting.  Genitourinary: Negative for dysuria, frequency and urgency.  Musculoskeletal: Positive for back pain and joint pain.  Skin: Negative.  Negative for itching and rash.  Neurological: Negative for dizziness, tingling, focal weakness, weakness and headaches.  Endo/Heme/Allergies: Does not bruise/bleed easily.  Psychiatric/Behavioral: Negative for depression. The patient is not nervous/anxious and does not have insomnia.      MEDICAL HISTORY:  Past Medical History:  Diagnosis Date  . CKD (chronic kidney disease), stage IV ([place])   . Colon polyps   . COPD (chronic obstructive pulmonary disease) ([place])   . Diabetes mellitus without complication ([place])   . GERD (gastroesophageal reflux disease)   . Headache   . Hepatitis    when in 4th or 5th grade.  Resolved  . Hyperlipidemia   . IBS (irritable bowel syndrome)   . Peptic ulcer   . Tobacco abuse   . Vertigo    No "bad" episodes in over 1 year  . Wears dentures    partial ujpper and lower    SURGICAL HISTORY: Past Surgical History:  Procedure Laterality Date  . ABDOMINAL HYSTERECTOMY    . carbuncle removal     urethra  . CATARACT EXTRACTION W/PHACO Left 10/29/2020   Procedure: CATARACT EXTRACTION PHACO AND INTRAOCULAR LENS PLACEMENT (IOC) LEFT DIABETIC 1051 01:00.0 ;  Surgeon: Eulogio Bear, MD;  Location: [facility];  Service: Ophthalmology;  Laterality: Left;  Diabetic - insulin  . CATARACT EXTRACTION W/PHACO Right 11/19/2020   Procedure: CATARACT EXTRACTION PHACO AND INTRAOCULAR LENS PLACEMENT (IOC) RIGHT DIABETIC;  Surgeon: Eulogio Bear, MD;  Location: [facility];  Service: Ophthalmology;  Laterality: Right;  16.54 1:40.0  . COLONOSCOPY    . COLONOSCOPY WITH PROPOFOL N/A 11/05/2017   Procedure: COLONOSCOPY WITH PROPOFOL;  Surgeon:  Lollie Sails, MD;  Location: [facility] ENDOSCOPY;  Service: Endoscopy;  Laterality: N/A;  . ESOPHAGOGASTRODUODENOSCOPY    . ESOPHAGOGASTRODUODENOSCOPY (EGD) WITH PROPOFOL N/A 11/05/2017   Procedure: ESOPHAGOGASTRODUODENOSCOPY (EGD) WITH PROPOFOL;  Surgeon: Lollie Sails, MD;  Location: [facility] ENDOSCOPY;  Service: Endoscopy;  Laterality: N/A;  . VIDEO BRONCHOSCOPY WITH ENDOBRONCHIAL NAVIGATION N/A 12/02/2019   Procedure: VIDEO BRONCHOSCOPY WITH ENDOBRONCHIAL NAVIGATION;  Surgeon: Ottie Glazier, MD;  Location: [facility] ORS;  Service: Thoracic;  Laterality: N/A;  . VIDEO BRONCHOSCOPY WITH ENDOBRONCHIAL ULTRASOUND N/A 12/02/2019   Procedure: VIDEO BRONCHOSCOPY WITH ENDOBRONCHIAL ULTRASOUND;  Surgeon: Ottie Glazier, MD;  Location: [facility] ORS;  Service: Thoracic;  Laterality: N/A;    SOCIAL HISTORY: Social History   Socioeconomic History  . Marital status: Married    Spouse name: Not on file  . Number of children: Not on file  . Years of education: Not on file  . Highest education level: Not on file  Occupational History  . Not on file  Tobacco Use  . Smoking status: Former Smoker    Packs/day: 0.50    Years: 45.00    Pack years: 22.50    Types: Cigarettes    Quit date: 08/06/2020    Years since quitting: 0.3  . Smokeless tobacco: Never Used  Vaping Use  . Vaping Use: Never used  Substance and Sexual Activity  . Alcohol use: No  . Drug use: No  . Sexual activity: Not on file  Other Topics Concern  . 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 [place]     Social Determinants of Health   Financial Resource Strain: Not on file  Food Insecurity: Not on file  Transportation Needs: Not on file  Physical Activity: Not on file  Stress: Not on file  Social Connections: Not on file  Intimate Partner Violence: Not on file    FAMILY HISTORY: Family History  Problem Relation Age of  Onset  . Breast cancer Neg Hx     ALLERGIES:  is allergic to penicillins, metformin, and sulfa antibiotics.  MEDICATIONS:  Current Outpatient Medications  Medication Sig Dispense Refill  . acetaminophen (TYLENOL) 500 MG tablet Take 1,000 mg by mouth every 6 (six) hours as needed (for pain.).     Marland Kitchen albuterol (VENTOLIN HFA) 108 (90 Base) MCG/ACT inhaler Inhale 1-2 puffs into the lungs every 6 (six) hours as needed for wheezing or shortness of breath. 8 g 1  . benzonatate (TESSALON) 200 MG capsule Take 200 mg by mouth 3 (three) times daily as needed for cough.    . chlorpheniramine-HYDROcodone (TUSSIONEX) 10-8 MG/5ML SUER Take 5 mLs by mouth at bedtime as needed for cough. 140 mL 0  . diazepam (VALIUM) 5 MG tablet Take 5 mg by mouth daily as needed (for vertigo).    . ezetimibe (ZETIA) 10 MG tablet Take 10 mg by mouth daily.     . Fluticasone-Salmeterol (ADVAIR) 250-50 MCG/DOSE AEPB Inhale 1 puff into the lungs 2 (two) times daily.    . furosemide (LASIX) 20 MG tablet Take 20 mg by mouth daily as needed (fluid retention.).    Marland Kitchen hyoscyamine (LEVBID) 0.375 MG 12 hr tablet Take 0.375 mg  by mouth daily as needed (abdominal cramping).     . Ipratropium-Albuterol (COMBIVENT) 20-100 MCG/ACT AERS respimat Inhale 1 puff into the lungs every 4 (four) hours as needed for wheezing.    Marland Kitchen LANTUS SOLOSTAR 100 UNIT/ML Solostar Pen Inject 12-20 Units into the skin 2 (two) times daily. 12U in AM, 20U in PM    . Multiple Vitamin (MULTIVITAMIN WITH MINERALS) TABS tablet Take 1 tablet by mouth daily. One-A-Day for Women    . nicotine polacrilex (NICORETTE) 4 MG gum Take 4 mg by mouth in the morning, at noon, and at bedtime.    Marland Kitchen nystatin (MYCOSTATIN) 100000 UNIT/ML suspension Take 5 mLs by mouth 2 (two) times daily as needed (throat irritation.).    Marland Kitchen omeprazole (PRILOSEC) 20 MG capsule Take 20 mg by mouth daily before breakfast.     . predniSONE (DELTASONE) 10 MG tablet Take 1 tablet by mouth daily.    .  rosuvastatin (CRESTOR) 40 MG tablet Take 40 mg by mouth every evening.     . vitamin B-12 (CYANOCOBALAMIN) 1000 MCG tablet Take 1,000 mcg by mouth daily.    Marland Kitchen lidocaine-prilocaine (EMLA) cream Apply 1 application topically as needed. 30 g 3  . ondansetron (ZOFRAN) 8 MG tablet Take 1 tablet (8 mg total) by mouth every 8 (eight) hours as needed for nausea or vomiting. 20 tablet 3  . prochlorperazine (COMPAZINE) 10 MG tablet Take 1 tablet (10 mg total) by mouth every 6 (six) hours as needed for nausea or vomiting. 30 tablet 3   No current facility-administered medications for this visit.      Marland Kitchen  PHYSICAL EXAMINATION: ECOG PERFORMANCE STATUS: 0 - Asymptomatic  Vitals:   12/04/20 1430  BP: 124/62  Pulse: 83  Resp: 20  Temp: 98.5 F (36.9 C)  SpO2: 99%   Filed Weights   12/04/20 1430  Weight: 134 lb (60.8 kg)    Physical Exam Constitutional:      Comments: Thin built Caucasian female patient.  She is ambulating independently.  Accompanied by daughter.  HENT:     Head: Normocephalic and atraumatic.     Mouth/Throat:     Pharynx: No oropharyngeal exudate.  Eyes:     Pupils: Pupils are equal, round, and reactive to light.  Cardiovascular:     Rate and Rhythm: Normal rate and regular rhythm.  Pulmonary:     Effort: No respiratory distress.     Breath sounds: No wheezing.     Comments: Decreased air entry bilaterally.  No wheeze or crackles. Abdominal:     General: Bowel sounds are normal. There is no distension.     Palpations: Abdomen is soft. There is no mass.     Tenderness: There is no abdominal tenderness. There is no guarding or rebound.  Musculoskeletal:        General: No tenderness. Normal range of motion.     Cervical back: Normal range of motion and neck supple.  Skin:    General: Skin is warm.  Neurological:     Mental Status: She is alert and oriented to person, place, and time.  Psychiatric:        Mood and Affect: Affect normal.    LABORATORY DATA:  I  have reviewed the data as listed Lab Results  Component Value Date   WBC 7.6 11/28/2020   HGB 12.1 11/28/2020   HCT 36.0 11/28/2020   MCV 91.1 11/28/2020   PLT 310 11/28/2020   Recent Labs    05/02/20 1303  11/09/20 1009  NA  --  136  K  --  4.2  CL  --  104  CO2  --  23  GLUCOSE  --  230*  BUN  --  13  CREATININE 1.10* 1.10*  CALCIUM  --  8.4*  GFRNONAA  --  52*  PROT  --  6.4*  ALBUMIN  --  2.6*  AST  --  18  ALT  --  16  ALKPHOS  --  76  BILITOT  --  0.4    RADIOGRAPHIC STUDIES: I have personally reviewed the radiological images as listed and agreed with the findings in the report. CT Chest Wo Contrast  Result Date: 11/06/2020 CLINICAL DATA:  Lung cancer, assess treatment response. Radiation completed April 2021. Persistent shortness of breath. EXAM: CT CHEST WITHOUT CONTRAST TECHNIQUE: Multidetector CT imaging of the chest was performed following the standard protocol without IV contrast. COMPARISON:  05/02/2020. FINDINGS: Cardiovascular: Atherosclerotic calcification of the aorta, aortic valve and coronary arteries. Heart size normal. No pericardial effusion. Mediastinum/Nodes: Low internal jugular and high left paratracheal lymph nodes are subcentimeter in size but have increased in size from 05/02/2020. Index low left internal jugular lymph node measures 6 mm (2/14), compared to 3 mm. High left paratracheal lymph node measures 7 mm (2/20), compared to 3 mm. Additional mediastinal lymph nodes have also increased in size in the interval. Index low right paratracheal lymph node measures 11 mm (2/57), previously 4 mm. Hilar regions are difficult to evaluate without IV contrast. No axillary adenopathy. Esophagus is grossly unremarkable. Lungs/Pleura: Post radiation bronchiectasis, scarring and volume loss in the left upper lobe. Small nodules in the superior segment left lower lobe (3/74 and 78) are unchanged. However, there is new septal thickening and peribronchovascular  nodularity more inferiorly within the left lower lobe. Trace pleural fluid or pleural thickening on the left. Airway is unremarkable. Upper Abdomen: There is a new vague rounded area decreased attenuation in the peripheral right hepatic lobe, measuring 3.2 x 3.5 cm (2/139). Well-circumscribed low-attenuation lesions in the left hepatic lobe measure up to 3.7 cm, previously characterized as cysts. Visualized portions of the liver, gallbladder, adrenal glands, kidneys, spleen, pancreas, stomach and bowel are otherwise unremarkable with the exception of a tiny hiatal hernia. Periportal lymph nodes are not enlarged by CT size criteria. Musculoskeletal: No worrisome lytic or sclerotic lesions. IMPRESSION: 1. 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 2. Aortic atherosclerosis (ICD10-I70.0). Coronary artery calcification. Electronically Signed   By: Lorin Picket M.D.   On: 11/06/2020 11:57   NM PET Image Restag (PS) Skull Base To Thigh  Result Date: 11/22/2020 CLINICAL DATA:  Subsequent treatment strategy for non-small cell left lower lobe lung cancer detected in 2020 status post chemotherapy and radiation therapy, with findings worrisome for progression of metastatic disease on recent chest CT. EXAM: NUCLEAR MEDICINE PET SKULL BASE TO THIGH TECHNIQUE: 7.0 mCi F-18 FDG was injected intravenously. Full-ring PET imaging was performed from the skull base to thigh after the radiotracer. CT data was obtained and used for attenuation correction and anatomic localization. Fasting blood glucose: 106 mg/dl COMPARISON:  11/06/2020 chest CT.  09/27/2019 PET-CT. FINDINGS: Mediastinal blood pool activity: SUV max 2.2 Liver activity: SUV max NA NECK: New hypermetabolic 0.8 cm low right level 4 neck lymph node with max SUV 3.8 (series 3/image 51) at the thoracic inlet. New  hypermetabolic 0.8  cm low left level 4 neck lymph node with max SUV 5.1 (series 3/image 53) at the thoracic inlet. Incidental CT findings: none CHEST: New enlarged hypermetabolic right paratracheal lymph nodes, largest 1.3 cm with max SUV 6.6 (series 3/image 74). New enlarged hypermetabolic 1.1 cm high left mediastinal lymph node in the tracheoesophageal groove with max SUV 6.5 (series 3/image 56). No enlarged or hypermetabolic axillary or hilar lymph nodes. Indistinct low level activity associated with nodular septal thickening in the basilar left lower lobe with max SUV 2.3 (series 3/image 110). No residual hypermetabolism at the site of the treated 1.0 cm central left lower lobe pulmonary nodule with max SUV 1.2 (series 3/image 89). Bandlike lingular consolidation with associated mild volume loss and distortion, compatible with evolving radiation change. Incidental CT findings: Coronary atherosclerosis. Atherosclerotic nonaneurysmal thoracic aorta. ABDOMEN/PELVIS: New hypermetabolic 4.2 x 4.0 cm peripheral right liver hypodense mass with max SUV 12.5 (series 3/image 120). No additional hypermetabolic liver lesions. No abnormal hypermetabolic activity within the pancreas, adrenal glands, or spleen. No hypermetabolic lymph nodes in the abdomen or pelvis. Incidental CT findings: Simple left liver cysts, largest 3.7 cm. Atherosclerotic nonaneurysmal abdominal aorta. Hysterectomy. SKELETON: No focal hypermetabolic activity to suggest skeletal metastasis. Incidental CT findings: none IMPRESSION: 1. New hypermetabolic nodal metastases in the bilateral lower neck/thoracic inlet and bilateral mediastinum. 2. Indistinct low level activity associated with new nodular septal thickening in the basilar left lower lung lobe, compatible with lymphangitic carcinomatosis. 3. New solitary hypermetabolic 4.2 cm peripheral right liver metastasis. 4. No residual hypermetabolism at the site of the treated 1.0 cm central left lower lobe  pulmonary nodule. Evolving radiation changes in the lingula. 5. Chronic findings include: Aortic Atherosclerosis (ICD10-I70.0). Coronary atherosclerosis. Electronically Signed   By: Ilona Sorrel M.D.   On: 11/22/2020 11:20   US BIOPSY (LIVER)  Result Date: 11/28/2020 INDICATION: 75 year old female possible metastatic disease to the liver versus primary liver lesion. She has been referred for biopsy EXAM: ULTRASOUND-GUIDED LIVER MASS BIOPSY MEDICATIONS: None. ANESTHESIA/SEDATION: Moderate (conscious) sedation was employed during this procedure. A total of Versed 1.5 mg and Fentanyl 100 mcg was administered intravenously. Moderate Sedation Time: 15 minutes. The patient's level of consciousness and vital signs were monitored continuously by radiology nursing throughout the procedure under my direct supervision. FLUOROSCOPY TIME:  Ultrasound COMPLICATIONS: None PROCEDURE: Informed written consent was obtained from the patient after a thorough discussion of the procedural risks, benefits and alternatives. All questions were addressed. Maximal Sterile Barrier Technique was utilized including caps, mask, sterile gowns, sterile gloves, sterile drape, hand hygiene and skin antiseptic. A timeout was performed prior to the initiation of the procedure. Ultrasound survey of the right liver lobe performed with images stored and sent to PACs. The right lower thorax/right upper abdomen was prepped with chlorhexidine in a sterile fashion, and a sterile drape was applied covering the operative field. A sterile gown and sterile gloves were used for the procedure. Local anesthesia was provided with 1% Lidocaine. The patient was prepped and draped sterilely and the skin and subcutaneous tissues were generously infiltrated with 1% lidocaine. A 17 gauge introducer needle was then advanced under ultrasound guidance in an intercostal location into the right liver lobe, targeting the hypoechoic mass of the right liver. The stylet was  removed, and multiple separate 18 gauge core biopsy were retrieved. Samples were placed into formalin for transportation to the lab. Gel-Foam pledgets were then infused with a small amount of saline for assistance with hemostasis. The needle was removed, and  a final ultrasound image was performed. The patient tolerated the procedure well and remained hemodynamically stable throughout. No complications were encountered and no significant blood loss was encounter. IMPRESSION: Status post ultrasound-guided right liver mass biopsy. Signed, Dulcy Fanny. Dellia Nims, RPVI [facility] Radiology Electronically Signed   By: Corrie Mckusick D.O.   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  Will need MRI brain.  #Recommend NGS testing-we will help decide on the best treatment option.  #However given patient's extent of disease-I would recommend starting chemotherapy with carboplatin Alimta ASAP.  Also discussed regarding carbo Alimta Keytruda. We will modify the treatment protocol based on NGS results.  Understands treatments are palliative not curative.  Discussed the potential side effects including but not limited to-increasing fatigue, nausea vomiting, diarrhea, hair loss, sores in the mouth, increase risk of infection and also neuropathy.  I discussed the mechanism of action; The goal of therapy is palliative; and length of treatments are likely ongoing/based upon the results of the scans. Discussed the  potential side effects of immunotherapy including but not limited to diarrhea; skin rash; elevated LFTs/endocrine abnormalities etc.  #Recommend port placement; antiemetics; folic acid; dexamethasone; EMLA cream.  #Shortness of breath-chronic likely secondary COPD/follow-up with Dr. Dereck Leep.   # DISPOSITION:  # Chemo education asap- carbo-alimat- keytruda; B12 injection # port placement ASAP #  Follow in 10 days- MD; labs- cbc/cmp/Ca-19-9; CEA; carbo-Alimta-Dr.B  # I reviewed the blood work- with the patient in detail; also reviewed the imaging independently [as summarized above]; and with the patient in detail.   All questions were answered. The patient knows to call the clinic with any problems, questions or concerns.    Cammie Sickle, MD 12/04/2020 4:49 PM

## 2020-12-04 NOTE — Progress Notes (Signed)
START ON PATHWAY REGIMEN - Non-Small Cell Lung     A cycle is every 21 days:     Pemetrexed      Carboplatin   **Always confirm dose/schedule in your pharmacy ordering system**  Patient Characteristics: Stage IV Metastatic, Nonsquamous, Awaiting Molecular Test Results and Need to Start Chemotherapy, PS = 0, 1 Therapeutic Status: Stage IV Metastatic Histology: Nonsquamous Cell Broad Molecular Profiling Status: Awaiting Molecular Test Results and Need to Start Chemotherapy ECOG Performance Status: 0 Intent of Therapy: Non-Curative / Palliative Intent, Discussed with Patient

## 2020-12-04 NOTE — Assessment & Plan Note (Addendum)
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  Will need MRI brain.  #Recommend NGS testing-we will help decide on the best treatment option.  #However given patient's extent of disease-I would recommend starting chemotherapy with carboplatin Alimta ASAP.  Also discussed regarding carbo Alimta Keytruda. We will modify the treatment protocol based on NGS results.  Understands treatments are palliative not curative.  Discussed the potential side effects including but not limited to-increasing fatigue, nausea vomiting, diarrhea, hair loss, sores in the mouth, increase risk of infection and also neuropathy.  I discussed the mechanism of action; The goal of therapy is palliative; and length of treatments are likely ongoing/based upon the results of the scans. Discussed the potential side effects of immunotherapy including but not limited to diarrhea; skin rash; elevated LFTs/endocrine abnormalities etc.  #Recommend port placement; antiemetics; folic acid; dexamethasone; EMLA cream.  #Shortness of breath-chronic likely secondary COPD/follow-up with Dr. Dereck Leep.   # DISPOSITION:  # Chemo education asap- carbo-alimat- keytruda; B12 injection # port placement ASAP #  Follow in 10 days- MD; labs- cbc/cmp/Ca-19-9; CEA; carbo-Alimta-Dr.B  # I reviewed the blood work- with the patient in detail; also reviewed the imaging independently [as summarized above]; and with the patient in detail.

## 2020-12-05 ENCOUNTER — Other Ambulatory Visit: Payer: Self-pay | Admitting: *Deleted

## 2020-12-05 ENCOUNTER — Encounter: Payer: Self-pay | Admitting: Internal Medicine

## 2020-12-05 MED ORDER — PROCHLORPERAZINE MALEATE 10 MG PO TABS: 10.0000 mg | ORAL_TABLET | Freq: Four times a day (QID) | ORAL | 3 refills | Status: AC | PRN

## 2020-12-05 MED ORDER — LIDOCAINE-PRILOCAINE 2.5-2.5 % EX CREA: 1.0000 "application " | TOPICAL_CREAM | CUTANEOUS | 3 refills | Status: AC | PRN

## 2020-12-05 MED ORDER — DEXAMETHASONE 4 MG PO TABS: ORAL_TABLET | ORAL | 0 refills | Status: DC

## 2020-12-05 MED ORDER — ONDANSETRON HCL 8 MG PO TABS: 8.0000 mg | ORAL_TABLET | Freq: Three times a day (TID) | ORAL | 3 refills | Status: AC | PRN

## 2020-12-05 NOTE — Patient Instructions (Signed)
Pemetrexed injection What is this medicine? PEMETREXED (PEM e TREX ed) is a chemotherapy drug used to treat lung cancers like non-small cell lung cancer and mesothelioma. It may also be used to treat other cancers. This medicine may be used for other purposes; ask your health care provider or pharmacist if you have questions. COMMON BRAND NAME(S): Alimta What should I tell my health care provider before I take this medicine? They need to know if you have any of these conditions:  infection (especially a virus infection such as chickenpox, cold sores, or herpes)  kidney disease  low blood counts, like low white cell, platelet, or red cell counts  lung or breathing disease, like asthma  radiation therapy  an unusual or allergic reaction to pemetrexed, other medicines, foods, dyes, or preservative  pregnant or trying to get pregnant  breast-feeding How should I use this medicine? This drug is given as an infusion into a vein. It is administered in a hospital or clinic by a specially trained health care professional. Talk to your pediatrician regarding the use of this medicine in children. Special care may be needed. Overdosage: If you think you have taken too much of this medicine contact a poison control center or emergency room at once. NOTE: This medicine is only for you. Do not share this medicine with others. What if I miss a dose? It is important not to miss your dose. Call your doctor or health care professional if you are unable to keep an appointment. What may interact with this medicine? This medicine may interact with the following medications:  Ibuprofen This list may not describe all possible interactions. Give your health care provider a list of all the medicines, herbs, non-prescription drugs, or dietary supplements you use. Also tell them if you smoke, drink alcohol, or use illegal drugs. Some items may interact with your medicine. What should I watch for while using  this medicine? Visit your doctor for checks on your progress. This drug may make you feel generally unwell. This is not uncommon, as chemotherapy can affect healthy cells as well as cancer cells. Report any side effects. Continue your course of treatment even though you feel ill unless your doctor tells you to stop. In some cases, you may be given additional medicines to help with side effects. Follow all directions for their use. Call your doctor or health care professional for advice if you get a fever, chills or sore throat, or other symptoms of a cold or flu. Do not treat yourself. This drug decreases your body's ability to fight infections. Try to avoid being around people who are sick. This medicine may increase your risk to bruise or bleed. Call your doctor or health care professional if you notice any unusual bleeding. Be careful brushing and flossing your teeth or using a toothpick because you may get an infection or bleed more easily. If you have any dental work done, tell your dentist you are receiving this medicine. Avoid taking products that contain aspirin, acetaminophen, ibuprofen, naproxen, or ketoprofen unless instructed by your doctor. These medicines may hide a fever. Call your doctor or health care professional if you get diarrhea or mouth sores. Do not treat yourself. To protect your kidneys, drink water or other fluids as directed while you are taking this medicine. Do not become pregnant while taking this medicine or for 6 months after stopping it. Women should inform their doctor if they wish to become pregnant or think they might be pregnant. Men should  not father a child while taking this medicine and for 3 months after stopping it. This may interfere with the ability to father a child. You should talk to your doctor or health care professional if you are concerned about your fertility. There is a potential for serious side effects to an unborn child. Talk to your health care  professional or pharmacist for more information. Do not breast-feed an infant while taking this medicine or for 1 week after stopping it. What side effects may I notice from receiving this medicine? Side effects that you should report to your doctor or health care professional as soon as possible:  allergic reactions like skin rash, itching or hives, swelling of the face, lips, or tongue  breathing problems  redness, blistering, peeling or loosening of the skin, including inside the mouth  signs and symptoms of bleeding such as bloody or black, tarry stools; red or dark-brown urine; spitting up blood or brown material that looks like coffee grounds; red spots on the skin; unusual bruising or bleeding from the eye, gums, or nose  signs and symptoms of infection like fever or chills; cough; sore throat; pain or trouble passing urine  signs and symptoms of kidney injury like trouble passing urine or change in the amount of urine  signs and symptoms of liver injury like dark yellow or brown urine; general ill feeling or flu-like symptoms; light-colored stools; loss of appetite; nausea; right upper belly pain; unusually weak or tired; yellowing of the eyes or skin Side effects that usually do not require medical attention (report to your doctor or health care professional if they continue or are bothersome):  constipation  mouth sores  nausea, vomiting  unusually weak or tired This list may not describe all possible side effects. Call your doctor for medical advice about side effects. You may report side effects to FDA at 1-800-FDA-1088. Where should I keep my medicine? This drug is given in a hospital or clinic and will not be stored at home. NOTE: This sheet is a summary. It may not cover all possible information. If you have questions about this medicine, talk to your doctor, pharmacist, or health care provider.  2021 Elsevier/Gold Standard (2017-11-11 16:11:33) Carboplatin  injection What is this medicine? CARBOPLATIN (KAR boe pla tin) is a chemotherapy drug. It targets fast dividing cells, like cancer cells, and causes these cells to die. This medicine is used to treat ovarian cancer and many other cancers. This medicine may be used for other purposes; ask your health care provider or pharmacist if you have questions. COMMON BRAND NAME(S): Paraplatin What should I tell my health care provider before I take this medicine? They need to know if you have any of these conditions:  blood disorders  hearing problems  kidney disease  recent or ongoing radiation therapy  an unusual or allergic reaction to carboplatin, cisplatin, other chemotherapy, other medicines, foods, dyes, or preservatives  pregnant or trying to get pregnant  breast-feeding How should I use this medicine? This drug is usually given as an infusion into a vein. It is administered in a hospital or clinic by a specially trained health care professional. Talk to your pediatrician regarding the use of this medicine in children. Special care may be needed. Overdosage: If you think you have taken too much of this medicine contact a poison control center or emergency room at once. NOTE: This medicine is only for you. Do not share this medicine with others. What if I miss a dose?  It is important not to miss a dose. Call your doctor or health care professional if you are unable to keep an appointment. What may interact with this medicine?  medicines for seizures  medicines to increase blood counts like filgrastim, pegfilgrastim, sargramostim  some antibiotics like amikacin, gentamicin, neomycin, streptomycin, tobramycin  vaccines Talk to your doctor or health care professional before taking any of these medicines:  acetaminophen  aspirin  ibuprofen  ketoprofen  naproxen This list may not describe all possible interactions. Give your health care provider a list of all the medicines,  herbs, non-prescription drugs, or dietary supplements you use. Also tell them if you smoke, drink alcohol, or use illegal drugs. Some items may interact with your medicine. What should I watch for while using this medicine? Your condition will be monitored carefully while you are receiving this medicine. You will need important blood work done while you are taking this medicine. This drug may make you feel generally unwell. This is not uncommon, as chemotherapy can affect healthy cells as well as cancer cells. Report any side effects. Continue your course of treatment even though you feel ill unless your doctor tells you to stop. In some cases, you may be given additional medicines to help with side effects. Follow all directions for their use. Call your doctor or health care professional for advice if you get a fever, chills or sore throat, or other symptoms of a cold or flu. Do not treat yourself. This drug decreases your body's ability to fight infections. Try to avoid being around people who are sick. This medicine may increase your risk to bruise or bleed. Call your doctor or health care professional if you notice any unusual bleeding. Be careful brushing and flossing your teeth or using a toothpick because you may get an infection or bleed more easily. If you have any dental work done, tell your dentist you are receiving this medicine. Avoid taking products that contain aspirin, acetaminophen, ibuprofen, naproxen, or ketoprofen unless instructed by your doctor. These medicines may hide a fever. Do not become pregnant while taking this medicine. Women should inform their doctor if they wish to become pregnant or think they might be pregnant. There is a potential for serious side effects to an unborn child. Talk to your health care professional or pharmacist for more information. Do not breast-feed an infant while taking this medicine. What side effects may I notice from receiving this medicine? Side  effects that you should report to your doctor or health care professional as soon as possible:  allergic reactions like skin rash, itching or hives, swelling of the face, lips, or tongue  signs of infection - fever or chills, cough, sore throat, pain or difficulty passing urine  signs of decreased platelets or bleeding - bruising, pinpoint red spots on the skin, black, tarry stools, nosebleeds  signs of decreased red blood cells - unusually weak or tired, fainting spells, lightheadedness  breathing problems  changes in hearing  changes in vision  chest pain  high blood pressure  low blood counts - This drug may decrease the number of white blood cells, red blood cells and platelets. You may be at increased risk for infections and bleeding.  nausea and vomiting  pain, swelling, redness or irritation at the injection site  pain, tingling, numbness in the hands or feet  problems with balance, talking, walking  trouble passing urine or change in the amount of urine Side effects that usually do not require medical attention (  report to your doctor or health care professional if they continue or are bothersome):  hair loss  loss of appetite  metallic taste in the mouth or changes in taste This list may not describe all possible side effects. Call your doctor for medical advice about side effects. You may report side effects to FDA at 1-800-FDA-1088. Where should I keep my medicine? This drug is given in a hospital or clinic and will not be stored at home. NOTE: This sheet is a summary. It may not cover all possible information. If you have questions about this medicine, talk to your doctor, pharmacist, or health care provider.  2021 Elsevier/Gold Standard (2007-12-28 14:38:05)

## 2020-12-06 ENCOUNTER — Other Ambulatory Visit: Payer: Medicare Other

## 2020-12-06 ENCOUNTER — Inpatient Hospital Stay: Payer: Medicare Other

## 2020-12-06 DIAGNOSIS — C3432 Malignant neoplasm of lower lobe, left bronchus or lung: Secondary | ICD-10-CM

## 2020-12-06 NOTE — Progress Notes (Signed)
Tumor Board Documentation  KIMMBERLY WISSER was presented by Dr Rogue Bussing at our Tumor Board on 12/06/2020, which included representatives from medical oncology,radiation oncology,internal medicine,navigation,pathology,radiology,surgical,pharmacy,genetics,research,palliative care,pulmonology.  Janeka currently presents as a current patient,for MDC,for new positive pathology with history of the following treatments: active survellience.  Additionally, we reviewed previous medical and familial history, history of present illness, and recent lab results along with all available histopathologic and imaging studies. The tumor board considered available treatment options and made the following recommendations: Immunotherapy    The following procedures/referrals were also placed: No orders of the defined types were placed in this encounter.   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  Tumor board is a meeting of clinicians from various specialty areas who evaluate and discuss patients for whom a multidisciplinary approach is being considered. Final determinations in the plan of care are those of the provider(s). The responsibility for follow up of recommendations given during tumor board is that of the provider.   Today's extended care, comprehensive team conference, Amairany was not present for the discussion and was not examined.   Multidisciplinary Tumor Board is a multidisciplinary case peer review process.  Decisions discussed in the Multidisciplinary Tumor Board reflect the opinions of the specialists present at the conference without having examined the patient.  Ultimately, treatment and diagnostic decisions rest with the primary provider(s) and the patient.

## 2020-12-06 NOTE — Research (Signed)
Trial:  [facility]  Patient Donna Brennan was identified by Jeral Fruit, RN, research nurse, 3 as a potential candidate for the above listed study.  This Clinical Research Nurse met with CINCERE DEPREY, [id number], on 12/06/20 in a manner and location that ensures patient privacy to discuss participation in the above listed research study.  Patient is Accompanied by her spouse.  A copy of the informed consent document and separate HIPAA Authorization was provided to the patient.  Patient reads, speaks, and understands [place].   Patient was provided with the business card of this Nurse and encouraged to contact the research team with any questions.  Approximately 15 minutes were spent with the patient reviewing the informed consent documents.  Patient was provided the option of taking informed consent documents home to review and was encouraged to review at their convenience with their support network, including other care providers. Patient took the consent documents home to review. Research nurse will follow up with the patient in a couple of days for her interest in participating. Jeral Fruit, RN 12/06/20 2:18 PM

## 2020-12-07 ENCOUNTER — Other Ambulatory Visit: Payer: Self-pay

## 2020-12-07 ENCOUNTER — Encounter: Payer: Self-pay | Admitting: Radiation Oncology

## 2020-12-07 ENCOUNTER — Ambulatory Visit
Admission: RE | Admit: 2020-12-07 | Discharge: 2020-12-07 | Disposition: A | Discharge status: Home / Self Care | Payer: Medicare Other | Source: Ambulatory Visit | Attending: Radiation Oncology | Admitting: Radiation Oncology

## 2020-12-07 VITALS — BP 122/76 | HR 92 | Temp 97.3°F | Wt 134.8 lb

## 2020-12-07 DIAGNOSIS — C3432 Malignant neoplasm of lower lobe, left bronchus or lung: Secondary | ICD-10-CM

## 2020-12-07 NOTE — Progress Notes (Signed)
Radiation Oncology Follow up Note  Name: Donna Brennan   Date:   12/07/2020 MRN:  [id number] DOB: June 04, 1946    This 75 y.o. 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  REFERRING PROVIDER: Adin Hector, MD  HPI: Patient is a 75 year old female now out 10 months having a pleated SBRT to her left lung for presumed stage I non-small cell lung cancer.  Unfortunately patient is gone on to develop by PET/CT criteria possible lymphangitic spread in the left lower lobe mediastinal hypermetabolic activity as well as a 4 cm lesion in her liver..  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  She is having a port placed is going to be treated with chemotherapy treating this as a stage IV lung cancer.  She is also having NGS testing to determine if there is immunotherapy potential for treating her tumor.  She is asymptomatic this time is a mild cough no hemoptysis.  No bony pain and there were no bony lesions on her PET/CT.  Her case was presented at our tumor conference.  COMPLICATIONS OF TREATMENT: none  FOLLOW UP COMPLIANCE: keeps appointments   PHYSICAL EXAM:  BP 122/76   Pulse 92   Temp (!) 97.3 F (36.3 C) (Tympanic)   Wt 134 lb 12.8 oz (61.1 kg)   BMI 25.06 kg/m  Well-developed well-nourished patient in NAD. HEENT reveals PERLA, EOMI, discs not visualized.  Oral cavity is clear. No oral mucosal lesions are identified. Neck is clear without evidence of cervical or supraclavicular adenopathy. Lungs are clear to A&P. Cardiac examination is essentially unremarkable with regular rate and rhythm without murmur rub or thrill. Abdomen is benign with no organomegaly or masses noted. Motor sensory and DTR levels are equal and  symmetric in the upper and lower extremities. Cranial nerves II through XII are grossly intact. Proprioception is intact. No peripheral adenopathy or edema is identified. No motor or sensory levels are noted. Crude visual fields are within normal range.  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  She is slated to be treated with chemotherapy by Dr. Jacinto Reap.  Also will have NGS testing to determine viability of immunotherapy.  I will see her back in 6 months for follow-up.  Be happy to reevaluate her anytime should palliative treatment be indicated.  Patient comprehends my recommendations well.  I would like to take this opportunity to thank you for allowing me to participate in the care of your patient.Noreene Filbert, MD

## 2020-12-10 NOTE — Progress Notes (Signed)
Patient on schedule for port placement 12/13/2020, called and spoke with patient on phone with instructions given. Made aware to be here @ 0930, NPO after MN prior to procedure, and driver for discharge post procedure,recovery. Holding am dose of insulin, and half dose hs prior. Stated understanding.

## 2020-12-11 ENCOUNTER — Other Ambulatory Visit: Payer: Self-pay

## 2020-12-11 ENCOUNTER — Other Ambulatory Visit
Admission: RE | Admit: 2020-12-11 | Discharge: 2020-12-11 | Disposition: A | Discharge status: Home / Self Care | Payer: Medicare Other | Source: Ambulatory Visit | Attending: Vascular Surgery | Admitting: Vascular Surgery

## 2020-12-11 DIAGNOSIS — Z01812 Encounter for preprocedural laboratory examination: Secondary | ICD-10-CM | POA: Diagnosis present

## 2020-12-11 DIAGNOSIS — Z20822 Contact with and (suspected) exposure to covid-19: Secondary | ICD-10-CM | POA: Insufficient documentation

## 2020-12-11 LAB — SARS CORONAVIRUS 2 (TAT 6-24 HRS): SARS Coronavirus 2: NEGATIVE

## 2020-12-12 ENCOUNTER — Encounter: Payer: Self-pay | Admitting: Internal Medicine

## 2020-12-12 ENCOUNTER — Other Ambulatory Visit: Payer: Self-pay | Admitting: Radiology

## 2020-12-13 ENCOUNTER — Ambulatory Visit
Admission: RE | Admit: 2020-12-13 | Discharge: 2020-12-13 | Disposition: A | Discharge status: Home / Self Care | Payer: Medicare Other | Source: Ambulatory Visit | Attending: Internal Medicine | Admitting: Internal Medicine

## 2020-12-13 ENCOUNTER — Encounter: Payer: Self-pay | Admitting: Internal Medicine

## 2020-12-13 ENCOUNTER — Other Ambulatory Visit: Payer: Self-pay

## 2020-12-13 DIAGNOSIS — Z882 Allergy status to sulfonamides status: Secondary | ICD-10-CM | POA: Insufficient documentation

## 2020-12-13 DIAGNOSIS — Z79899 Other long term (current) drug therapy: Secondary | ICD-10-CM | POA: Insufficient documentation

## 2020-12-13 DIAGNOSIS — Z880 Allergy status to penicillin: Secondary | ICD-10-CM | POA: Insufficient documentation

## 2020-12-13 DIAGNOSIS — F17210 Nicotine dependence, cigarettes, uncomplicated: Secondary | ICD-10-CM | POA: Diagnosis not present

## 2020-12-13 DIAGNOSIS — C3490 Malignant neoplasm of unspecified part of unspecified bronchus or lung: Secondary | ICD-10-CM | POA: Insufficient documentation

## 2020-12-13 DIAGNOSIS — C3432 Malignant neoplasm of lower lobe, left bronchus or lung: Secondary | ICD-10-CM

## 2020-12-13 DIAGNOSIS — Z794 Long term (current) use of insulin: Secondary | ICD-10-CM | POA: Insufficient documentation

## 2020-12-13 DIAGNOSIS — Z888 Allergy status to other drugs, medicaments and biological substances status: Secondary | ICD-10-CM | POA: Diagnosis not present

## 2020-12-13 HISTORY — PX: IR IMAGING GUIDED PORT INSERTION: IMG5740

## 2020-12-13 LAB — GLUCOSE, CAPILLARY: Glucose-Capillary: 165 mg/dL — ABNORMAL HIGH (ref 70–99)

## 2020-12-13 MED ORDER — MIDAZOLAM HCL 2 MG/2ML IJ SOLN
INTRAMUSCULAR | Status: AC
  Filled 2020-12-13: qty 2

## 2020-12-13 MED ORDER — MIDAZOLAM HCL 2 MG/2ML IJ SOLN
INTRAMUSCULAR | Status: AC | PRN
  Administered 2020-12-13 (×2): 1 mg via INTRAVENOUS

## 2020-12-13 MED ORDER — SODIUM CHLORIDE 0.9 % IV SOLN: INTRAVENOUS | Status: DC

## 2020-12-13 MED ORDER — FENTANYL CITRATE (PF) 100 MCG/2ML IJ SOLN
INTRAMUSCULAR | Status: AC
  Filled 2020-12-13: qty 2

## 2020-12-13 MED ORDER — FENTANYL CITRATE (PF) 100 MCG/2ML IJ SOLN
INTRAMUSCULAR | Status: AC | PRN
  Administered 2020-12-13 (×2): 50 ug via INTRAVENOUS

## 2020-12-13 NOTE — Discharge Instructions (Signed)
Moderate Conscious Sedation, Adult, Care After This sheet gives you information about how to care for yourself after your procedure. Your health care provider may also give you more specific instructions. If you have problems or questions, contact your health care provider. What can I expect after the procedure? After the procedure, it is common to have:  Sleepiness for several hours.  Impaired judgment for several hours.  Difficulty with balance.  Vomiting if you eat too soon. Follow these instructions at home: For the time period you were told by your health care provider:  Rest.  Do not participate in activities where you could fall or become injured.  Do not drive or use machinery.  Do not drink alcohol.  Do not take sleeping pills or medicines that cause drowsiness.  Do not make important decisions or sign legal documents.  Do not take care of children on your own.      Eating and drinking  Follow the diet recommended by your health care provider.  Drink enough fluid to keep your urine pale yellow.  If you vomit: ? Drink water, juice, or soup when you can drink without vomiting. ? Make sure you have little or no nausea before eating solid foods.   General instructions  Take over-the-counter and prescription medicines only as told by your health care provider.  Have a responsible adult stay with you for the time you are told. It is important to have someone help care for you until you are awake and alert.  Do not smoke.  Keep all follow-up visits as told by your health care provider. This is important. Contact a health care provider if:  You are still sleepy or having trouble with balance after 24 hours.  You feel light-headed.  You keep feeling nauseous or you keep vomiting.  You develop a rash.  You have a fever.  You have redness or swelling around the IV site. Get help right away if:  You have trouble breathing.  You have new-onset confusion at  home. Summary  After the procedure, it is common to feel sleepy, have impaired judgment, or feel nauseous if you eat too soon.  Rest after you get home. Know the things you should not do after the procedure.  Follow the diet recommended by your health care provider and drink enough fluid to keep your urine pale yellow.  Get help right away if you have trouble breathing or new-onset confusion at home. This information is not intended to replace advice given to you by your health care provider. Make sure you discuss any questions you have with your health care provider. Document Revised: 01/20/2020 Document Reviewed: 08/18/2019 Elsevier Patient Education  2021 [place] Catheter Insertion, Care After This sheet gives you information about how to care for yourself after your procedure. Your health care provider may also give you more specific instructions. If you have problems or questions, contact your health care provider. What can I expect after the procedure? After the procedure, it is common to have:  Some mild redness, bruising, swelling, and pain around your catheter site.  A small amount of blood or clear fluid coming from your incisions. Follow these instructions at home: Incision care  Follow instructions from your health care provider about how to take care of your incisions. Make sure you: ? Wash your hands with soap and water before and after you change your bandages (dressings). If soap and water are not available, use hand sanitizer. ? Change your dressings  as told by your health care provider. Wash the area around your incisions with a germ-killing (antiseptic) solution when you change your dressings. ? Leave stitches (sutures), skin glue, or adhesive strips in place. These skin closures may need to stay in place for 2 weeks or longer. If adhesive strip edges start to loosen and curl up, you may trim the loose edges. Do not remove adhesive strips completely  unless your health care provider tells you to do that.  Keep your dressings clean and dry.  Check your incision areas every day for signs of infection. Check for: ? More redness, swelling, or pain. ? More fluid or blood. ? Warmth. ? Pus or a bad smell.   Catheter care  Wash your hands with soap and water before and after caring for your catheter. If soap and water are not available, use hand sanitizer.  Keep your catheter site clean and dry.  Apply an antibiotic ointment to your catheter site as told by your health care provider.  Flush your catheter as told by your health care provider. This helps prevent it from becoming clogged.  Do not open the caps on the ends of the catheter.  Do not pull on your catheter.   Medicines  Take over-the-counter and prescription medicines only as told by your health care provider.  If you were prescribed an antibiotic medicine, take it as told by your health care provider. Do not stop taking the antibiotic even if you start to feel better. Activity  Return to your normal activities as told by your health care provider. Ask your health care provider what activities are safe for you.  Follow any other activity restrictions as instructed by your health care provider.  Do not lift anything that is heavier than 10 lb (4.5 kg), or the limit that you are told, until your health care provider says that it is safe. Driving  Do not drive until your health care provider approves.  Ask your health care provider if the medicine prescribed to you requires you to avoid driving or using heavy machinery. General instructions  Follow your health care provider's specific instructions for the type of catheter that you have.  Do not take baths, swim, or use a hot tub until your health care provider approves. Ask your health care provider if you may take showers.  Keep all follow-up visits as told by your health care provider. This is important. Contact a  health care provider if:  You feel unusually weak or nauseous.  You have more redness, swelling, or pain at your incisions or around the area where your catheter is inserted.  Your catheter is not working properly.  You are unable to flush your catheter. Get help right away if:  Your catheter develops a hole or it breaks.  You have pain or swelling when fluids or medicines are being given through the catheter.  Fluid is leaking from the catheter, under the dressing, or around the dressing.  Your catheter comes loose or gets pulled completely out. If this happens, press on your catheter site firmly with a clean cloth until you can get medical help.  You have swelling in your shoulder, neck, chest, or face.  You have chest pain or difficulty breathing.  You feel dizzy or light-headed.  You have pus or a bad smell coming from your catheter site.  You have a fever or chills.  Your catheter site feels warm to the touch.  You develop bleeding from your catheter  or your insertion site, and your bleeding does not stop. Summary  After the procedure, it is common to have mild redness, swelling, and pain around your catheter site.  Return to your normal activities as told by your health care provider. Ask your health care provider what activities are safe for you.  Follow your health care provider's specific instructions for the type of catheter that you have.  Keep your catheter site and your dressings clean and dry.  Contact a health care provider if your catheter is not working properly. Get help right away if you have chest pain, fever, or difficulty breathing. This information is not intended to replace advice given to you by your health care provider. Make sure you discuss any questions you have with your health care provider. Document Revised: 09/14/2018 Document Reviewed: 09/14/2018 Elsevier Patient Education  2021 [employer].

## 2020-12-13 NOTE — Procedures (Signed)
Interventional Radiology Procedure Note  Procedure: RT IJ POWER PORT    Complications: None  Estimated Blood Loss:  MIN  Findings: TIP SVCRA    M. TREVOR Sam Overbeck, MD    

## 2020-12-13 NOTE — Progress Notes (Signed)
Patient clinically stable post Port placement per DR Annamaria Boots, tolerated well. Received Versed 2 mg along with Fentanyl 100 mcg IV for procedure. Awake/alert and oriented post procedure. Husband to bedside with discharge instructions given. Denies complaints at this time.

## 2020-12-13 NOTE — Consult Note (Signed)
Chief Complaint:  Lung cancer, active chemotherapy, evidence of metastatic disease by PET image  Referring Physician(s): Brahmanday,Govinda R   History of Present Illness: Donna Brennan is a 75 y.o. female with metastatic lung cancer.  Here today for portacatheter insertion to begin chemotherapy.  Overall she is doing well.  No recent illness or fevers.  Past Medical History:  Diagnosis Date  . CKD (chronic kidney disease), stage IV ([place])   . Colon polyps   . COPD (chronic obstructive pulmonary disease) ([place])   . Diabetes mellitus without complication ([place])   . GERD (gastroesophageal reflux disease)   . Headache   . Hepatitis    when in 4th or 5th grade.  Resolved  . Hyperlipidemia   . IBS (irritable bowel syndrome)   . Peptic ulcer   . Tobacco abuse   . Vertigo    No "bad" episodes in over 1 year  . Wears dentures    partial ujpper and lower    Past Surgical History:  Procedure Laterality Date  . ABDOMINAL HYSTERECTOMY    . carbuncle removal     urethra  . CATARACT EXTRACTION W/PHACO Left 10/29/2020   Procedure: CATARACT EXTRACTION PHACO AND INTRAOCULAR LENS PLACEMENT (I[facility]) LEFT DIABETIC 1051 01:00.0 ;  Surgeon: Eulogio Bear, MD;  Location: [facility];  Service: Ophthalmology;  Laterality: Left;  Diabetic - insulin  . CATARACT EXTRACTION W/PHACO Right 11/19/2020   Procedure: CATARACT EXTRACTION PHACO AND INTRAOCULAR LENS PLACEMENT ([place]) RIGHT DIABETIC;  Surgeon: Eulogio Bear, MD;  Location: [facility];  Service: Ophthalmology;  Laterality: Right;  16.54 1:40.0  . COLONOSCOPY    . COLONOSCOPY WITH PROPOFOL N/A 11/05/2017   Procedure: COLONOSCOPY WITH PROPOFOL;  Surgeon: Lollie Sails, MD;  Location: [facility] ENDOSCOPY;  Service: Endoscopy;  Laterality: N/A;  . ESOPHAGOGASTRODUODENOSCOPY    . ESOPHAGOGASTRODUODENOSCOPY (EGD) WITH PROPOFOL N/A 11/05/2017   Procedure: ESOPHAGOGASTRODUODENOSCOPY (EGD) WITH PROPOFOL;  Surgeon: Lollie Sails, MD;  Location: [facility] ENDOSCOPY;  Service: Endoscopy;  Laterality: N/A;  . VIDEO BRONCHOSCOPY WITH ENDOBRONCHIAL NAVIGATION N/A 12/02/2019   Procedure: VIDEO BRONCHOSCOPY WITH ENDOBRONCHIAL NAVIGATION;  Surgeon: Ottie Glazier, MD;  Location: [facility] ORS;  Service: Thoracic;  Laterality: N/A;  . VIDEO BRONCHOSCOPY WITH ENDOBRONCHIAL ULTRASOUND N/A 12/02/2019   Procedure: VIDEO BRONCHOSCOPY WITH ENDOBRONCHIAL ULTRASOUND;  Surgeon: Ottie Glazier, MD;  Location: [facility] ORS;  Service: Thoracic;  Laterality: N/A;    Allergies: Penicillins, Metformin, and Sulfa antibiotics  Medications: Prior to Admission medications   Medication Sig Start Date End Date Taking? Authorizing Provider  acetaminophen (TYLENOL) 500 MG tablet Take 1,000 mg by mouth every 6 (six) hours as needed (for pain.).    Yes [provider]  chlorpheniramine-HYDROcodone (TUSSIONEX) 10-8 MG/5ML SUER Take 5 mLs by mouth at bedtime as needed for cough. 06/07/20  Yes Burns, Wandra Feinstein, NP  Fluticasone-Salmeterol (ADVAIR) 250-50 MCG/DOSE AEPB Inhale 1 puff into the lungs 2 (two) times daily.   Yes [provider]  hyoscyamine (LEVBID) 0.375 MG 12 hr tablet Take 0.375 mg by mouth daily as needed (abdominal cramping).  03/24/19  Yes [provider]  Ipratropium-Albuterol (COMBIVENT) 20-100 MCG/ACT AERS respimat Inhale 1 puff into the lungs every 4 (four) hours as needed for wheezing.   Yes [provider]  LANTUS SOLOSTAR 100 UNIT/ML Solostar Pen Inject 12-20 Units into the skin 2 (two) times daily. 12U in AM, 20U in PM 10/25/19  Yes [provider]  Multiple Vitamin (MULTIVITAMIN WITH  MINERALS) TABS tablet Take 1 tablet by mouth daily. One-A-Day for Women   Yes [provider]  omeprazole (PRILOSEC) 20 MG capsule Take 20 mg by mouth daily before breakfast.    Yes [provider]  albuterol (VENTOLIN HFA) 108 (90 Base) MCG/ACT inhaler Inhale 1-2 puffs into the lungs every 6 (six)  hours as needed for wheezing or shortness of breath. 02/10/20   Cammie Sickle, MD  benzonatate (TESSALON) 200 MG capsule Take 200 mg by mouth 3 (three) times daily as needed for cough. Patient not taking: Reported on 12/13/2020    [provider]  dexamethasone (DECADRON) 4 MG tablet Take one pill AM & PM for 2 days. TAKE day prior & day AFTER chemo. Do NOT take on day of chemo. 12/05/20   Cammie Sickle, MD  diazepam (VALIUM) 5 MG tablet Take 5 mg by mouth daily as needed (for vertigo). Patient not taking: Reported on 12/13/2020    [provider]  ezetimibe (ZETIA) 10 MG tablet Take 10 mg by mouth daily.     [provider]  furosemide (LASIX) 20 MG tablet Take 20 mg by mouth daily as needed (fluid retention.). Patient not taking: Reported on 12/13/2020    [provider]  lidocaine-prilocaine (EMLA) cream Apply 1 application topically as needed. 12/05/20   Cammie Sickle, MD  nicotine polacrilex (NICORETTE) 4 MG gum Take 4 mg by mouth in the morning, at noon, and at bedtime. Patient not taking: Reported on 12/13/2020 11/15/19   [provider]  nystatin (MYCOSTATIN) 100000 UNIT/ML suspension Take 5 mLs by mouth 2 (two) times daily as needed (throat irritation.). Patient not taking: Reported on 12/13/2020    [provider]  ondansetron (ZOFRAN) 8 MG tablet Take 1 tablet (8 mg total) by mouth every 8 (eight) hours as needed for nausea or vomiting. Patient not taking: Reported on 12/13/2020 12/05/20   Cammie Sickle, MD  predniSONE (DELTASONE) 10 MG tablet Take 1 tablet by mouth daily. Patient not taking: Reported on 12/13/2020 11/15/20   [provider]  prochlorperazine (COMPAZINE) 10 MG tablet Take 1 tablet (10 mg total) by mouth every 6 (six) hours as needed for nausea or vomiting. 12/05/20   Cammie Sickle, MD  rosuvastatin (CRESTOR) 40 MG tablet Take 40 mg by mouth every evening.     [provider]   vitamin B-12 (CYANOCOBALAMIN) 1000 MCG tablet Take 1,000 mcg by mouth daily. Patient not taking: Reported on 12/13/2020    [provider]     Family History  Problem Relation Age of Onset  . Breast cancer Neg Hx     Social History   Socioeconomic History  . Marital status: Married    Spouse name: wayne  . Number of children: 6  . Years of education: Not on file  . Highest education level: Not on file  Occupational History  . Not on file  Tobacco Use  . Smoking status: Current Every Day Smoker    Packs/day: 0.25    Years: 45.00    Pack years: 11.25    Types: Cigarettes    Last attempt to quit: 08/06/2020    Years since quitting: 0.3  . Smokeless tobacco: Never Used  Vaping Use  . Vaping Use: Never used  Substance and Sexual Activity  . Alcohol use: No  . Drug use: No  . Sexual activity: Not on file  Other Topics Concern  . 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 lives in [place].     Social Determinants of Health   Financial Resource Strain: Not on file  Food Insecurity: Not on file  Transportation Needs: Not on file  Physical Activity: Not on file  Stress: Not on file  Social Connections: Not on file    ECOG Status: 1 - Symptomatic but completely ambulatory  Review of Systems: A 12 point ROS discussed and pertinent positives are indicated in the HPI above.  All other systems are negative.  Review of Systems  Vital Signs: BP 107/64   Pulse 92   Temp 97.9 F (36.6 C) (Oral)   Resp 20   Ht 5' 1.5" (1.562 m)   Wt 61.1 kg   SpO2 97%   BMI 25.04 kg/m   Physical Exam Constitutional:      General: She is not in acute distress.    Appearance: She is not toxic-appearing.     Comments: Thin elderly female in no distress  Eyes:     Conjunctiva/sclera: Conjunctivae normal.  Cardiovascular:     Rate and Rhythm: Normal rate and regular rhythm.      Heart sounds: Normal heart sounds.  Pulmonary:     Effort: Pulmonary effort is normal.     Breath sounds: Normal breath sounds.  Abdominal:     General: Abdomen is flat. Bowel sounds are normal.  Skin:    General: Skin is warm and dry.     Coloration: Skin is not jaundiced.  Neurological:     General: No focal deficit present.  Psychiatric:        Mood and Affect: Mood normal.     Imaging: NM PET Image Restag (PS) Skull Base To Thigh  Result Date: 11/22/2020 CLINICAL DATA:  Subsequent treatment strategy for non-small cell left lower lobe lung cancer detected in 2020 status post chemotherapy and radiation therapy, with findings worrisome for progression of metastatic disease on recent chest CT. EXAM: NUCLEAR MEDICINE PET SKULL BASE TO THIGH TECHNIQUE: 7.0 mCi F-18 FDG was injected intravenously. Full-ring PET imaging was performed from the skull base to thigh after the radiotracer. CT data was obtained and used for attenuation correction and anatomic localization. Fasting blood glucose: 106 mg/dl COMPARISON:  11/06/2020 chest CT.  09/27/2019 PET-CT. FINDINGS: Mediastinal blood pool activity: SUV max 2.2 Liver activity: SUV max NA NECK: New hypermetabolic 0.8 cm low right level 4 neck lymph node with max SUV 3.8 (series 3/image 51) at the thoracic inlet. New hypermetabolic 0.8 cm low left level 4 neck lymph node with max SUV 5.1 (series 3/image 53) at the thoracic inlet. Incidental CT findings: none CHEST: New enlarged hypermetabolic right paratracheal lymph nodes, largest 1.3 cm with max SUV 6.6 (series 3/image 74). New enlarged hypermetabolic 1.1 cm high left mediastinal lymph node in the tracheoesophageal groove with max SUV 6.5 (series 3/image 56). No enlarged or hypermetabolic axillary or hilar lymph nodes. Indistinct low level activity associated with nodular septal thickening in the basilar left lower lobe with max SUV 2.3 (series 3/image 110). No residual hypermetabolism at the site of the  treated 1.0 cm central left lower lobe pulmonary nodule with max SUV 1.2 (series 3/image 89). Bandlike lingular consolidation with associated mild volume loss and distortion, compatible with evolving radiation change. Incidental CT findings: Coronary atherosclerosis. Atherosclerotic nonaneurysmal thoracic aorta. ABDOMEN/PELVIS: New hypermetabolic 4.2 x 4.0 cm peripheral right liver hypodense mass with max SUV 12.5 (series 3/image 120). No additional  hypermetabolic liver lesions. No abnormal hypermetabolic activity within the pancreas, adrenal glands, or spleen. No hypermetabolic lymph nodes in the abdomen or pelvis. Incidental CT findings: Simple left liver cysts, largest 3.7 cm. Atherosclerotic nonaneurysmal abdominal aorta. Hysterectomy. SKELETON: No focal hypermetabolic activity to suggest skeletal metastasis. Incidental CT findings: none IMPRESSION: 1. New hypermetabolic nodal metastases in the bilateral lower neck/thoracic inlet and bilateral mediastinum. 2. Indistinct low level activity associated with new nodular septal thickening in the basilar left lower lung lobe, compatible with lymphangitic carcinomatosis. 3. New solitary hypermetabolic 4.2 cm peripheral right liver metastasis. 4. No residual hypermetabolism at the site of the treated 1.0 cm central left lower lobe pulmonary nodule. Evolving radiation changes in the lingula. 5. Chronic findings include: Aortic Atherosclerosis (ICD10-I70.0). Coronary atherosclerosis. Electronically Signed   By: Ilona Sorrel M.D.   On: 11/22/2020 11:20   US BIOPSY (LIVER)  Result Date: 11/28/2020 INDICATION: 75 year old female possible metastatic disease to the liver versus primary liver lesion. She has been referred for biopsy EXAM: ULTRASOUND-GUIDED LIVER MASS BIOPSY MEDICATIONS: None. ANESTHESIA/SEDATION: Moderate (conscious) sedation was employed during this procedure. A total of Versed 1.5 mg and Fentanyl 100 mcg was administered intravenously. Moderate Sedation  Time: 15 minutes. The patient's level of consciousness and vital signs were monitored continuously by radiology nursing throughout the procedure under my direct supervision. FLUOROSCOPY TIME:  Ultrasound COMPLICATIONS: None PROCEDURE: Informed written consent was obtained from the patient after a thorough discussion of the procedural risks, benefits and alternatives. All questions were addressed. Maximal Sterile Barrier Technique was utilized including caps, mask, sterile gowns, sterile gloves, sterile drape, hand hygiene and skin antiseptic. A timeout was performed prior to the initiation of the procedure. Ultrasound survey of the right liver lobe performed with images stored and sent to PACs. The right lower thorax/right upper abdomen was prepped with chlorhexidine in a sterile fashion, and a sterile drape was applied covering the operative field. A sterile gown and sterile gloves were used for the procedure. Local anesthesia was provided with 1% Lidocaine. The patient was prepped and draped sterilely and the skin and subcutaneous tissues were generously infiltrated with 1% lidocaine. A 17 gauge introducer needle was then advanced under ultrasound guidance in an intercostal location into the right liver lobe, targeting the hypoechoic mass of the right liver. The stylet was removed, and multiple separate 18 gauge core biopsy were retrieved. Samples were placed into formalin for transportation to the lab. Gel-Foam pledgets were then infused with a small amount of saline for assistance with hemostasis. The needle was removed, and a final ultrasound image was performed. The patient tolerated the procedure well and remained hemodynamically stable throughout. No complications were encountered and no significant blood loss was encounter. IMPRESSION: Status post ultrasound-guided right liver mass biopsy. Signed, Dulcy Fanny. Dellia Nims, RPVI [facility] Radiology Electronically  Signed   By: Corrie Mckusick D.O.   On: 11/28/2020 11:24    Labs:  CBC: Recent Labs    11/09/20 1009 11/28/20 1008  WBC 6.5 7.6  HGB 12.9 12.1  HCT 37.9 36.0  PLT 287 310    COAGS: Recent Labs    11/28/20 1008  INR 1.0    BMP: Recent Labs    05/02/20 1303 11/09/20 1009  NA  --  136  K  --  4.2  CL  --  104  CO2  --  23  GLUCOSE  --  230*  BUN  --  13  CALCIUM  --  8.4*  CREATININE 1.10* 1.10*  GFRNONAA  --  52*    LIVER FUNCTION TESTS: Recent Labs    11/09/20 1009  BILITOT 0.4  AST 18  ALT 16  ALKPHOS 76  PROT 6.4*  ALBUMIN 2.6*    TUMOR MARKERS: No results for input(s): AFPTM, CEA, CA199, CHROMGRNA in the last 8760 hours.  Assessment and Plan:  Metastatic lung cancer, plan for port catheter today.  Consent obtained.  Risks and benefits of image guided port-a-catheter placement was discussed with the patient including, but not limited to bleeding, infection, pneumothorax, or fibrin sheath development and need for additional procedures.  All of the patient's questions were answered, patient is agreeable to proceed. Consent signed and in chart.    Thank you for this interesting consult.  I greatly enjoyed meeting XIMENA TODARO and look forward to participating in their care.  A copy of this report was sent to the requesting provider on this date.  Electronically Signed: Greggory Keen, MD 12/13/2020, 10:19 AM   I spent a total of  40 Minutes   in face to face in clinical consultation, greater than 50% of which was counseling/coordinating care for this patient with metastatic lung cancer.

## 2020-12-13 NOTE — Progress Notes (Signed)
Contacted patient prior to her apt tomorrow with Dr. Jacinto Reap. Patient had port a cath placed today.  Patient did take her dexamethasone as directed by Dr. B prior to her chemotherapy.  Patient will bring health care power of attorney/living will papers to her apt tomorrow.

## 2020-12-14 ENCOUNTER — Inpatient Hospital Stay: Payer: Medicare Other

## 2020-12-14 ENCOUNTER — Telehealth: Payer: Self-pay | Admitting: *Deleted

## 2020-12-14 ENCOUNTER — Telehealth: Payer: Self-pay | Admitting: Internal Medicine

## 2020-12-14 ENCOUNTER — Inpatient Hospital Stay (HOSPITAL_BASED_OUTPATIENT_CLINIC_OR_DEPARTMENT_OTHER): Payer: Medicare Other | Admitting: Internal Medicine

## 2020-12-14 VITALS — BP 123/78 | HR 90 | Temp 98.1°F | Resp 20 | Wt 136.0 lb

## 2020-12-14 DIAGNOSIS — C3432 Malignant neoplasm of lower lobe, left bronchus or lung: Secondary | ICD-10-CM

## 2020-12-14 DIAGNOSIS — Z5111 Encounter for antineoplastic chemotherapy: Secondary | ICD-10-CM | POA: Diagnosis not present

## 2020-12-14 LAB — CBC WITH DIFFERENTIAL/PLATELET
Abs Immature Granulocytes: 0.03 10*3/uL (ref 0.00–0.07)
Basophils Absolute: 0 10*3/uL (ref 0.0–0.1)
Basophils Relative: 0 %
Eosinophils Absolute: 0 10*3/uL (ref 0.0–0.5)
Eosinophils Relative: 0 %
HCT: 36.1 % (ref 36.0–46.0)
Hemoglobin: 12.1 g/dL (ref 12.0–15.0)
Immature Granulocytes: 0 %
Lymphocytes Relative: 7 %
Lymphs Abs: 0.6 10*3/uL — ABNORMAL LOW (ref 0.7–4.0)
MCH: 30.3 pg (ref 26.0–34.0)
MCHC: 33.5 g/dL (ref 30.0–36.0)
MCV: 90.3 fL (ref 80.0–100.0)
Monocytes Absolute: 0.4 10*3/uL (ref 0.1–1.0)
Monocytes Relative: 5 %
Neutro Abs: 7.4 10*3/uL (ref 1.7–7.7)
Neutrophils Relative %: 88 %
Platelets: 351 10*3/uL (ref 150–400)
RBC: 4 MIL/uL (ref 3.87–5.11)
RDW: 12.5 % (ref 11.5–15.5)
WBC: 8.5 10*3/uL (ref 4.0–10.5)
nRBC: 0 % (ref 0.0–0.2)

## 2020-12-14 LAB — COMPREHENSIVE METABOLIC PANEL
ALT: 16 U/L (ref 0–44)
AST: 17 U/L (ref 15–41)
Albumin: 2.7 g/dL — ABNORMAL LOW (ref 3.5–5.0)
Alkaline Phosphatase: 83 U/L (ref 38–126)
Anion gap: 10 (ref 5–15)
BUN: 19 mg/dL (ref 8–23)
CO2: 23 mmol/L (ref 22–32)
Calcium: 8.8 mg/dL — ABNORMAL LOW (ref 8.9–10.3)
Chloride: 104 mmol/L (ref 98–111)
Creatinine, Ser: 1.09 mg/dL — ABNORMAL HIGH (ref 0.44–1.00)
GFR, Estimated: 53 mL/min — ABNORMAL LOW (ref >60)
Glucose, Bld: 389 mg/dL — ABNORMAL HIGH (ref 70–99)
Potassium: 4.5 mmol/L (ref 3.5–5.1)
Sodium: 137 mmol/L (ref 135–145)
Total Bilirubin: 0.5 mg/dL (ref 0.3–1.2)
Total Protein: 6.8 g/dL (ref 6.5–8.1)

## 2020-12-14 MED ORDER — PALONOSETRON HCL INJECTION 0.25 MG/5ML
0.2500 mg | Freq: Once | INTRAVENOUS | Status: AC
  Administered 2020-12-14: 0.25 mg via INTRAVENOUS
  Filled 2020-12-14: qty 5

## 2020-12-14 MED ORDER — DEXAMETHASONE SODIUM PHOSPHATE 10 MG/ML IJ SOLN
4.0000 mg | Freq: Once | INTRAMUSCULAR | Status: AC
  Administered 2020-12-14: 4 mg via INTRAVENOUS
  Filled 2020-12-14: qty 1

## 2020-12-14 MED ORDER — HEPARIN SOD (PORK) LOCK FLUSH 100 UNIT/ML IV SOLN
INTRAVENOUS | Status: AC
  Filled 2020-12-14: qty 5

## 2020-12-14 MED ORDER — CARBOPLATIN CHEMO INJECTION 450 MG/45ML
335.5000 mg | Freq: Once | INTRAVENOUS | Status: AC
  Administered 2020-12-14: 340 mg via INTRAVENOUS
  Filled 2020-12-14: qty 34

## 2020-12-14 MED ORDER — FOLIC ACID 1 MG PO TABS: 1.0000 mg | ORAL_TABLET | Freq: Every day | ORAL | 1 refills | Status: AC

## 2020-12-14 MED ORDER — CYANOCOBALAMIN 1000 MCG/ML IJ SOLN
1000.0000 ug | Freq: Once | INTRAMUSCULAR | Status: AC
  Administered 2020-12-14: 1000 ug via INTRAMUSCULAR
  Filled 2020-12-14: qty 1

## 2020-12-14 MED ORDER — HEPARIN SOD (PORK) LOCK FLUSH 100 UNIT/ML IV SOLN
500.0000 [IU] | Freq: Once | INTRAVENOUS | Status: AC | PRN
  Administered 2020-12-14: 500 [IU]
  Filled 2020-12-14: qty 5

## 2020-12-14 MED ORDER — SODIUM CHLORIDE 0.9 % IV SOLN
Freq: Once | INTRAVENOUS | Status: AC
Start: 2020-12-14 — End: 2020-12-14
  Filled 2020-12-14: qty 250

## 2020-12-14 MED ORDER — SODIUM CHLORIDE 0.9 % IV SOLN: 4.0000 mg | Freq: Once | INTRAVENOUS | Status: DC

## 2020-12-14 MED ORDER — SODIUM CHLORIDE 0.9 % IV SOLN
150.0000 mg | Freq: Once | INTRAVENOUS | Status: AC
  Administered 2020-12-14: 150 mg via INTRAVENOUS
  Filled 2020-12-14: qty 5

## 2020-12-14 MED ORDER — SODIUM CHLORIDE 0.9 % IV SOLN
500.0000 mg/m2 | Freq: Once | INTRAVENOUS | Status: AC
  Administered 2020-12-14: 800 mg via INTRAVENOUS
  Filled 2020-12-14: qty 20

## 2020-12-14 NOTE — Telephone Encounter (Signed)
On 3/11-I spoke with patient's daughter Donna Brennan regarding elevated blood sugars of 389 this morning.  Likely from underlying diabetes and also secondary to dexamethasone premedication.  Recommend patient not take dexamethasone post chemotherapy on 3/12.  Recommend rechecking blood sugars closely if elevated 350-400 consistently recommend reaching out to PCP for adjustment of insulin.  Recommend tonight take 15 units of Lantus at night [as per daughter, pt currently taking 27 units in the morning and 12 units at night.].  The daughter expressed understanding.

## 2020-12-14 NOTE — Progress Notes (Signed)
[place] NOTE  Patient Care Team: Adin Hector, MD as PCP - General (Internal Medicine) Telford Nab, RN as Oncology Nurse Navigator Erby Pian, MD as Referring Physician (Specialist) Cammie Sickle, MD as Consulting Physician (Internal Medicine) Noreene Filbert, MD as Referring Physician (Radiation Oncology)  CHIEF COMPLAINTS/PURPOSE OF CONSULTATION: Lung cancer  #  Oncology History Overview Note  #FEB 2021- LUNG, LEFT LOWER LOBE; ENB BRONCHOALVEOLAR LAVAGE:  - SUSPICIOUS FOR MALIGNANCY. 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   # NGS/MOLECULAR TESTS: MARCH 2022- PDL-1 TPS=0%    # PALLIATIVE CARE EVALUATION:  # PAIN MANAGEMENT:    DIAGNOSIS:   STAGE:         ;  GOALS:  CURRENT/MOST RECENT THERAPY :     Primary cancer of left lower lobe of lung ([place])  12/21/2019 Initial Diagnosis   Primary cancer of left lower lobe of lung ([place])   12/04/2020 Cancer Staging   Staging form: Lung, AJCC 8th Edition - Clinical: Stage IVA (cTX, cN2, pM1b) - Signed by Cammie Sickle, MD on 12/04/2020 Histopathologic type: Adenocarcinoma, NOS   12/14/2020 -  Chemotherapy    Patient is on Treatment Plan: LUNG NSCLC PEMETREXED (ALIMTA) / CARBOPLATIN Q21D X 1 CYCLES        HISTORY OF PRESENTING ILLNESS:  Donna Brennan 75 y.o.  female prior history of lung cancer; and adenocarcinoma-s/p liver biopsy likely lung origin is here to proceed with chemotherapy.  In the interim patient underwent a port placement.  No complications.  Patient continues to have mild shortness of breath on exertion.  Otherwise denies any headaches.  Denies any nausea vomiting.  No hemoptysis.   Review of Systems  Constitutional: Positive for malaise/fatigue.  Negative for chills, diaphoresis, fever and weight loss.  HENT: Negative for nosebleeds and sore throat.   Eyes: Negative for double vision.  Respiratory: Positive for cough and shortness of breath. Negative for hemoptysis, sputum production and wheezing.   Cardiovascular: Negative for chest pain, palpitations, orthopnea and leg swelling.  Gastrointestinal: Negative for abdominal pain, blood in stool, constipation, diarrhea, heartburn, melena, nausea and vomiting.  Genitourinary: Negative for dysuria, frequency and urgency.  Musculoskeletal: Positive for back pain and joint pain.  Skin: Negative.  Negative for itching and rash.  Neurological: Negative for dizziness, tingling, focal weakness, weakness and headaches.  Endo/Heme/Allergies: Does not bruise/bleed easily.  Psychiatric/Behavioral: Negative for depression. The patient is not nervous/anxious and does not have insomnia.      MEDICAL HISTORY:  Past Medical History:  Diagnosis Date  . CKD (chronic kidney disease), stage IV ([place])   . Colon polyps   . COPD (chronic obstructive pulmonary disease) ([place])   . Diabetes mellitus without complication ([place])   . GERD (gastroesophageal reflux disease)   . Headache   . Hepatitis    when in 4th or 5th grade.  Resolved  . Hyperlipidemia   . IBS (irritable bowel syndrome)   . Peptic ulcer   . Tobacco abuse   . Vertigo    No "bad" episodes in over 1 year  . Wears dentures    partial ujpper and lower    SURGICAL HISTORY: Past Surgical History:  Procedure Laterality Date  . ABDOMINAL HYSTERECTOMY    . carbuncle removal     urethra  . CATARACT EXTRACTION W/PHACO Left 10/29/2020  Procedure: CATARACT EXTRACTION PHACO AND INTRAOCULAR LENS PLACEMENT (IOC) LEFT DIABETIC 1051 01:00.0 ;  Surgeon: Eulogio Bear, MD;  Location: [facility];  Service: Ophthalmology;  Laterality: Left;  Diabetic - insulin  . CATARACT EXTRACTION W/PHACO Right 11/19/2020   Procedure: CATARACT EXTRACTION  PHACO AND INTRAOCULAR LENS PLACEMENT ([place]) RIGHT DIABETIC;  Surgeon: Eulogio Bear, MD;  Location: [facility];  Service: Ophthalmology;  Laterality: Right;  16.54 1:40.0  . COLONOSCOPY    . COLONOSCOPY WITH PROPOFOL N/A 11/05/2017   Procedure: COLONOSCOPY WITH PROPOFOL;  Surgeon: Lollie Sails, MD;  [facility] END[facility]COPY;  Service: Endoscopy;  Laterality: N/A;  . ESOPHAGOGASTRODUODENOSCOPY    . ESOPHAGOGASTRODUODENOSCOPY (EGD) WITH PROPOFOL N/A 11/05/2017   Procedure: ESOPHAGOGASTRODUODENOSCOPY (EGD) WITH PROPOFOL;  Surgeon: Lollie Sails, MD;  Location[facility] ENDOSCOPY;  Service: Endoscopy;  Laterality: N/A;  . IR IMAGING GUIDED PORT INSERTION  12/13/2020  . VIDEO BRONCHOSCOPY WITH ENDOBRONCHIAL NAVIGATION N/A 12/02/2019   Procedure: VIDEO BRONCHOSCOPY WITH ENDOBRONCHIAL NAVIGATION;  Surgeon: Ottie Glazier, MD;  Location: [facility] ORS;  Service: Thoracic;  Laterality: N/A;  . VIDEO BRONCHOSCOPY WITH ENDOBRONCHIAL ULTRASOUND N/A 12/02/2019   Procedure: VIDEO BRONCHOSCOPY WITH ENDOBRONCHIAL ULTRASOUND;  Surgeon: Ottie Glazier, MD;  Location: [facility] ORS;  Service: Thoracic;  Laterality: N/A;    SOCIAL HISTORY: Social History   Socioeconomic History  . Marital status: Married    Spouse name: wayne  . Number of children: 6  . Years of education: Not on file  . Highest education level: Not on file  Occupational History  . Not on file  Tobacco Use  . Smoking status: Current Every Day Smoker    Packs/day: 0.25    Years: 45.00    Pack years: 11.25    Types: Cigarettes    Last attempt to quit: 08/06/2020    Years since quitting: 0.3  . Smokeless tobacco: Never Used  Vaping Use  . Vaping Use: Never used  Substance and Sexual Activity  . Alcohol use: No  . Drug use: No  . Sexual activity: Not on file  Other Topics Concern  . 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 [place]  in [place].     Social Determinants of Health   Financial Resource Strain: Not on file  Food Insecurity: Not on file  Transportation Needs: Not on file  Physical Activity: Not on file  Stress: Not on file  Social Connections: Not on file  Intimate Partner Violence: Not on file    FAMILY HISTORY: Family History  Problem Relation Age of Onset  . Breast cancer Neg Hx     ALLERGIES:  is allergic to penicillins, metformin, and sulfa antibiotics.  MEDICATIONS:  Current Outpatient Medications  Medication Sig Dispense Refill  . albuterol (VENTOLIN HFA) 108 (90 Base) MCG/ACT inhaler Inhale 1-2 puffs into the lungs every 6 (six) hours as needed for wheezing or shortness of breath. 8 g 1  . chlorpheniramine-HYDROcodone (TUSSIONEX) 10-8 MG/5ML SUER Take 5 mLs by mouth at bedtime as needed for cough. 140 mL 0  . dexamethasone (DECADRON) 4 MG tablet Take one pill AM & PM for 2 days. TAKE day prior & day AFTER chemo. Do NOT take on day of chemo. 60 tablet 0  . ezetimibe (ZETIA) 10 MG tablet Take 10 mg by mouth daily.     . Fluticasone-Salmeterol (ADVAIR) 250-50 MCG/DOSE AEPB Inhale 1 puff into the lungs 2 (two) times  daily.    . folic acid (FOLVITE) 1 MG tablet Take 1 tablet (1 mg total) by mouth daily. 90 tablet 1  . hyoscyamine (LEVBID) 0.375 MG 12 hr tablet Take 0.375 mg by mouth daily as needed (abdominal cramping).     . Ipratropium-Albuterol (COMBIVENT) 20-100 MCG/ACT AERS respimat Inhale 1 puff into the lungs every 4 (four) hours as needed for wheezing.    . lidocaine-prilocaine (EMLA) cream Apply 1 application topically as needed. 30 g 3  . Multiple Vitamin (MULTIVITAMIN WITH MINERALS) TABS tablet Take 1 tablet by mouth daily. One-A-Day for Women    . omeprazole (PRILOSEC) 20 MG capsule Take 20 mg by mouth daily before breakfast.     . rosuvastatin (CRESTOR) 40 MG tablet Take 40 mg by mouth every evening.     Marland Kitchen acetaminophen (TYLENOL) 500 MG tablet Take 1,000 mg by mouth every 6 (six)  hours as needed (for pain.).  (Patient not taking: Reported on 12/13/2020)    . diazepam (VALIUM) 5 MG tablet Take 5 mg by mouth daily as needed (for vertigo). (Patient not taking: No sig reported)    . furosemide (LASIX) 20 MG tablet Take 20 mg by mouth daily as needed (fluid retention.). (Patient not taking: No sig reported)    . LANTUS SOLOSTAR 100 UNIT/ML Solostar Pen Inject 12-20 Units into the skin 2 (two) times daily. 12U in AM, 20U in PM    . nicotine polacrilex (NICORETTE) 4 MG gum Take 4 mg by mouth in the morning, at noon, and at bedtime. (Patient not taking: No sig reported)    . nystatin (MYCOSTATIN) 100000 UNIT/ML suspension Take 5 mLs by mouth 2 (two) times daily as needed (throat irritation.). (Patient not taking: No sig reported)    . ondansetron (ZOFRAN) 8 MG tablet Take 1 tablet (8 mg total) by mouth every 8 (eight) hours as needed for nausea or vomiting. (Patient not taking: No sig reported) 20 tablet 3  . prochlorperazine (COMPAZINE) 10 MG tablet Take 1 tablet (10 mg total) by mouth every 6 (six) hours as needed for nausea or vomiting. (Patient not taking: Reported on 12/13/2020) 30 tablet 3  . vitamin B-12 (CYANOCOBALAMIN) 1000 MCG tablet Take 1,000 mcg by mouth daily. (Patient not taking: No sig reported)     No current facility-administered medications for this visit.      Marland Kitchen  PHYSICAL EXAMINATION: ECOG PERFORMANCE STATUS: 0 - Asymptomatic  Vitals:   12/14/20 0900  BP: 123/78  Pulse: 90  Resp: 20  Temp: 98.1 F (36.7 C)  SpO2: 100%   Filed Weights   12/14/20 0900  Weight: 136 lb (61.7 kg)    Physical Exam Constitutional:      Comments: Thin built Caucasian female patient.  She is ambulating independently.  Accompanied by daughter.  HENT:     Head: Normocephalic and atraumatic.     Mouth/Throat:     Pharynx: No oropharyngeal exudate.  Eyes:     Pupils: Pupils are equal, round, and reactive to light.  Cardiovascular:     Rate and Rhythm: Normal rate and  regular rhythm.  Pulmonary:     Effort: No respiratory distress.     Breath sounds: No wheezing.     Comments: Decreased air entry bilaterally.  No wheeze or crackles. Abdominal:     General: Bowel sounds are normal. There is no distension.     Palpations: Abdomen is soft. There is no mass.     Tenderness: There is no abdominal tenderness.  There is no guarding or rebound.  Musculoskeletal:        General: No tenderness. Normal range of motion.     Cervical back: Normal range of motion and neck supple.  Skin:    General: Skin is warm.  Neurological:     Mental Status: She is alert and oriented to person, place, and time.  Psychiatric:        Mood and Affect: Affect normal.    LABORATORY DATA:  I have reviewed the data as listed Lab Results  Component Value Date   WBC 8.5 12/14/2020   HGB 12.1 12/14/2020   HCT 36.1 12/14/2020   MCV 90.3 12/14/2020   PLT 351 12/14/2020   Recent Labs    05/02/20 1303 11/09/20 1009 12/14/20 0841  NA  --  136 137  K  --  4.2 4.5  CL  --  104 104  CO2  --  23 23  GLUCOSE  --  230* 389*  BUN  --  13 19  CREATININE 1.10* 1.10* 1.09*  CALCIUM  --  8.4* 8.8*  GFRNONAA  --  52* 53*  PROT  --  6.4* 6.8  ALBUMIN  --  2.6* 2.7*  AST  --  18 17  ALT  --  16 16  ALKPHOS  --  76 83  BILITOT  --  0.4 0.5    RADIOGRAPHIC STUDIES: I have personally reviewed the radiological images as listed and agreed with the findings in the report. NM PET Image Restag (PS) Skull Base To Thigh  Result Date: 11/22/2020 CLINICAL DATA:  Subsequent treatment strategy for non-small cell left lower lobe lung cancer detected in 2020 status post chemotherapy and radiation therapy, with findings worrisome for progression of metastatic disease on recent chest CT. EXAM: NUCLEAR MEDICINE PET SKULL BASE TO THIGH TECHNIQUE: 7.0 mCi F-18 FDG was injected intravenously. Full-ring PET imaging was performed from the skull base to thigh after the radiotracer. CT data was obtained  and used for attenuation correction and anatomic localization. Fasting blood glucose: 106 mg/dl COMPARISON:  11/06/2020 chest CT.  09/27/2019 PET-CT. FINDINGS: Mediastinal blood pool activity: SUV max 2.2 Liver activity: SUV max NA NECK: New hypermetabolic 0.8 cm low right level 4 neck lymph node with max SUV 3.8 (series 3/image 51) at the thoracic inlet. New hypermetabolic 0.8 cm low left level 4 neck lymph node with max SUV 5.1 (series 3/image 53) at the thoracic inlet. Incidental CT findings: none CHEST: New enlarged hypermetabolic right paratracheal lymph nodes, largest 1.3 cm with max SUV 6.6 (series 3/image 74). New enlarged hypermetabolic 1.1 cm high left mediastinal lymph node in the tracheoesophageal groove with max SUV 6.5 (series 3/image 56). No enlarged or hypermetabolic axillary or hilar lymph nodes. Indistinct low level activity associated with nodular septal thickening in the basilar left lower lobe with max SUV 2.3 (series 3/image 110). No residual hypermetabolism at the site of the treated 1.0 cm central left lower lobe pulmonary nodule with max SUV 1.2 (series 3/image 89). Bandlike lingular consolidation with associated mild volume loss and distortion, compatible with evolving radiation change. Incidental CT findings: Coronary atherosclerosis. Atherosclerotic nonaneurysmal thoracic aorta. ABDOMEN/PELVIS: New hypermetabolic 4.2 x 4.0 cm peripheral right liver hypodense mass with max SUV 12.5 (series 3/image 120). No additional hypermetabolic liver lesions. No abnormal hypermetabolic activity within the pancreas, adrenal glands, or spleen. No hypermetabolic lymph nodes in the abdomen or pelvis. Incidental CT findings: Simple left liver cysts, largest 3.7 cm. Atherosclerotic nonaneurysmal abdominal aorta.  Hysterectomy. SKELETON: No focal hypermetabolic activity to suggest skeletal metastasis. Incidental CT findings: none IMPRESSION: 1. New hypermetabolic nodal metastases in the bilateral lower  neck/thoracic inlet and bilateral mediastinum. 2. Indistinct low level activity associated with new nodular septal thickening in the basilar left lower lung lobe, compatible with lymphangitic carcinomatosis. 3. New solitary hypermetabolic 4.2 cm peripheral right liver metastasis. 4. No residual hypermetabolism at the site of the treated 1.0 cm central left lower lobe pulmonary nodule. Evolving radiation changes in the lingula. 5. Chronic findings include: Aortic Atherosclerosis (ICD10-I70.0). Coronary atherosclerosis. Electronically Signed   By: Ilona Sorrel M.D.   On: 11/22/2020 11:20   US BIOPSY (LIVER)  Result Date: 11/28/2020 INDICATION: 75 year old female possible metastatic disease to the liver versus primary liver lesion. She has been referred for biopsy EXAM: ULTRASOUND-GUIDED LIVER MASS BIOPSY MEDICATIONS: None. ANESTHESIA/SEDATION: Moderate (conscious) sedation was employed during this procedure. A total of Versed 1.5 mg and Fentanyl 100 mcg was administered intravenously. Moderate Sedation Time: 15 minutes. The patient's level of consciousness and vital signs were monitored continuously by radiology nursing throughout the procedure under my direct supervision. FLUOROSCOPY TIME:  Ultrasound COMPLICATIONS: None PROCEDURE: Informed written consent was obtained from the patient after a thorough discussion of the procedural risks, benefits and alternatives. All questions were addressed. Maximal Sterile Barrier Technique was utilized including caps, mask, sterile gowns, sterile gloves, sterile drape, hand hygiene and skin antiseptic. A timeout was performed prior to the initiation of the procedure. Ultrasound survey of the right liver lobe performed with images stored and sent to PACs. The right lower thorax/right upper abdomen was prepped with chlorhexidine in a sterile fashion, and a sterile drape was applied covering the operative field. A sterile gown and sterile gloves were used for the procedure.  Local anesthesia was provided with 1% Lidocaine. The patient was prepped and draped sterilely and the skin and subcutaneous tissues were generously infiltrated with 1% lidocaine. A 17 gauge introducer needle was then advanced under ultrasound guidance in an intercostal location into the right liver lobe, targeting the hypoechoic mass of the right liver. The stylet was removed, and multiple separate 18 gauge core biopsy were retrieved. Samples were placed into formalin for transportation to the lab. Gel-Foam pledgets were then infused with a small amount of saline for assistance with hemostasis. The needle was removed, and a final ultrasound image was performed. The patient tolerated the procedure well and remained hemodynamically stable throughout. No complications were encountered and no significant blood loss was encounter. IMPRESSION: Status post ultrasound-guided right liver mass biopsy. Signed, Dulcy Fanny. Dellia Nims, RPVI [facility] Electronically Signed   By: Corrie Mckusick D.O.   On: 11/28/2020 11:24   IR IMAGING GUIDED PORT INSERTION  Result Date: 12/13/2020 CLINICAL DATA:  METASTATIC LUNG CANCER EXAM: RIGHT INTERNAL JUGULAR SINGLE LUMEN POWER PORT CATHETER INSERTION Date:  12/13/2020 12/13/2020 11:23 am Radiologist:  M. Daryll Brod, MD Guidance:  Ultrasound and fluoroscopic MEDICATIONS: 1% lidocaine local ANESTHESIA/SEDATION: Versed 2.0 mg IV; Fentanyl 50 mcg IV; Moderate Sedation Time:  30 minutes The patient was continuously monitored during the procedure by the interventional radiology nurse under my direct supervision. FLUOROSCOPY TIME:  0 minutes, 41 seconds (188 mGy) COMPLICATIONS: None immediate. CONTRAST:  None. PROCEDURE: Informed consent was obtained from the patient following explanation of the procedure, risks, benefits and alternatives. The patient understands, agrees and consents for the procedure. All questions were addressed. A time  out was performed. Maximal barrier sterile technique utilized  including caps, mask, sterile gowns, sterile gloves, large sterile drape, hand hygiene, and 2% chlorhexidine scrub. Under sterile conditions and local anesthesia, right internal jugular micropuncture venous access was performed. Access was performed with ultrasound. Images were obtained for documentation of the patent right internal jugular vein. A guide wire was inserted followed by a transitional dilator. This allowed insertion of a guide wire and catheter into the IVC. Measurements were obtained from the SVC / RA junction back to the right IJ venotomy site. In the right infraclavicular chest, a subcutaneous pocket was created over the second anterior rib. This was done under sterile conditions and local anesthesia. 1% lidocaine with epinephrine was utilized for this. A 2.5 cm incision was made in the skin. Blunt dissection was performed to create a subcutaneous pocket over the right pectoralis major muscle. The pocket was flushed with saline vigorously. There was adequate hemostasis. The port catheter was assembled and checked for leakage. The port catheter was secured in the pocket with two retention sutures. The tubing was tunneled subcutaneously to the right venotomy site and inserted into the SVC/RA junction through a valved peel-away sheath. Position was confirmed with fluoroscopy. Images were obtained for documentation. The patient tolerated the procedure well. No immediate complications. Incisions were closed in a two layer fashion with 4 - 0 Vicryl suture. Dermabond was applied to the skin. The port catheter was accessed, blood was aspirated followed by saline and heparin flushes. Needle was removed. A dry sterile dressing was applied. IMPRESSION: Ultrasound and fluoroscopically guided right internal jugular single lumen power port catheter insertion. Tip in the SVC/RA junction. Catheter ready for use. Electronically Signed   By: Jerilynn Mages.  Shick M.D.    On: 12/13/2020 11:30    ASSESSMENT & PLAN:   Primary cancer of left lower lobe of lung ([place]) # LUNG CA [likley]Liver biopsy positive for poorly differentiated adenocarcinoma-TTF-1 positive; however CK19 positive [question cholangio-].  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  Will need MRI brain.  Await NGS.  #Proceed with [place] Alimta; hold Keytruda awaiting NGS. Labs today reviewed;  acceptable for treatment today.    #Shortness of breath-chronic likely secondary COPD/follow-up with Dr. Dereck Leep.   # DISPOSITION:  # carbo-alimta chemo today # on 3/21- MD; labs- cbc/bmp; Possible IVFs #  Follow on April 4th MD; labs- cbc/cmp; carbo-alimta--Dr.B   All questions were answered. The patient knows to call the clinic with any problems, questions or concerns.    Cammie Sickle, MD 12/19/2020 9:44 PM

## 2020-12-14 NOTE — Progress Notes (Signed)
1000- Per MD, Dr. Rogue Bussing, order: proceed with scheduled Alimta and Carboplatin treatment today; also administer Vitamin B-12 injection today.

## 2020-12-14 NOTE — Telephone Encounter (Signed)
Returned daughter's phone call. Patient wanted to know what infusion she was sch for on 3/21. I explained to her that this was an apt for possible iv fluids. She gave verbal understanding of the plan of care.

## 2020-12-14 NOTE — Assessment & Plan Note (Addendum)
#   LUNG CA [likley]Liver biopsy positive for poorly differentiated adenocarcinoma-TTF-1 positive; however CK19 positive [question cholangio-].  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  Will need MRI brain.  Await NGS.  #Proceed with [place] Alimta; hold Keytruda awaiting NGS. Labs today reviewed;  acceptable for treatment today.    #Shortness of breath-chronic likely secondary COPD/follow-up with Dr. Dereck Leep.   # DISPOSITION:  # carbo-alimta chemo today # on 3/21- MD; labs- cbc/bmp; Possible IVFs #  Follow on April 4th MD; labs- cbc/cmp; carbo-alimta--Dr.B

## 2020-12-15 LAB — CEA: CEA: 5848 ng/mL — ABNORMAL HIGH (ref 0.0–4.7)

## 2020-12-17 ENCOUNTER — Telehealth: Payer: Self-pay

## 2020-12-17 ENCOUNTER — Encounter: Payer: Self-pay | Admitting: Internal Medicine

## 2020-12-17 NOTE — Telephone Encounter (Signed)
Telephone call to patient for follow up after receiving first infusion.   Patient states infusion went great.  States eating good and drinking plenty of fluids.   Denies any nausea or vomiting.  Encouraged patient to call for any concerns or questions. 

## 2020-12-18 LAB — CANCER ANTIGEN 19-9: CA 19-9: 5813 U/mL — ABNORMAL HIGH (ref 0–35)

## 2020-12-24 ENCOUNTER — Inpatient Hospital Stay: Payer: Medicare Other

## 2020-12-24 ENCOUNTER — Encounter: Payer: Self-pay | Admitting: Internal Medicine

## 2020-12-24 ENCOUNTER — Inpatient Hospital Stay (HOSPITAL_BASED_OUTPATIENT_CLINIC_OR_DEPARTMENT_OTHER): Payer: Medicare Other | Admitting: Internal Medicine

## 2020-12-24 ENCOUNTER — Other Ambulatory Visit: Payer: Self-pay

## 2020-12-24 ENCOUNTER — Telehealth: Payer: Self-pay | Admitting: Internal Medicine

## 2020-12-24 DIAGNOSIS — C3432 Malignant neoplasm of lower lobe, left bronchus or lung: Secondary | ICD-10-CM

## 2020-12-24 DIAGNOSIS — Z5111 Encounter for antineoplastic chemotherapy: Secondary | ICD-10-CM | POA: Diagnosis not present

## 2020-12-24 LAB — CBC WITH DIFFERENTIAL/PLATELET
Abs Immature Granulocytes: 0.01 10*3/uL (ref 0.00–0.07)
Basophils Absolute: 0 10*3/uL (ref 0.0–0.1)
Basophils Relative: 0 %
Eosinophils Absolute: 0 10*3/uL (ref 0.0–0.5)
Eosinophils Relative: 0 %
HCT: 31.8 % — ABNORMAL LOW (ref 36.0–46.0)
Hemoglobin: 10.5 g/dL — ABNORMAL LOW (ref 12.0–15.0)
Immature Granulocytes: 1 %
Lymphocytes Relative: 15 %
Lymphs Abs: 0.3 10*3/uL — ABNORMAL LOW (ref 0.7–4.0)
MCH: 29.8 pg (ref 26.0–34.0)
MCHC: 33 g/dL (ref 30.0–36.0)
MCV: 90.3 fL (ref 80.0–100.0)
Monocytes Absolute: 0.3 10*3/uL (ref 0.1–1.0)
Monocytes Relative: 16 %
Neutro Abs: 1.4 10*3/uL — ABNORMAL LOW (ref 1.7–7.7)
Neutrophils Relative %: 68 %
Platelets: 94 10*3/uL — ABNORMAL LOW (ref 150–400)
RBC: 3.52 MIL/uL — ABNORMAL LOW (ref 3.87–5.11)
RDW: 12.1 % (ref 11.5–15.5)
WBC: 2 10*3/uL — ABNORMAL LOW (ref 4.0–10.5)
nRBC: 0 % (ref 0.0–0.2)

## 2020-12-24 LAB — COMPREHENSIVE METABOLIC PANEL
ALT: 25 U/L (ref 0–44)
AST: 22 U/L (ref 15–41)
Albumin: 2.6 g/dL — ABNORMAL LOW (ref 3.5–5.0)
Alkaline Phosphatase: 66 U/L (ref 38–126)
Anion gap: 10 (ref 5–15)
BUN: 29 mg/dL — ABNORMAL HIGH (ref 8–23)
CO2: 21 mmol/L — ABNORMAL LOW (ref 22–32)
Calcium: 8.2 mg/dL — ABNORMAL LOW (ref 8.9–10.3)
Chloride: 103 mmol/L (ref 98–111)
Creatinine, Ser: 0.94 mg/dL (ref 0.44–1.00)
GFR, Estimated: >60 mL/min (ref >60)
Glucose, Bld: 297 mg/dL — ABNORMAL HIGH (ref 70–99)
Potassium: 4.4 mmol/L (ref 3.5–5.1)
Sodium: 134 mmol/L — ABNORMAL LOW (ref 135–145)
Total Bilirubin: 0.4 mg/dL (ref 0.3–1.2)
Total Protein: 6.6 g/dL (ref 6.5–8.1)

## 2020-12-24 MED ORDER — SODIUM CHLORIDE 0.9% FLUSH
10.0000 mL | INTRAVENOUS | Status: AC | PRN
  Administered 2020-12-24: 10 mL via INTRAVENOUS
  Filled 2020-12-24: qty 10

## 2020-12-24 MED ORDER — HEPARIN SOD (PORK) LOCK FLUSH 100 UNIT/ML IV SOLN
500.0000 [IU] | Freq: Once | INTRAVENOUS | Status: AC
  Administered 2020-12-24: 500 [IU] via INTRAVENOUS
  Filled 2020-12-24: qty 5

## 2020-12-24 NOTE — Telephone Encounter (Signed)
On 3/21-I spoke to patient's daughter regarding diagnosis of adenocarcinoma with discordant clinicopathologic features on biopsy-differential includes lung cancer versus cholangiocarcinoma.  Since treatments are different-I would recommend tissue of origin testing.  Discussed with Dr. Reuel Derby.   Heather- please order Tissue of origin testing on the liver biopsy specimen.   GB

## 2020-12-24 NOTE — Assessment & Plan Note (Addendum)
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  CEA elevated/CA 19-9 elevated [>5800]; NGS no targetable mutation; except TMB-H.   #Clinically suspicious for lung malignancy/recurrence; however difficult to explain elevated CEA/Ca-19-9/immunohistochemistry staining.  Will discuss with pathology.  If suggestive of pancreaticobiliary-would recommend upper lower GI.  Will discuss with daughter.  #Currently s/p [place] Alimta; await above discussed with pathology-carbo Abraxane would be an option.   #Shortness of breath-chronic likely secondary COPD/follow-up with Dr. Dereck Leep.   # DISPOSITION:  # NO IVfs; de-access #  As planned Follow on April 4th MD; labs- cbc/cmp; carbo-alimta-Keytruda--Dr.B

## 2020-12-24 NOTE — Progress Notes (Signed)
[place] NOTE  Patient Care Team: Adin Hector, MD as PCP - General (Internal Medicine) Telford Nab, RN as Oncology Nurse Navigator Erby Pian, MD as Referring Physician (Specialist) Cammie Sickle, MD as Consulting Physician (Internal Medicine) Noreene Filbert, MD as Referring Physician (Radiation Oncology)  CHIEF COMPLAINTS/PURPOSE OF CONSULTATION: Lung cancer  #  Oncology History Overview Note  #FEB 2021- LUNG, LEFT LOWER LOBE; ENB BRONCHOALVEOLAR LAVAGE:  - SUSPICIOUS FOR MALIGNANCY. 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   # NGS/MOLECULAR TESTS: MARCH 2022- PDL-1 TPS=0%    # PALLIATIVE CARE EVALUATION:  # PAIN MANAGEMENT:    DIAGNOSIS:   STAGE:         ;  GOALS:  CURRENT/MOST RECENT THERAPY :     Primary cancer of left lower lobe of lung ([place])  12/21/2019 Initial Diagnosis   Primary cancer of left lower lobe of lung ([place])   12/04/2020 Cancer Staging   Staging form: Lung, AJCC 8th Edition - Clinical: Stage IVA (cTX, cN2, pM1b) - Signed by Cammie Sickle, MD on 12/04/2020 Histopathologic type: Adenocarcinoma, NOS   12/14/2020 -  Chemotherapy    Patient is on Treatment Plan: LUNG NSCLC PEMETREXED (ALIMTA) / CARBOPLATIN Q21D X 1 CYCLES        HISTORY OF PRESENTING ILLNESS:  Donna Brennan 75 y.o.  female prior history of lung cancer; and adenocarcinoma-s/p liver biopsy-thought clinically lung cancer status post [place] Alimta is here for follow-up.  Patient complains of mild fatigue.  Otherwise denies any worsening pain.  No headaches.  No nausea no vomiting.  Complains of elevated blood sugar from steroids.   Review of Systems  Constitutional: Positive for malaise/fatigue. Negative for chills, diaphoresis,  fever and weight loss.  HENT: Negative for nosebleeds and sore throat.   Eyes: Negative for double vision.  Respiratory: Positive for cough and shortness of breath. Negative for hemoptysis, sputum production and wheezing.   Cardiovascular: Negative for chest pain, palpitations, orthopnea and leg swelling.  Gastrointestinal: Negative for abdominal pain, blood in stool, constipation, diarrhea, heartburn, melena, nausea and vomiting.  Genitourinary: Negative for dysuria, frequency and urgency.  Musculoskeletal: Positive for back pain and joint pain.  Skin: Negative.  Negative for itching and rash.  Neurological: Negative for dizziness, tingling, focal weakness, weakness and headaches.  Endo/Heme/Allergies: Does not bruise/bleed easily.  Psychiatric/Behavioral: Negative for depression. The patient is not nervous/anxious and does not have insomnia.      MEDICAL HISTORY:  Past Medical History:  Diagnosis Date  . CKD (chronic kidney disease), stage IV ([place])   . Colon polyps   . COPD (chronic obstructive pulmonary disease) ([place])   . Diabetes mellitus without complication ([place])   . GERD (gastroesophageal reflux disease)   . Headache   . Hepatitis    when in 4th or 5th grade.  Resolved  . Hyperlipidemia   . IBS (irritable bowel syndrome)   . Peptic ulcer   . Tobacco abuse   . Vertigo    No "bad" episodes in over 1 year  . Wears dentures    partial ujpper and lower    SURGICAL HISTORY: Past Surgical History:  Procedure Laterality Date  . ABDOMINAL HYSTERECTOMY    . carbuncle removal     urethra  . CATARACT EXTRACTION W/PHACO Left 10/29/2020   Procedure: CATARACT EXTRACTION PHACO  AND INTRAOCULAR LENS PLACEMENT (IOC) LEFT DIABETIC 1051 01:00.0 ;  Surgeon: Eulogio Bear, MD;  Location: [facility];  Service: Ophthalmology;  Laterality: Left;  Diabetic - insulin  . CATARACT EXTRACTION W/PHACO Right 11/19/2020   Procedure: CATARACT EXTRACTION PHACO AND INTRAOCULAR LENS  PLACEMENT ([place]) RIGHT DIABETIC;  Surgeon: Eulogio Bear, MD;  Location: [facility];  Service: Ophthalmology;  Laterality: Right;  16.54 1:40.0  . COLONOSCOPY    . COLONOSCOPY WITH PROPOFOL N/A 11/05/2017   Procedure: COLONOSCOPY WITH PROPOFOL;  Surgeon: Lollie Sails, MD;  Location[facility] ENDOSCOPY;  Service: Endoscopy;  Laterality: N/A;  . ESOPHAGOGASTRODUODENOSCOPY    . ESOPHAGOGASTRODUODENOSCOPY (EGD) WITH PROPOFOL N/A 11/05/2017   Procedure: ESOPHAGOGASTRODUODENOSCOPY (EGD) WITH PROPOFOL;  Surgeon: Lollie Sails, MD;  Location: [facility] ENDOSCOPY;  Service: Endoscopy;  Laterality: N/A;  . IR IMAGING GUIDED PORT INSERTION  12/13/2020  . VIDEO BRONCHOSCOPY WITH ENDOBRONCHIAL NAVIGATION N/A 12/02/2019   Procedure: VIDEO BRONCHOSCOPY WITH ENDOBRONCHIAL NAVIGATION;  Surgeon: Ottie Glazier, MD;  Location: [facility] ORS;  Service: Thoracic;  Laterality: N/A;  . VIDEO BRONCHOSCOPY WITH ENDOBRONCHIAL ULTRASOUND N/A 12/02/2019   Procedure: VIDEO BRONCHOSCOPY WITH ENDOBRONCHIAL ULTRASOUND;  Surgeon: Ottie Glazier, MD;  Location: [facility] ORS;  Service: Thoracic;  Laterality: N/A;    SOCIAL HISTORY: Social History   Socioeconomic History  . Marital status: Married    Spouse name: wayne  . Number of children: 6  . Years of education: Not on file  . Highest education level: Not on file  Occupational History  . Not on file  Tobacco Use  . Smoking status: Current Every Day Smoker    Packs/day: 0.25    Years: 45.00    Pack years: 11.25    Types: Cigarettes    Last attempt to quit: 08/06/2020    Years since quitting: 0.3  . Smokeless tobacco: Never Used  Vaping Use  . Vaping Use: Never used  Substance and Sexual Activity  . Alcohol use: No  . Drug use: No  . Sexual activity: Not on file  Other Topics Concern  . 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 [place].     Social  Determinants of Health   Financial Resource Strain: Not on file  Food Insecurity: Not on file  Transportation Needs: Not on file  Physical Activity: Not on file  Stress: Not on file  Social Connections: Not on file  Intimate Partner Violence: Not on file    FAMILY HISTORY: Family History  Problem Relation Age of Onset  . Breast cancer Neg Hx     ALLERGIES:  is allergic to penicillins, metformin, and sulfa antibiotics.  MEDICATIONS:  Current Outpatient Medications  Medication Sig Dispense Refill  . albuterol (VENTOLIN HFA) 108 (90 Base) MCG/ACT inhaler Inhale 1-2 puffs into the lungs every 6 (six) hours as needed for wheezing or shortness of breath. 8 g 1  . chlorpheniramine-HYDROcodone (TUSSIONEX) 10-8 MG/5ML SUER Take 5 mLs by mouth at bedtime as needed for cough. 140 mL 0  . dexamethasone (DECADRON) 4 MG tablet Take one pill AM & PM for 2 days. TAKE day prior & day AFTER chemo. Do NOT take on day of chemo. 60 tablet 0  . diazepam (VALIUM) 5 MG tablet Take 5 mg by mouth daily as needed (for vertigo).    . ezetimibe (ZETIA) 10 MG tablet Take 10 mg by mouth daily.     Marland Kitchen  Fluticasone-Salmeterol (ADVAIR) 250-50 MCG/DOSE AEPB Inhale 1 puff into the lungs 2 (two) times daily.    . folic acid (FOLVITE) 1 MG tablet Take 1 tablet (1 mg total) by mouth daily. 90 tablet 1  . furosemide (LASIX) 20 MG tablet Take 20 mg by mouth daily as needed (fluid retention.).    Marland Kitchen hyoscyamine (LEVBID) 0.375 MG 12 hr tablet Take 0.375 mg by mouth daily as needed (abdominal cramping).     . Ipratropium-Albuterol (COMBIVENT) 20-100 MCG/ACT AERS respimat Inhale 1 puff into the lungs every 4 (four) hours as needed for wheezing.    Marland Kitchen LANTUS SOLOSTAR 100 UNIT/ML Solostar Pen Inject 12-20 Units into the skin 2 (two) times daily. 12U in AM, 20U in PM    . lidocaine-prilocaine (EMLA) cream Apply 1 application topically as needed. 30 g 3  . Multiple Vitamin (MULTIVITAMIN WITH MINERALS) TABS tablet Take 1 tablet by  mouth daily. One-A-Day for Women    . nicotine polacrilex (NICORETTE) 4 MG gum Take 4 mg by mouth in the morning, at noon, and at bedtime.    Marland Kitchen nystatin (MYCOSTATIN) 100000 UNIT/ML suspension Take 5 mLs by mouth 2 (two) times daily as needed (throat irritation.).    Marland Kitchen omeprazole (PRILOSEC) 20 MG capsule Take 20 mg by mouth daily before breakfast.     . ondansetron (ZOFRAN) 8 MG tablet Take 1 tablet (8 mg total) by mouth every 8 (eight) hours as needed for nausea or vomiting. 20 tablet 3  . prochlorperazine (COMPAZINE) 10 MG tablet Take 1 tablet (10 mg total) by mouth every 6 (six) hours as needed for nausea or vomiting. 30 tablet 3  . rosuvastatin (CRESTOR) 40 MG tablet Take 40 mg by mouth every evening.     . vitamin B-12 (CYANOCOBALAMIN) 1000 MCG tablet Take 1,000 mcg by mouth daily.    Marland Kitchen acetaminophen (TYLENOL) 500 MG tablet Take 1,000 mg by mouth every 6 (six) hours as needed (for pain.).  (Patient not taking: No sig reported)     No current facility-administered medications for this visit.   Facility-Administered Medications Ordered in Other Visits  Medication Dose Route Frequency Provider Last Rate Last Admin  . sodium chloride flush (NS) 0.9 % injection 10 mL  10 mL Intravenous PRN Cammie Sickle, MD   10 mL at 12/24/20 0842      .  PHYSICAL EXAMINATION: ECOG PERFORMANCE STATUS: 0 - Asymptomatic  Vitals:   12/24/20 0914  BP: 137/68  Pulse: 79  Resp: 20  Temp: 97.9 F (36.6 C)   Filed Weights   12/24/20 0914  Weight: 134 lb 3.2 oz (60.9 kg)    Physical Exam Constitutional:      Comments: Thin built Caucasian female patient.  She is ambulating independently.  Accompanied by daughter.  HENT:     Head: Normocephalic and atraumatic.     Mouth/Throat:     Pharynx: No oropharyngeal exudate.  Eyes:     Pupils: Pupils are equal, round, and reactive to light.  Cardiovascular:     Rate and Rhythm: Normal rate and regular rhythm.  Pulmonary:     Effort: No  respiratory distress.     Breath sounds: No wheezing.     Comments: Decreased air entry bilaterally.  No wheeze or crackles. Abdominal:     General: Bowel sounds are normal. There is no distension.     Palpations: Abdomen is soft. There is no mass.     Tenderness: There is no abdominal tenderness. There is no  guarding or rebound.  Musculoskeletal:        General: No tenderness. Normal range of motion.     Cervical back: Normal range of motion and neck supple.  Skin:    General: Skin is warm.  Neurological:     Mental Status: She is alert and oriented to person, place, and time.  Psychiatric:        Mood and Affect: Affect normal.    LABORATORY DATA:  I have reviewed the data as listed Lab Results  Component Value Date   WBC 2.0 (L) 12/24/2020   HGB 10.5 (L) 12/24/2020   HCT 31.8 (L) 12/24/2020   MCV 90.3 12/24/2020   PLT 94 (L) 12/24/2020   Recent Labs    11/09/20 1009 12/14/20 0841 12/24/20 0842  NA 136 137 134*  K 4.2 4.5 4.4  CL 104 104 103  CO2 23 23 21*  GLUCOSE 230* 389* 297*  BUN 13 19 29*  CREATININE 1.10* 1.09* 0.94  CALCIUM 8.4* 8.8* 8.2*  GFRNONAA 52* 53* >60  PROT 6.4* 6.8 6.6  ALBUMIN 2.6* 2.7* 2.6*  AST _0 ALT _1 ALKPHOS 76 83 66  BILITOT 0.4 0.5 0.4    RADIOGRAPHIC STUDIES: I have personally reviewed the radiological images as listed and agreed with the findings in the report. US BIOPSY (LIVER)  Result Date: 11/28/2020 INDICATION: 75 year old female possible metastatic disease to the liver versus primary liver lesion. She has been referred for biopsy EXAM: ULTRASOUND-GUIDED LIVER MASS BIOPSY MEDICATIONS: None. ANESTHESIA/SEDATION: Moderate (conscious) sedation was employed during this procedure. A total of Versed 1.5 mg and Fentanyl 100 mcg was administered intravenously. Moderate Sedation Time: 15 minutes. The patient's level of consciousness and vital signs were monitored continuously by radiology nursing throughout the procedure  under my direct supervision. FLUOROSCOPY TIME:  Ultrasound COMPLICATIONS: None PROCEDURE: Informed written consent was obtained from the patient after a thorough discussion of the procedural risks, benefits and alternatives. All questions were addressed. Maximal Sterile Barrier Technique was utilized including caps, mask, sterile gowns, sterile gloves, sterile drape, hand hygiene and skin antiseptic. A timeout was performed prior to the initiation of the procedure. Ultrasound survey of the right liver lobe performed with images stored and sent to PACs. The right lower thorax/right upper abdomen was prepped with chlorhexidine in a sterile fashion, and a sterile drape was applied covering the operative field. A sterile gown and sterile gloves were used for the procedure. Local anesthesia was provided with 1% Lidocaine. The patient was prepped and draped sterilely and the skin and subcutaneous tissues were generously infiltrated with 1% lidocaine. A 17 gauge introducer needle was then advanced under ultrasound guidance in an intercostal location into the right liver lobe, targeting the hypoechoic mass of the right liver. The stylet was removed, and multiple separate 18 gauge core biopsy were retrieved. Samples were placed into formalin for transportation to the lab. Gel-Foam pledgets were then infused with a small amount of saline for assistance with hemostasis. The needle was removed, and a final ultrasound image was performed. The patient tolerated the procedure well and remained hemodynamically stable throughout. No complications were encountered and no significant blood loss was encounter. IMPRESSION: Status post ultrasound-guided right liver mass biopsy. Signed, Dulcy Fanny. Dellia Nims, RPVI [facility] Radiology Electronically Signed   By: Corrie Mckusick D.O.   On: 11/28/2020 11:24   IR IMAGING GUIDED PORT INSERTION  Result Date: 12/13/2020 CLINICAL DATA:   METASTATIC  LUNG CANCER EXAM: RIGHT INTERNAL JUGULAR SINGLE LUMEN POWER PORT CATHETER INSERTION Date:  12/13/2020 12/13/2020 11:23 am Radiologist:  M. Daryll Brod, MD Guidance:  Ultrasound and fluoroscopic MEDICATIONS: 1% lidocaine local ANESTHESIA/SEDATION: Versed 2.0 mg IV; Fentanyl 50 mcg IV; Moderate Sedation Time:  30 minutes The patient was continuously monitored during the procedure by the interventional radiology nurse under my direct supervision. FLUOROSCOPY TIME:  0 minutes, 41 seconds (937 mGy) COMPLICATIONS: None immediate. CONTRAST:  None. PROCEDURE: Informed consent was obtained from the patient following explanation of the procedure, risks, benefits and alternatives. The patient understands, agrees and consents for the procedure. All questions were addressed. A time out was performed. Maximal barrier sterile technique utilized including caps, mask, sterile gowns, sterile gloves, large sterile drape, hand hygiene, and 2% chlorhexidine scrub. Under sterile conditions and local anesthesia, right internal jugular micropuncture venous access was performed. Access was performed with ultrasound. Images were obtained for documentation of the patent right internal jugular vein. A guide wire was inserted followed by a transitional dilator. This allowed insertion of a guide wire and catheter into the IVC. Measurements were obtained from the SVC / RA junction back to the right IJ venotomy site. In the right infraclavicular chest, a subcutaneous pocket was created over the second anterior rib. This was done under sterile conditions and local anesthesia. 1% lidocaine with epinephrine was utilized for this. A 2.5 cm incision was made in the skin. Blunt dissection was performed to create a subcutaneous pocket over the right pectoralis major muscle. The pocket was flushed with saline vigorously. There was adequate hemostasis. The port catheter was assembled and checked for leakage. The port catheter was secured in the  pocket with two retention sutures. The tubing was tunneled subcutaneously to the right venotomy site and inserted into the SVC/RA junction through a valved peel-away sheath. Position was confirmed with fluoroscopy. Images were obtained for documentation. The patient tolerated the procedure well. No immediate complications. Incisions were closed in a two layer fashion with 4 - 0 Vicryl suture. Dermabond was applied to the skin. The port catheter was accessed, blood was aspirated followed by saline and heparin flushes. Needle was removed. A dry sterile dressing was applied. IMPRESSION: Ultrasound and fluoroscopically guided right internal jugular single lumen power port catheter insertion. Tip in the SVC/RA junction. Catheter ready for use. Electronically Signed   By: Jerilynn Mages.  Shick M.D.   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  CEA elevated/CA 19-9 elevated [>5800]; NGS no targetable mutation; except TMB-H.   #Clinically suspicious for lung malignancy/recurrence; however difficult to explain elevated CEA/Ca-19-9/immunohistochemistry staining.  Will discuss with pathology.  If suggestive of pancreaticobiliary-would recommend upper lower GI.  Will discuss with daughter.  #Currently s/p [place] Alimta; await above discussed with pathology-carbo Abraxane would be an option.   #Shortness of breath-chronic likely secondary COPD/follow-up with Dr. Dereck Leep.   # DISPOSITION:  # NO IVfs; de-access #  As planned Follow on April 4th MD; labs- cbc/cmp; carbo-alimta-Keytruda--Dr.B   All questions were answered. The patient knows to call the clinic with any problems, questions or concerns.    Cammie Sickle, MD 12/24/2020 2:48 PM

## 2020-12-26 NOTE — Telephone Encounter (Signed)
Cancer type id Test submitted

## 2020-12-27 ENCOUNTER — Encounter: Payer: Self-pay | Admitting: Internal Medicine

## 2020-12-28 ENCOUNTER — Inpatient Hospital Stay: Payer: Medicare Other

## 2020-12-28 ENCOUNTER — Ambulatory Visit
Admission: RE | Admit: 2020-12-28 | Discharge: 2020-12-28 | Disposition: A | Discharge status: Home / Self Care | Payer: Medicare Other | Attending: Nurse Practitioner | Admitting: Nurse Practitioner

## 2020-12-28 ENCOUNTER — Ambulatory Visit: Admission: RE | Admit: 2020-12-28 | Payer: Medicare Other | Source: Home / Self Care | Admitting: *Deleted

## 2020-12-28 ENCOUNTER — Inpatient Hospital Stay (HOSPITAL_BASED_OUTPATIENT_CLINIC_OR_DEPARTMENT_OTHER): Payer: Medicare Other | Admitting: Nurse Practitioner

## 2020-12-28 ENCOUNTER — Telehealth: Payer: Self-pay | Admitting: *Deleted

## 2020-12-28 ENCOUNTER — Ambulatory Visit
Admission: RE | Admit: 2020-12-28 | Discharge: 2020-12-28 | Disposition: A | Discharge status: Home / Self Care | Payer: Medicare Other | Source: Ambulatory Visit | Attending: Nurse Practitioner | Admitting: Nurse Practitioner

## 2020-12-28 ENCOUNTER — Encounter: Payer: Self-pay | Admitting: Nurse Practitioner

## 2020-12-28 ENCOUNTER — Other Ambulatory Visit: Payer: Self-pay | Admitting: *Deleted

## 2020-12-28 ENCOUNTER — Other Ambulatory Visit: Payer: Self-pay

## 2020-12-28 ENCOUNTER — Ambulatory Visit
Admission: RE | Admit: 2020-12-28 | Discharge: 2020-12-28 | Disposition: A | Discharge status: Home / Self Care | Payer: Medicare Other | Source: Ambulatory Visit | Attending: Internal Medicine | Admitting: Internal Medicine

## 2020-12-28 VITALS — BP 127/75 | HR 109 | Temp 98.7°F | Resp 20 | Wt 132.0 lb

## 2020-12-28 DIAGNOSIS — R059 Cough, unspecified: Secondary | ICD-10-CM

## 2020-12-28 DIAGNOSIS — C3432 Malignant neoplasm of lower lobe, left bronchus or lung: Secondary | ICD-10-CM | POA: Diagnosis present

## 2020-12-28 DIAGNOSIS — Z5111 Encounter for antineoplastic chemotherapy: Secondary | ICD-10-CM | POA: Diagnosis not present

## 2020-12-28 DIAGNOSIS — R062 Wheezing: Secondary | ICD-10-CM

## 2020-12-28 LAB — CBC WITH DIFFERENTIAL/PLATELET
Abs Immature Granulocytes: 0.02 10*3/uL (ref 0.00–0.07)
Basophils Absolute: 0 10*3/uL (ref 0.0–0.1)
Basophils Relative: 0 %
Eosinophils Absolute: 0.1 10*3/uL (ref 0.0–0.5)
Eosinophils Relative: 4 %
HCT: 33.1 % — ABNORMAL LOW (ref 36.0–46.0)
Hemoglobin: 10.9 g/dL — ABNORMAL LOW (ref 12.0–15.0)
Immature Granulocytes: 1 %
Lymphocytes Relative: 25 %
Lymphs Abs: 0.7 10*3/uL (ref 0.7–4.0)
MCH: 30.2 pg (ref 26.0–34.0)
MCHC: 32.9 g/dL (ref 30.0–36.0)
MCV: 91.7 fL (ref 80.0–100.0)
Monocytes Absolute: 0.7 10*3/uL (ref 0.1–1.0)
Monocytes Relative: 26 %
Neutro Abs: 1.3 10*3/uL — ABNORMAL LOW (ref 1.7–7.7)
Neutrophils Relative %: 44 %
Platelets: 160 10*3/uL (ref 150–400)
RBC: 3.61 MIL/uL — ABNORMAL LOW (ref 3.87–5.11)
RDW: 13 % (ref 11.5–15.5)
WBC: 2.9 10*3/uL — ABNORMAL LOW (ref 4.0–10.5)
nRBC: 0 % (ref 0.0–0.2)

## 2020-12-28 LAB — COMPREHENSIVE METABOLIC PANEL
ALT: 21 U/L (ref 0–44)
AST: 19 U/L (ref 15–41)
Albumin: 2.7 g/dL — ABNORMAL LOW (ref 3.5–5.0)
Alkaline Phosphatase: 78 U/L (ref 38–126)
Anion gap: 11 (ref 5–15)
BUN: 14 mg/dL (ref 8–23)
CO2: 21 mmol/L — ABNORMAL LOW (ref 22–32)
Calcium: 8.6 mg/dL — ABNORMAL LOW (ref 8.9–10.3)
Chloride: 101 mmol/L (ref 98–111)
Creatinine, Ser: 1.02 mg/dL — ABNORMAL HIGH (ref 0.44–1.00)
GFR, Estimated: 57 mL/min — ABNORMAL LOW (ref >60)
Glucose, Bld: 132 mg/dL — ABNORMAL HIGH (ref 70–99)
Potassium: 4.5 mmol/L (ref 3.5–5.1)
Sodium: 133 mmol/L — ABNORMAL LOW (ref 135–145)
Total Bilirubin: 0.8 mg/dL (ref 0.3–1.2)
Total Protein: 6.9 g/dL (ref 6.5–8.1)

## 2020-12-28 MED ORDER — IPRATROPIUM-ALBUTEROL 0.5-2.5 (3) MG/3ML IN SOLN: 3.0000 mL | Freq: Four times a day (QID) | RESPIRATORY_TRACT | 0 refills | Status: AC | PRN

## 2020-12-28 MED ORDER — HEPARIN SOD (PORK) LOCK FLUSH 100 UNIT/ML IV SOLN
500.0000 [IU] | Freq: Once | INTRAVENOUS | Status: AC
  Administered 2020-12-28: 500 [IU] via INTRAVENOUS
  Filled 2020-12-28: qty 5

## 2020-12-28 MED ORDER — SODIUM CHLORIDE 0.9% FLUSH
10.0000 mL | INTRAVENOUS | Status: AC | PRN
  Administered 2020-12-28: 10 mL via INTRAVENOUS
  Filled 2020-12-28: qty 10

## 2020-12-28 MED ORDER — LEVOFLOXACIN 500 MG PO TABS: 500.0000 mg | ORAL_TABLET | Freq: Every day | ORAL | 0 refills | Status: AC

## 2020-12-28 MED ORDER — GADOBUTROL 1 MMOL/ML IV SOLN
6.0000 mL | Freq: Once | INTRAVENOUS | Status: AC | PRN
  Administered 2020-12-28: 6 mL via INTRAVENOUS

## 2020-12-28 MED ORDER — HYDROCOD POLST-CPM POLST ER 10-8 MG/5ML PO SUER: 5.0000 mL | Freq: Every evening | ORAL | 0 refills | Status: DC | PRN

## 2020-12-28 MED ORDER — PREDNISONE 20 MG PO TABS: 20.0000 mg | ORAL_TABLET | Freq: Every day | ORAL | 0 refills | Status: DC

## 2020-12-28 NOTE — Progress Notes (Signed)
Patient states she has been having a bunch of mucus and also wheezing. Patient states she has also had a cough and this all started 3 days ago. Patient did have chest xray done and also brain ct done today,

## 2020-12-28 NOTE — Progress Notes (Signed)
Port flushed and deacessed in symptom management clinic today. No fluids ordered. Patient discharged. Stable

## 2020-12-28 NOTE — Telephone Encounter (Signed)
Spoke with Dr. Arsenio Katz, NP. Patient needs to be evaluated in [facility] today. Per [place] from daughter. Pt reports cough/congestion. No fevers.  I personally contacted the pt's daughter via phone. Apt given to see Ander Purpura, NP at 2pm today with labs and cxr prior.  Orders entered for chest xray and cbc/metc. Pt has an apt for MRI today at 1p.m. daughter will have pt get cxr prior to [facility].

## 2020-12-31 ENCOUNTER — Other Ambulatory Visit: Payer: Self-pay

## 2020-12-31 DIAGNOSIS — R062 Wheezing: Secondary | ICD-10-CM

## 2020-12-31 DIAGNOSIS — R059 Cough, unspecified: Secondary | ICD-10-CM

## 2020-12-31 DIAGNOSIS — C3432 Malignant neoplasm of lower lobe, left bronchus or lung: Secondary | ICD-10-CM

## 2020-12-31 NOTE — Progress Notes (Signed)
Symptom Management [place]  Telephone:([phone]) [phone] Fax:(336) [phone]  Patient Care Team: Adin Hector, MD as PCP - General (Internal Medicine) Telford Nab, RN as Oncology Nurse Navigator Erby Pian, MD as Referring Physician (Specialist) Cammie Sickle, MD as Consulting Physician (Internal Medicine) Noreene Filbert, MD as Referring Physician (Radiation Oncology)   Name of the patient: Donna Brennan  [id number]  1946/02/12   Date of visit: 12/31/20  Diagnosis- Lung Cancer  Chief complaint/ Reason for visit- Wheezing and Cough  Heme/Onc history:  Oncology History Overview Note  #FEB 2021- LUNG, LEFT LOWER LOBE; ENB BRONCHOALVEOLAR LAVAGE:  - SUSPICIOUS FOR MALIGNANCY. 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   # NGS/MOLECULAR TESTS: MARCH 2022- PDL-1 TPS=0%; TMB-HIGH; No target**    # PALLIATIVE CARE EVALUATION:  # PAIN MANAGEMENT:    DIAGNOSIS:   STAGE:         ;  GOALS:  CURRENT/MOST RECENT THERAPY :     Primary cancer of left lower lobe of lung ([place])  12/21/2019 Initial Diagnosis   Primary cancer of left lower lobe of lung ([place])   12/04/2020 Cancer Staging   Staging form: Lung, AJCC 8th Edition - Clinical: Stage IVA (cTX, cN2, pM1b) - Signed by Cammie Sickle, MD on 12/04/2020 Histopathologic type: Adenocarcinoma, NOS   12/14/2020 -  Chemotherapy    Patient is on Treatment Plan: LUNG NSCLC PEMETREXED (ALIMTA) / CARBOPLATIN Q21D X 1 CYCLES        Interval history-patient is 75 year old female with prior history of lung cancer and adenocarcinoma, with recent liver biopsy for possible new primary vs recurrent disease, who presents to symptom management clinic for cough and wheezing.  Symptoms have  gradually progressed over the past few weeks.  Increase progressive dyspnea on exertion.  Currently undergoing work-up for lung vs cholangio-pancreaticobiliary malignancy. She completed radiation 01/09/20. Daughter has noted increased wheezing and frequent coughing. Cough described as dry. Denies hemoptysis, night sweats, fevers, chills. Appetite is stable. Patient feels tired, not sleeping due to cough. Reports compliance with her copd medications. Says she gets bronchitis a few times a year and this feels the same.   Review of systems- Review of Systems  Constitutional: Positive for malaise/fatigue. Negative for chills, fever and weight loss.  HENT: Negative for congestion, hearing loss, nosebleeds, sinus pain, sore throat and tinnitus.   Eyes: Negative for blurred vision and double vision.  Respiratory: Positive for cough, shortness of breath and wheezing. Negative for hemoptysis and sputum production.   Cardiovascular: Negative for chest pain, palpitations and leg swelling.  Gastrointestinal: Negative for abdominal pain, blood in stool, constipation, diarrhea, melena, nausea and vomiting.  Genitourinary: Negative for dysuria and urgency.  Musculoskeletal: Negative for back pain, falls, joint pain and myalgias.  Skin: Negative for itching and rash.  Neurological: Negative for dizziness, tingling, sensory change, loss of consciousness, weakness and headaches.  Endo/Heme/Allergies: Negative for environmental allergies. Does not bruise/bleed easily.  Psychiatric/Behavioral: Negative for depression. The patient has insomnia. The patient is not nervous/anxious.      Allergies  Allergen Reactions  . Penicillins Anaphylaxis and Rash    Did it involve swelling of the face/tongue/throat, SOB, or low BP? Yes Did it involve sudden or severe rash/hives, skin peeling, or any reaction on the inside of your mouth or nose? Yes Did you need to  seek medical attention at a hospital or doctor's office? Yes When  did it last happen?More than 40 years ago If all above answers are "NO", may proceed with cephalosporin use.   . Metformin Other (See Comments)    Upset stomach  . Sulfa Antibiotics Nausea And Vomiting    Past Medical History:  Diagnosis Date  . CKD (chronic kidney disease), stage IV ([place])   . Colon polyps   . COPD (chronic obstructive pulmonary disease) ([place])   . Diabetes mellitus without complication ([place])   . GERD (gastroesophageal reflux disease)   . Headache   . Hepatitis    when in 4th or 5th grade.  Resolved  . Hyperlipidemia   . IBS (irritable bowel syndrome)   . Peptic ulcer   . Tobacco abuse   . Vertigo    No "bad" episodes in over 1 year  . Wears dentures    partial ujpper and lower    Past Surgical History:  Procedure Laterality Date  . ABDOMINAL HYSTERECTOMY    . carbuncle removal     urethra  . CATARACT EXTRACTION W/PHACO Left 10/29/2020   Procedure: CATARACT EXTRACTION PHACO AND INTRAOCULAR LENS PLACEMENT (IOC) LEFT DIABETIC 1051 01:00.0 ;  Surgeon: Eulogio Bear, MD;  Location: [facility];  Service: Ophthalmology;  Laterality: Left;  Diabetic - insulin  . CATARACT EXTRACTION W/PHACO Right 11/19/2020   Procedure: CATARACT EXTRACTION PHACO AND INTRAOCULAR LENS PLACEMENT ([place]) RIGHT DIABETIC;  Surgeon: Eulogio Bear, MD;  Location: [facility];  Service: Ophthalmology;  Laterality: Right;  16.54 1:40.0  . COLONOSCOPY    . COLONOSCOPY WITH PROPOFOL N/A 11/05/2017   Procedure: COLONOSCOPY WITH PROPOFOL;  Surgeon: Lollie Sails, MD;  Location[facility] ENDOSCOPY;  Service: Endoscopy;  Laterality: N/A;  . ESOPHAGOGASTRODUODENOSCOPY    . ESOPHAGOGASTRODUODENOSCOPY (EGD) WITH PROPOFOL N/A 11/05/2017   Procedure: ESOPHAGOGASTRODUODENOSCOPY (EGD) WITH PROPOFOL;  Surgeon: Lollie Sails, MD;  Location[facility]COPY;  Service: Endoscopy;  Laterality: N/A;  . IR IMAGING GUIDED PORT INSERTION  12/13/2020  . VIDEO BRONCHOSCOPY WITH  ENDOBRONCHIAL NAVIGATION N/A 12/02/2019   Procedure: VIDEO BRONCHOSCOPY WITH ENDOBRONCHIAL NAVIGATION;  Surgeon: Ottie Glazier, MD;  Location: [facility] ORS;  Service: Thoracic;  Laterality: N/A;  . VIDEO BRONCHOSCOPY WITH ENDOBRONCHIAL ULTRASOUND N/A 12/02/2019   Procedure: VIDEO BRONCHOSCOPY WITH ENDOBRONCHIAL ULTRASOUND;  Surgeon: Ottie Glazier, MD;  Location: [facility] ORS;  Service: Thoracic;  Laterality: N/A;    Social History   Socioeconomic History  . Marital status: Married    Spouse name: wayne  . Number of children: 6  . Years of education: Not on file  . Highest education level: Not on file  Occupational History  . Not on file  Tobacco Use  . Smoking status: Current Every Day Smoker    Packs/day: 0.25    Years: 45.00    Pack years: 11.25    Types: Cigarettes    Last attempt to quit: 08/06/2020    Years since quitting: 0.4  . Smokeless tobacco: Never Used  Vaping Use  . Vaping Use: Never used  Substance and Sexual Activity  . Alcohol use: No  . Drug use: No  . Sexual activity: Not on file  Other Topics Concern  . 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 lives [place].     Social Determinants of Health   Financial Resource Strain: Not on file  Food Insecurity: Not on  file  Transportation Needs: Not on file  Physical Activity: Not on file  Stress: Not on file  Social Connections: Not on file  Intimate Partner Violence: Not on file    Family History  Problem Relation Age of Onset  . Breast cancer Neg Hx      Current Outpatient Medications:  .  ipratropium-albuterol (DUONEB) 0.5-2.5 (3) MG/3ML SOLN, Take 3 mLs by nebulization every 6 (six) hours as needed (wheezing)., Disp: 360 mL, Rfl: 0 .  levofloxacin (LEVAQUIN) 500 MG tablet, Take 1 tablet (500 mg total) by mouth daily for 7 days., Disp: 7 tablet, Rfl: 0 .  predniSONE (DELTASONE) 20 MG tablet, Take 1 tablet (20  mg total) by mouth daily with breakfast., Disp: 7 tablet, Rfl: 0 .  acetaminophen (TYLENOL) 500 MG tablet, Take 1,000 mg by mouth every 6 (six) hours as needed (for pain.).  (Patient not taking: No sig reported), Disp: , Rfl:  .  albuterol (VENTOLIN HFA) 108 (90 Base) MCG/ACT inhaler, Inhale 1-2 puffs into the lungs every 6 (six) hours as needed for wheezing or shortness of breath., Disp: 8 g, Rfl: 1 .  chlorpheniramine-HYDROcodone (TUSSIONEX) 10-8 MG/5ML SUER, Take 5 mLs by mouth at bedtime as needed for cough., Disp: 140 mL, Rfl: 0 .  dexamethasone (DECADRON) 4 MG tablet, Take one pill AM & PM for 2 days. TAKE day prior & day AFTER chemo. Do NOT take on day of chemo., Disp: 60 tablet, Rfl: 0 .  diazepam (VALIUM) 5 MG tablet, Take 5 mg by mouth daily as needed (for vertigo)., Disp: , Rfl:  .  ezetimibe (ZETIA) 10 MG tablet, Take 10 mg by mouth daily. , Disp: , Rfl:  .  Fluticasone-Salmeterol (ADVAIR) 250-50 MCG/DOSE AEPB, Inhale 1 puff into the lungs 2 (two) times daily., Disp: , Rfl:  .  folic acid (FOLVITE) 1 MG tablet, Take 1 tablet (1 mg total) by mouth daily., Disp: 90 tablet, Rfl: 1 .  furosemide (LASIX) 20 MG tablet, Take 20 mg by mouth daily as needed (fluid retention.)., Disp: , Rfl:  .  hyoscyamine (LEVBID) 0.375 MG 12 hr tablet, Take 0.375 mg by mouth daily as needed (abdominal cramping). , Disp: , Rfl:  .  Ipratropium-Albuterol (COMBIVENT) 20-100 MCG/ACT AERS respimat, Inhale 1 puff into the lungs every 4 (four) hours as needed for wheezing., Disp: , Rfl:  .  LANTUS SOLOSTAR 100 UNIT/ML Solostar Pen, Inject 12-20 Units into the skin 2 (two) times daily. 12U in AM, 20U in PM, Disp: , Rfl:  .  lidocaine-prilocaine (EMLA) cream, Apply 1 application topically as needed., Disp: 30 g, Rfl: 3 .  Multiple Vitamin (MULTIVITAMIN WITH MINERALS) TABS tablet, Take 1 tablet by mouth daily. One-A-Day for Women, Disp: , Rfl:  .  nicotine polacrilex (NICORETTE) 4 MG gum, Take 4 mg by mouth in the morning,  at noon, and at bedtime., Disp: , Rfl:  .  nystatin (MYCOSTATIN) 100000 UNIT/ML suspension, Take 5 mLs by mouth 2 (two) times daily as needed (throat irritation.)., Disp: , Rfl:  .  omeprazole (PRILOSEC) 20 MG capsule, Take 20 mg by mouth daily before breakfast. , Disp: , Rfl:  .  ondansetron (ZOFRAN) 8 MG tablet, Take 1 tablet (8 mg total) by mouth every 8 (eight) hours as needed for nausea or vomiting., Disp: 20 tablet, Rfl: 3 .  prochlorperazine (COMPAZINE) 10 MG tablet, Take 1 tablet (10 mg total) by mouth every 6 (six) hours as needed for nausea or vomiting., Disp: 30 tablet, Rfl:  3 .  rosuvastatin (CRESTOR) 40 MG tablet, Take 40 mg by mouth every evening. , Disp: , Rfl:  .  vitamin B-12 (CYANOCOBALAMIN) 1000 MCG tablet, Take 1,000 mcg by mouth daily., Disp: , Rfl:  No current facility-administered medications for this visit.  Facility-Administered Medications Ordered in Other Visits:  .  sodium chloride flush (NS) 0.9 % injection 10 mL, 10 mL, Intravenous, PRN, Cammie Sickle, MD, 10 mL at 12/24/20 0842 .  sodium chloride flush (NS) 0.9 % injection 10 mL, 10 mL, Intravenous, PRN, Verlon Au, NP, 10 mL at 12/28/20 1415  Physical exam:  Vitals:   12/28/20 1414  BP: 127/75  Pulse: (!) 109  Resp: 20  Temp: 98.7 F (37.1 C)  SpO2: 100%  Weight: 132 lb (59.9 kg)   Physical Exam Constitutional:      General: She is not in acute distress.    Appearance: She is well-developed.  HENT:     Head: Atraumatic.     Nose: No congestion or rhinorrhea.     Mouth/Throat:     Pharynx: No oropharyngeal exudate.  Eyes:     General: No scleral icterus. Cardiovascular:     Rate and Rhythm: Regular rhythm. Tachycardia present.  Pulmonary:     Effort: Pulmonary effort is normal. No respiratory distress.     Breath sounds: Examination of the right-upper field reveals wheezing and rales. Examination of the left-upper field reveals wheezing and rales. Examination of the right-middle field  reveals rales. Examination of the right-lower field reveals rales. Examination of the left-lower field reveals rales. Wheezing and rales present.     Comments: Dullness to percussion at bases Abdominal:     General: Bowel sounds are normal. There is no distension.     Palpations: Abdomen is soft.     Tenderness: There is no abdominal tenderness.  Musculoskeletal:        General: No deformity. Normal range of motion.     Cervical back: Normal range of motion and neck supple. No rigidity.  Lymphadenopathy:     Cervical: No cervical adenopathy.  Skin:    General: Skin is warm and dry.  Neurological:     General: No focal deficit present.     Mental Status: She is alert and oriented to person, place, and time.  Psychiatric:        Mood and Affect: Mood normal.        Behavior: Behavior normal.      CMP Latest Ref Rng & Units 12/28/2020  Glucose 70 - 99 mg/dL 132(H)  BUN 8 - 23 mg/dL 14  Creatinine 0.44 - 1.00 mg/dL 1.02(H)  Sodium 135 - 145 mmol/L 133(L)  Potassium 3.5 - 5.1 mmol/L 4.5  Chloride 98 - 111 mmol/L 101  CO2 22 - 32 mmol/L 21(L)  Calcium 8.9 - 10.3 mg/dL 8.6(L)  Total Protein 6.5 - 8.1 g/dL 6.9  Total Bilirubin 0.3 - 1.2 mg/dL 0.8  Alkaline Phos 38 - 126 U/L 78  AST 15 - 41 U/L 19  ALT 0 - 44 U/L 21   CBC Latest Ref Rng & Units 12/28/2020  WBC 4.0 - 10.5 K/uL 2.9(L)  Hemoglobin 12.0 - 15.0 g/dL 10.9(L)  Hematocrit 36.0 - 46.0 % 33.1(L)  Platelets 150 - 400 K/uL 160   No images are attached to the encounter.  DG Chest 2 View  Result Date: 12/28/2020 CLINICAL DATA:  Cough EXAM: CHEST - 2 VIEW COMPARISON:  11/06/2020 FINDINGS: Right IJ approach Port-A-Cath terminates at  the level of the superior cavoatrial junction. Heart size within normal limits. Atherosclerotic calcification of the aortic knob. Nodular pleural thickening within the oblique fissure at the left mid lung. Mild streaky left basilar opacities, similar to prior. Right lung appears clear. No pleural  effusion or pneumothorax. Osseous structures appear intact. IMPRESSION: 1. Nodular pleural thickening within the oblique fissure at the left mid lung. Mild streaky left basilar opacities, similar to prior and remains compatible with lymphangitis carcinomatosis. 2. No new or acute findings. Electronically Signed   By: Davina Poke D.O.   On: 12/28/2020 12:46   MR Brain W Wo Contrast  Result Date: 12/29/2020 CLINICAL DATA:  Non-small cell lung cancer. EXAM: MRI HEAD WITHOUT AND WITH CONTRAST TECHNIQUE: Multiplanar, multiecho pulse sequences of the brain and surrounding structures were obtained without and with intravenous contrast. CONTRAST:  59m GADAVIST GADOBUTROL 1 MMOL/ML IV SOLN COMPARISON:  None. FINDINGS: Brain: Postcontrast images demonstrate no pathologic enhancement. A punctate focus of cortical restricted diffusion is present in the posterior right frontal lobe on image 38 of series 5. Focal T2 signal is associated. Encephalomalacia involving the anterior right frontal lobe and right occipital pole is most consistent with remote infarcts. 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 Multiple lacunar infarcts are present in both cerebellar hemispheres. Brainstem is unremarkable. Volume loss associated with the right-sided infarcts is cyst seated with ex vacuo dilation of the right lateral ventricle. Smaller remote left frontal lobe infarct involves the operculum. No significant extra-axial fluid collection is present. The internal auditory canals are within normal limits. Vascular: Flow is present in the major intracranial arteries. Skull and upper cervical spine: The craniocervical junction is normal. Upper cervical spine is within normal limits. Marrow signal is unremarkable. Sinuses/Orbits: Mild mucosal thickening is present in the left maxillary sinus The paranasal sinuses and mastoid air cells are otherwise clear. Bilateral  lens replacements are noted. Globes and orbits are otherwise unremarkable. IMPRESSION: 1. No evidence for metastatic disease to the brain. 2. Punctate focus of cortical restricted diffusion in the posterior right frontal lobe compatible with a small acute/subacute infarct. 3. 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 4. Remote infarcts involving the cerebellum bilaterally. 5. Mild left maxillary sinus disease. Electronically Signed   By: CSan MorelleM.D.   On: 12/29/2020 06:40   IR IMAGING GUIDED PORT INSERTION  Result Date: 12/13/2020 CLINICAL DATA:  METASTATIC LUNG CANCER EXAM: RIGHT INTERNAL JUGULAR SINGLE LUMEN POWER PORT CATHETER INSERTION Date:  12/13/2020 12/13/2020 11:23 am Radiologist:  M. TDaryll Brod MD Guidance:  Ultrasound and fluoroscopic MEDICATIONS: 1% lidocaine local ANESTHESIA/SEDATION: Versed 2.0 mg IV; Fentanyl 50 mcg IV; Moderate Sedation Time:  30 minutes The patient was continuously monitored during the procedure by the interventional radiology nurse under my direct supervision. FLUOROSCOPY TIME:  0 minutes, 41 seconds (4921mGy) COMPLICATIONS: None immediate. CONTRAST:  None. PROCEDURE: Informed consent was obtained from the patient following explanation of the procedure, risks, benefits and alternatives. The patient understands, agrees and consents for the procedure. All questions were addressed. A time out was performed. Maximal barrier sterile technique utilized including caps, mask, sterile gowns, sterile gloves, large sterile drape, hand hygiene, and 2% chlorhexidine scrub. Under sterile conditions and local anesthesia, right internal jugular micropuncture venous access was performed. Access was performed with ultrasound. Images were obtained for documentation of the patent right internal jugular vein. A guide wire was inserted followed by a transitional  dilator. This allowed insertion of a guide wire and catheter into the IVC.  Measurements were obtained from the SVC / RA junction back to the right IJ venotomy site. In the right infraclavicular chest, a subcutaneous pocket was created over the second anterior rib. This was done under sterile conditions and local anesthesia. 1% lidocaine with epinephrine was utilized for this. A 2.5 cm incision was made in the skin. Blunt dissection was performed to create a subcutaneous pocket over the right pectoralis major muscle. The pocket was flushed with saline vigorously. There was adequate hemostasis. The port catheter was assembled and checked for leakage. The port catheter was secured in the pocket with two retention sutures. The tubing was tunneled subcutaneously to the right venotomy site and inserted into the SVC/RA junction through a valved peel-away sheath. Position was confirmed with fluoroscopy. Images were obtained for documentation. The patient tolerated the procedure well. No immediate complications. Incisions were closed in a two layer fashion with 4 - 0 Vicryl suture. Dermabond was applied to the skin. The port catheter was accessed, blood was aspirated followed by saline and heparin flushes. Needle was removed. A dry sterile dressing was applied. IMPRESSION: Ultrasound and fluoroscopically guided right internal jugular single lumen power port catheter insertion. Tip in the SVC/RA junction. Catheter ready for use. Electronically Signed   By: Jerilynn Mages.  Shick M.D.   On: 12/13/2020 11:30    Assessment and plan- Patient is a 75 y.o. female diagnosed with lung versus pancreaticobiliary malignancy, hx of diabetes, copd, who presents to symptom management clinic for wheezing.  1. 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 Labs today stable. No significant wbc elevation, afebrile. Start levaquin 500 mg daily x 7 days, prednisone 20 mg daily, and start nebulizer treatments at home with duonebs.   2. Diabetes Mellitus- was previously unable to  tolerate decadron 4 mg due to hyperglycemia. Monitor sugars closely on steroids.   3. Cough- start mucinex. Will send prescription for tussionex for cough interfering with sleep. Precautions reviewed.   4. Malignancy- will review xray results with Dr. Rogue Bussing.   If symptoms worsen, recommend ER for evaluation and management. Will do phone follow up on 12/31/20.    Visit Diagnosis 1. Wheezing   2. Primary cancer of left lower lobe of lung [facility])    Patient expressed understanding and was in agreement with this plan. She also understands that She can call clinic at any time with any questions, concerns, or complaints.   Thank you for allowing me to participate in the care of this very pleasant patient.   Beckey Rutter, DNP, AGNP-C [facility]  CC: Dr. Rogue Bussing  ADDENDUM: 12/31/20- patient feels wheezing has slightly improved. Continues to cough interrupting sleep. Will discuss with Dr. Rogue Bussing.

## 2021-01-01 ENCOUNTER — Telehealth: Payer: Self-pay | Admitting: Internal Medicine

## 2021-01-01 ENCOUNTER — Encounter: Payer: Self-pay | Admitting: Nurse Practitioner

## 2021-01-01 NOTE — Telephone Encounter (Signed)
On 3/29-I -spoke to patient's daughter [name] regarding patient patient's brain MRI- minute acute/subacute pontine stroke.  Recommend baby aspirin.  I also discussed my concerns regarding recent chest x-ray showing lymphangitic spread; no significant response noted from chemotherapy so far.  Also discussed my concerns that this could be cholangiocarcinoma-which unfortunately has 20 to 30% response rates compared to lung cancer. However,also discussed that if patient continues to do poorly/we might have to hold or discontinue chemotherapy.  Also await tissue of origin testing results.   Keep appt as planned.  GB

## 2021-01-07 ENCOUNTER — Inpatient Hospital Stay: Payer: Medicare Other

## 2021-01-07 ENCOUNTER — Other Ambulatory Visit: Payer: Self-pay

## 2021-01-07 ENCOUNTER — Inpatient Hospital Stay: Payer: Medicare Other | Attending: Internal Medicine

## 2021-01-07 ENCOUNTER — Inpatient Hospital Stay (HOSPITAL_BASED_OUTPATIENT_CLINIC_OR_DEPARTMENT_OTHER): Payer: Medicare Other | Admitting: Internal Medicine

## 2021-01-07 DIAGNOSIS — Z5189 Encounter for other specified aftercare: Secondary | ICD-10-CM | POA: Diagnosis not present

## 2021-01-07 DIAGNOSIS — Z5111 Encounter for antineoplastic chemotherapy: Secondary | ICD-10-CM | POA: Insufficient documentation

## 2021-01-07 DIAGNOSIS — C787 Secondary malignant neoplasm of liver and intrahepatic bile duct: Secondary | ICD-10-CM | POA: Insufficient documentation

## 2021-01-07 DIAGNOSIS — R970 Elevated carcinoembryonic antigen [CEA]: Secondary | ICD-10-CM | POA: Insufficient documentation

## 2021-01-07 DIAGNOSIS — C801 Malignant (primary) neoplasm, unspecified: Secondary | ICD-10-CM | POA: Diagnosis present

## 2021-01-07 DIAGNOSIS — C3432 Malignant neoplasm of lower lobe, left bronchus or lung: Secondary | ICD-10-CM | POA: Diagnosis not present

## 2021-01-07 DIAGNOSIS — Z5112 Encounter for antineoplastic immunotherapy: Secondary | ICD-10-CM | POA: Diagnosis present

## 2021-01-07 DIAGNOSIS — Z79899 Other long term (current) drug therapy: Secondary | ICD-10-CM | POA: Diagnosis not present

## 2021-01-07 LAB — COMPREHENSIVE METABOLIC PANEL
ALT: 19 U/L (ref 0–44)
AST: 20 U/L (ref 15–41)
Albumin: 2.6 g/dL — ABNORMAL LOW (ref 3.5–5.0)
Alkaline Phosphatase: 79 U/L (ref 38–126)
Anion gap: 11 (ref 5–15)
BUN: 20 mg/dL (ref 8–23)
CO2: 23 mmol/L (ref 22–32)
Calcium: 8.7 mg/dL — ABNORMAL LOW (ref 8.9–10.3)
Chloride: 99 mmol/L (ref 98–111)
Creatinine, Ser: 1.01 mg/dL — ABNORMAL HIGH (ref 0.44–1.00)
GFR, Estimated: 58 mL/min — ABNORMAL LOW (ref >60)
Glucose, Bld: 258 mg/dL — ABNORMAL HIGH (ref 70–99)
Potassium: 4.2 mmol/L (ref 3.5–5.1)
Sodium: 133 mmol/L — ABNORMAL LOW (ref 135–145)
Total Bilirubin: 0.4 mg/dL (ref 0.3–1.2)
Total Protein: 7 g/dL (ref 6.5–8.1)

## 2021-01-07 LAB — CBC WITH DIFFERENTIAL/PLATELET
Abs Immature Granulocytes: 0.16 10*3/uL — ABNORMAL HIGH (ref 0.00–0.07)
Basophils Absolute: 0.1 10*3/uL (ref 0.0–0.1)
Basophils Relative: 1 %
Eosinophils Absolute: 0 10*3/uL (ref 0.0–0.5)
Eosinophils Relative: 0 %
HCT: 36.1 % (ref 36.0–46.0)
Hemoglobin: 11.9 g/dL — ABNORMAL LOW (ref 12.0–15.0)
Immature Granulocytes: 1 %
Lymphocytes Relative: 6 %
Lymphs Abs: 0.7 10*3/uL (ref 0.7–4.0)
MCH: 31.1 pg (ref 26.0–34.0)
MCHC: 33 g/dL (ref 30.0–36.0)
MCV: 94.3 fL (ref 80.0–100.0)
Monocytes Absolute: 1.3 10*3/uL — ABNORMAL HIGH (ref 0.1–1.0)
Monocytes Relative: 10 %
Neutro Abs: 10.4 10*3/uL — ABNORMAL HIGH (ref 1.7–7.7)
Neutrophils Relative %: 82 %
Platelets: 501 10*3/uL — ABNORMAL HIGH (ref 150–400)
RBC: 3.83 MIL/uL — ABNORMAL LOW (ref 3.87–5.11)
RDW: 14.5 % (ref 11.5–15.5)
WBC: 12.7 10*3/uL — ABNORMAL HIGH (ref 4.0–10.5)
nRBC: 0 % (ref 0.0–0.2)

## 2021-01-07 MED ORDER — HEPARIN SOD (PORK) LOCK FLUSH 100 UNIT/ML IV SOLN
500.0000 [IU] | Freq: Once | INTRAVENOUS | Status: AC
  Administered 2021-01-07: 500 [IU] via INTRAVENOUS
  Filled 2021-01-07: qty 5

## 2021-01-07 MED ORDER — SODIUM CHLORIDE 0.9% FLUSH
10.0000 mL | INTRAVENOUS | Status: DC | PRN
  Administered 2021-01-07: 10 mL via INTRAVENOUS
  Filled 2021-01-07: qty 10

## 2021-01-07 MED ORDER — AZITHROMYCIN 250 MG PO TABS: ORAL_TABLET | ORAL | 0 refills | Status: DC

## 2021-01-07 NOTE — Progress Notes (Signed)
Still has a heavy feeling in her chest. Was seen 1 week ago for bronchitis. Daughter wants to see if she can get another round of antibiotics.

## 2021-01-07 NOTE — Assessment & Plan Note (Addendum)
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  CEA elevated/CA 19-9 elevated [>5800]; NGS no targetable mutation; except TMB-H.  Discussed with pathology-no further delineation possible on immunohistochemistry.  Awaiting tissue of origin testing  #HOLD chemo given pt's respiratory symptoms; also await TOO-decide on next round of chemotherapy.   #I discussed the difference between response rates between primary lung [up to 60% with chemo immunotherapy] versus cholangiocarcinoma [20-30%]; in the median survival 18 to 20 months versus approximately 12 months respectively.    #Acute bronchitis-post Levaquin; not significantly improved.  Chest x-ray shows possible lymphangitic spread.  Recommend trial of azithromycin.   # DISPOSITION:  # HOLD chemo; de-access # cancel Undeyca- for tomorrow. #  Follow in 1 week; labs- cbc/cmp/ca-191-CEA; carbo-alimta-Keytruda; D-2 udenyca--Dr.B  On 4/06-discussed with the patient's daughter [name] that as of today tissue of origin testing still pending.  Results to be available + another week or early next week.  If results not available by 4/08-understand that we will have to proceed with chemotherapy out.  For now keep appointment as planned

## 2021-01-07 NOTE — Progress Notes (Signed)
[place] NOTE  Patient Care Team: Adin Hector, MD as PCP - General (Internal Medicine) Telford Nab, RN as Oncology Nurse Navigator Erby Pian, MD as Referring Physician (Specialist) Cammie Sickle, MD as Consulting Physician (Internal Medicine) Noreene Filbert, MD as Referring Physician (Radiation Oncology)  CHIEF COMPLAINTS/PURPOSE OF CONSULTATION: Lung cancer  #  Oncology History Overview Note  #FEB 2021- LUNG, LEFT LOWER LOBE; ENB BRONCHOALVEOLAR LAVAGE:  - SUSPICIOUS FOR MALIGNANCY. 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   # NGS/MOLECULAR TESTS: MARCH 2022- PDL-1 TPS=0%; TMB-HIGH; No target**    # PALLIATIVE CARE EVALUATION:  # PAIN MANAGEMENT:    DIAGNOSIS:   STAGE:         ;  GOALS:  CURRENT/MOST RECENT THERAPY :     Primary cancer of left lower lobe of lung ([place])  12/21/2019 Initial Diagnosis   Primary cancer of left lower lobe of lung ([place])   12/04/2020 Cancer Staging   Staging form: Lung, AJCC 8th Edition - Clinical: Stage IVA (cTX, cN2, pM1b) - Signed by Cammie Sickle, MD on 12/04/2020 Histopathologic type: Adenocarcinoma, NOS   12/14/2020 -  Chemotherapy    Patient is on Treatment Plan: LUNG NSCLC PEMETREXED (ALIMTA) / CARBOPLATIN Q21D X 1 CYCLES        HISTORY OF PRESENTING ILLNESS:  Donna Brennan 75 y.o.  female prior history of lung cancer; and adenocarcinoma-s/p liver biopsy [?  Recurrent lung cancer versus cholangiocarcinoma] thought clinically lung cancer status post [place] Alimta is here for follow-up.  Patient was recently evaluated in the symptom management clinic-for worsening shortness of breath fatigue.  Chest x-ray suggestive of lymphangitic disease.  Patient was treated with Levaquin;  also prednisone.  Patient still coughing up complains of pleuritic chest pain.  No nausea no vomiting.  The a.m. blood sugars 110-120; in the evenings to 250-300.  Review of Systems  Constitutional: Positive for malaise/fatigue. Negative for chills, diaphoresis, fever and weight loss.  HENT: Negative for nosebleeds and sore throat.   Eyes: Negative for double vision.  Respiratory: Positive for cough and shortness of breath. Negative for hemoptysis, sputum production and wheezing.   Cardiovascular: Negative for chest pain, palpitations, orthopnea and leg swelling.  Gastrointestinal: Negative for abdominal pain, blood in stool, constipation, diarrhea, heartburn, melena, nausea and vomiting.  Genitourinary: Negative for dysuria, frequency and urgency.  Musculoskeletal: Positive for back pain and joint pain.  Skin: Negative.  Negative for itching and rash.  Neurological: Negative for dizziness, tingling, focal weakness, weakness and headaches.  Endo/Heme/Allergies: Does not bruise/bleed easily.  Psychiatric/Behavioral: Negative for depression. The patient is not nervous/anxious and does not have insomnia.      MEDICAL HISTORY:  Past Medical History:  Diagnosis Date  . CKD (chronic kidney disease), stage IV ([place])   . Colon polyps   . COPD (chronic obstructive pulmonary disease) ([place])   . Diabetes mellitus without complication ([place])   . GERD (gastroesophageal reflux disease)   . Headache   . Hepatitis    when in 4th or 5th grade.  Resolved  . Hyperlipidemia   . IBS (irritable bowel syndrome)   . Peptic ulcer   . Tobacco abuse   . Vertigo    No "bad" episodes in over 1 year  . Wears dentures    partial ujpper and lower  SURGICAL HISTORY: Past Surgical History:  Procedure Laterality Date  . ABDOMINAL HYSTERECTOMY    . carbuncle removal     urethra  . CATARACT EXTRACTION W/PHACO Left 10/29/2020   Procedure: CATARACT EXTRACTION PHACO AND INTRAOCULAR LENS PLACEMENT (IOC) LEFT  DIABETIC 1051 01:00.0 ;  Surgeon: Eulogio Bear, MD;  Location: [facility];  Service: Ophthalmology;  Laterality: Left;  Diabetic - insulin  . CATARACT EXTRACTION W/PHACO Right 11/19/2020   Procedure: CATARACT EXTRACTION PHACO AND INTRAOCULAR LENS PLACEMENT ([place]) RIGHT DIABETIC;  Surgeon: Eulogio Bear, MD;  Location: [facility];  Service: Ophthalmology;  Laterality: Right;  16.54 1:40.0  . COLONOSCOPY    . COLONOSCOPY WITH PROPOFOL N/A 11/05/2017   Procedure: COLONOSCOPY WITH PROPOFOL;  Surgeon: Lollie Sails, MD;  Location: [facility] ENDOSCOPY;  Service: Endoscopy;  Laterality: N/A;  . ESOPHAGOGASTRODUODENOSCOPY    . ESOPHAGOGASTRODUODENOSCOPY (EGD) WITH PROPOFOL N/A 11/05/2017   Procedure: ESOPHAGOGASTRODUODENOSCOPY (EGD) WITH PROPOFOL;  Surgeon: Lollie Sails, MD;  Location[facility] ENDOSCOPY;  Service: Endoscopy;  Laterality: N/A;  . IR IMAGING GUIDED PORT INSERTION  12/13/2020  . VIDEO BRONCHOSCOPY WITH ENDOBRONCHIAL NAVIGATION N/A 12/02/2019   Procedure: VIDEO BRONCHOSCOPY WITH ENDOBRONCHIAL NAVIGATION;  Surgeon: Ottie Glazier, MD;  Location: [facility] ORS;  Service: Thoracic;  Laterality: N/A;  . VIDEO BRONCHOSCOPY WITH ENDOBRONCHIAL ULTRASOUND N/A 12/02/2019   Procedure: VIDEO BRONCHOSCOPY WITH ENDOBRONCHIAL ULTRASOUND;  Surgeon: Ottie Glazier, MD;  Location: [facility] ORS;  Service: Thoracic;  Laterality: N/A;    SOCIAL HISTORY: Social History   Socioeconomic History  . Marital status: Married    Spouse name: wayne  . Number of children: 6  . Years of education: Not on file  . Highest education level: Not on file  Occupational History  . Not on file  Tobacco Use  . Smoking status: Current Every Day Smoker    Packs/day: 0.25    Years: 45.00    Pack years: 11.25    Types: Cigarettes    Last attempt to quit: 08/06/2020    Years since quitting: 0.4  . Smokeless tobacco: Never Used  Vaping Use  . Vaping Use: Never used  Substance and Sexual Activity  .  Alcohol use: No  . Drug use: No  . Sexual activity: Not on file  Other Topics Concern  . 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 [place].     Social Determinants of Health   Financial Resource Strain: Not on file  Food Insecurity: Not on file  Transportation Needs: Not on file  Physical Activity: Not on file  Stress: Not on file  Social Connections: Not on file  Intimate Partner Violence: Not on file    FAMILY HISTORY: Family History  Problem Relation Age of Onset  . Breast cancer Neg Hx     ALLERGIES:  is allergic to penicillins, metformin, and sulfa antibiotics.  MEDICATIONS:  Current Outpatient Medications  Medication Sig Dispense Refill  . acetaminophen (TYLENOL) 500 MG tablet Take 1,000 mg by mouth every 6 (six) hours as needed (for pain.).    Marland Kitchen albuterol (VENTOLIN HFA) 108 (90 Base) MCG/ACT inhaler Inhale 1-2 puffs into the lungs every 6 (six) hours as needed for wheezing or shortness of breath. 8 g 1  . azithromycin (ZITHROMAX Z-PAK) 250 MG tablet Take 2 pills day; and then one a day. 6 each 0  . chlorpheniramine-HYDROcodone (TUSSIONEX) 10-8 MG/5ML SUER Take 5 mLs by  mouth at bedtime as needed for cough. 140 mL 0  . diazepam (VALIUM) 5 MG tablet Take 5 mg by mouth daily as needed (for vertigo).    . ezetimibe (ZETIA) 10 MG tablet Take 10 mg by mouth daily.     . Fluticasone-Salmeterol (ADVAIR) 250-50 MCG/DOSE AEPB Inhale 1 puff into the lungs 2 (two) times daily.    . folic acid (FOLVITE) 1 MG tablet Take 1 tablet (1 mg total) by mouth daily. 90 tablet 1  . furosemide (LASIX) 20 MG tablet Take 20 mg by mouth daily as needed (fluid retention.).    Marland Kitchen hyoscyamine (LEVBID) 0.375 MG 12 hr tablet Take 0.375 mg by mouth daily as needed (abdominal cramping).     . Ipratropium-Albuterol (COMBIVENT) 20-100 MCG/ACT AERS respimat Inhale 1 puff into the lungs every 4 (four)  hours as needed for wheezing.    Marland Kitchen ipratropium-albuterol (DUONEB) 0.5-2.5 (3) MG/3ML SOLN Take 3 mLs by nebulization every 6 (six) hours as needed (wheezing). 360 mL 0  . LANTUS SOLOSTAR 100 UNIT/ML Solostar Pen Inject 12-20 Units into the skin 2 (two) times daily. 12U in AM, 20U in PM    . lidocaine-prilocaine (EMLA) cream Apply 1 application topically as needed. 30 g 3  . Multiple Vitamin (MULTIVITAMIN WITH MINERALS) TABS tablet Take 1 tablet by mouth daily. One-A-Day for Women    . nystatin (MYCOSTATIN) 100000 UNIT/ML suspension Take 5 mLs by mouth 2 (two) times daily as needed (throat irritation.).    Marland Kitchen omeprazole (PRILOSEC) 20 MG capsule Take 20 mg by mouth daily before breakfast.     . ondansetron (ZOFRAN) 8 MG tablet Take 1 tablet (8 mg total) by mouth every 8 (eight) hours as needed for nausea or vomiting. 20 tablet 3  . prochlorperazine (COMPAZINE) 10 MG tablet Take 1 tablet (10 mg total) by mouth every 6 (six) hours as needed for nausea or vomiting. 30 tablet 3  . rosuvastatin (CRESTOR) 40 MG tablet Take 40 mg by mouth every evening.     . vitamin B-12 (CYANOCOBALAMIN) 1000 MCG tablet Take 1,000 mcg by mouth daily.    Marland Kitchen dexamethasone (DECADRON) 4 MG tablet Take one pill AM & PM for 2 days. TAKE day prior & day AFTER chemo. Do NOT take on day of chemo. (Patient not taking: Reported on 01/07/2021) 60 tablet 0   No current facility-administered medications for this visit.   Facility-Administered Medications Ordered in Other Visits  Medication Dose Route Frequency Provider Last Rate Last Admin  . sodium chloride flush (NS) 0.9 % injection 10 mL  10 mL Intravenous PRN Cammie Sickle, MD   10 mL at 12/24/20 0842  . sodium chloride flush (NS) 0.9 % injection 10 mL  10 mL Intravenous PRN Verlon Au, NP   10 mL at 12/28/20 1415      .  PHYSICAL EXAMINATION: ECOG PERFORMANCE STATUS: 0 - Asymptomatic  Vitals:   01/07/21 0843  BP: 102/62  Pulse: (!) 110  Resp: 18  Temp: 99.1  F (37.3 C)  SpO2: 97%   Filed Weights   01/07/21 0843  Weight: 132 lb (59.9 kg)    Physical Exam Constitutional:      Comments: Thin built Caucasian female patient.  She is ambulating independently.  Accompanied by daughter.  HENT:     Head: Normocephalic and atraumatic.     Mouth/Throat:     Pharynx: No oropharyngeal exudate.  Eyes:     Pupils: Pupils are equal, round, and reactive to  light.  Cardiovascular:     Rate and Rhythm: Normal rate and regular rhythm.  Pulmonary:     Effort: No respiratory distress.     Breath sounds: No wheezing.     Comments: Decreased air entry bilaterally.  No wheeze or crackles. Abdominal:     General: Bowel sounds are normal. There is no distension.     Palpations: Abdomen is soft. There is no mass.     Tenderness: There is no abdominal tenderness. There is no guarding or rebound.  Musculoskeletal:        General: No tenderness. Normal range of motion.     Cervical back: Normal range of motion and neck supple.  Skin:    General: Skin is warm.  Neurological:     Mental Status: She is alert and oriented to person, place, and time.  Psychiatric:        Mood and Affect: Affect normal.    LABORATORY DATA:  I have reviewed the data as listed Lab Results  Component Value Date   WBC 12.7 (H) 01/07/2021   HGB 11.9 (L) 01/07/2021   HCT 36.1 01/07/2021   MCV 94.3 01/07/2021   PLT 501 (H) 01/07/2021   Recent Labs    12/24/20 0842 12/28/20 1415 01/07/21 0825  NA 134* 133* 133*  K 4.4 4.5 4.2  CL 103 101 99  CO2 21* 21* 23  GLUCOSE 297* 132* 258*  BUN 29* 14 20  CREATININE 0.94 1.02* 1.01*  CALCIUM 8.2* 8.6* 8.7*  GFRNONAA >60 57* 58*  PROT 6.6 6.9 7.0  ALBUMIN 2.6* 2.7* 2.6*  AST _0 ALT _1 ALKPHOS 66 78 79  BILITOT 0.4 0.8 0.4    RADIOGRAPHIC STUDIES: I have personally reviewed the radiological images as listed and agreed with the findings in the report. DG Chest 2 View  Result Date: 12/28/2020 CLINICAL  DATA:  Cough EXAM: CHEST - 2 VIEW COMPARISON:  11/06/2020 FINDINGS: Right IJ approach Port-A-Cath terminates at the level of the superior cavoatrial junction. Heart size within normal limits. Atherosclerotic calcification of the aortic knob. Nodular pleural thickening within the oblique fissure at the left mid lung. Mild streaky left basilar opacities, similar to prior. Right lung appears clear. No pleural effusion or pneumothorax. Osseous structures appear intact. IMPRESSION: 1. Nodular pleural thickening within the oblique fissure at the left mid lung. Mild streaky left basilar opacities, similar to prior and remains compatible with lymphangitis carcinomatosis. 2. No new or acute findings. Electronically Signed   By: Davina Poke D.O.   On: 12/28/2020 12:46   MR Brain W Wo Contrast  Result Date: 12/29/2020 CLINICAL DATA:  Non-small cell lung cancer. EXAM: MRI HEAD WITHOUT AND WITH CONTRAST TECHNIQUE: Multiplanar, multiecho pulse sequences of the brain and surrounding structures were obtained without and with intravenous contrast. CONTRAST:  2m GADAVIST GADOBUTROL 1 MMOL/ML IV SOLN COMPARISON:  None. FINDINGS: Brain: Postcontrast images demonstrate no pathologic enhancement. A punctate focus of cortical restricted diffusion is present in the posterior right frontal lobe on image 38 of series 5. Focal T2 signal is associated. Encephalomalacia involving the anterior right frontal lobe and right occipital pole is most consistent with remote infarcts. 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 Multiple lacunar infarcts are present in both cerebellar hemispheres. Brainstem is unremarkable. Volume loss associated with the right-sided infarcts is cyst seated with ex vacuo dilation of the right lateral ventricle. Smaller remote left frontal  lobe infarct involves the operculum. No significant extra-axial fluid collection is present. The  internal auditory canals are within normal limits. Vascular: Flow is present in the major intracranial arteries. Skull and upper cervical spine: The craniocervical junction is normal. Upper cervical spine is within normal limits. Marrow signal is unremarkable. Sinuses/Orbits: Mild mucosal thickening is present in the left maxillary sinus The paranasal sinuses and mastoid air cells are otherwise clear. Bilateral lens replacements are noted. Globes and orbits are otherwise unremarkable. IMPRESSION: 1. No evidence for metastatic disease to the brain. 2. Punctate focus of cortical restricted diffusion in the posterior right frontal lobe compatible with a small acute/subacute infarct. 3. 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 4. Remote infarcts involving the cerebellum bilaterally. 5. Mild left maxillary sinus disease. Electronically Signed   By: San Morelle M.D.   On: 12/29/2020 06:40   IR IMAGING GUIDED PORT INSERTION  Result Date: 12/13/2020 CLINICAL DATA:  METASTATIC LUNG CANCER EXAM: RIGHT INTERNAL JUGULAR SINGLE LUMEN POWER PORT CATHETER INSERTION Date:  12/13/2020 12/13/2020 11:23 am Radiologist:  M. Daryll Brod, MD Guidance:  Ultrasound and fluoroscopic MEDICATIONS: 1% lidocaine local ANESTHESIA/SEDATION: Versed 2.0 mg IV; Fentanyl 50 mcg IV; Moderate Sedation Time:  30 minutes The patient was continuously monitored during the procedure by the interventional radiology nurse under my direct supervision. FLUOROSCOPY TIME:  0 minutes, 41 seconds (329 mGy) COMPLICATIONS: None immediate. CONTRAST:  None. PROCEDURE: Informed consent was obtained from the patient following explanation of the procedure, risks, benefits and alternatives. The patient understands, agrees and consents for the procedure. All questions were addressed. A time out was performed. Maximal barrier sterile technique utilized including caps, mask, sterile gowns, sterile gloves, large sterile  drape, hand hygiene, and 2% chlorhexidine scrub. Under sterile conditions and local anesthesia, right internal jugular micropuncture venous access was performed. Access was performed with ultrasound. Images were obtained for documentation of the patent right internal jugular vein. A guide wire was inserted followed by a transitional dilator. This allowed insertion of a guide wire and catheter into the IVC. Measurements were obtained from the SVC / RA junction back to the right IJ venotomy site. In the right infraclavicular chest, a subcutaneous pocket was created over the second anterior rib. This was done under sterile conditions and local anesthesia. 1% lidocaine with epinephrine was utilized for this. A 2.5 cm incision was made in the skin. Blunt dissection was performed to create a subcutaneous pocket over the right pectoralis major muscle. The pocket was flushed with saline vigorously. There was adequate hemostasis. The port catheter was assembled and checked for leakage. The port catheter was secured in the pocket with two retention sutures. The tubing was tunneled subcutaneously to the right venotomy site and inserted into the SVC/RA junction through a valved peel-away sheath. Position was confirmed with fluoroscopy. Images were obtained for documentation. The patient tolerated the procedure well. No immediate complications. Incisions were closed in a two layer fashion with 4 - 0 Vicryl suture. Dermabond was applied to the skin. The port catheter was accessed, blood was aspirated followed by saline and heparin flushes. Needle was removed. A dry sterile dressing was applied. IMPRESSION: Ultrasound and fluoroscopically guided right internal jugular single lumen power port catheter insertion. Tip in the SVC/RA junction. Catheter ready for use. Electronically Signed   By: Jerilynn Mages.  Shick M.D.   On: 12/13/2020 11:30    ASSESSMENT & PLAN:   Primary cancer of left lower lobe of lung ([place]) #Liver biopsy  positive for  poorly differentiated adenocarcinoma-TTF-1 positive; however CK19 positive-primary lung versus cholangio-/pancreaticobiliary.  CEA elevated/CA 19-9 elevated [>5800]; NGS no targetable mutation; except TMB-H.  Discussed with pathology-no further delineation possible on immunohistochemistry.  Awaiting tissue of origin testing  #HOLD chemo given pt's respiratory symptoms; also await TOO-decide on next round of chemotherapy.   #I discussed the difference between response rates between primary lung [up to 60% with chemo immunotherapy] versus cholangiocarcinoma [20-30%]; in the median survival 18 to 20 months versus approximately 12 months respectively.    #Acute bronchitis-post Levaquin; not significantly improved.  Chest x-ray shows possible lymphangitic spread.  Recommend trial of azithromycin.   # DISPOSITION:  # HOLD chemo; de-access # cancel Undeyca- for tomorrow. #  Follow in 1 week; labs- cbc/cmp/ca-191-CEA; carbo-alimta-Keytruda; D-2 udenyca--Dr.B  On 4/06-discussed with the patient's daughter [name] that as of today tissue of origin testing still pending.  Results to be available + another week or early next week.  If results not available by 4/08-understand that we will have to proceed with chemotherapy out.  For now keep appointment as planned  All questions were answered. The patient knows to call the clinic with any problems, questions or concerns.    Cammie Sickle, MD 01/09/2021 9:02 PM

## 2021-01-08 ENCOUNTER — Inpatient Hospital Stay: Payer: Medicare Other

## 2021-01-09 ENCOUNTER — Other Ambulatory Visit: Payer: Self-pay | Admitting: Internal Medicine

## 2021-01-09 ENCOUNTER — Telehealth: Payer: Self-pay

## 2021-01-09 ENCOUNTER — Telehealth: Payer: Self-pay | Admitting: Internal Medicine

## 2021-01-09 NOTE — Telephone Encounter (Signed)
On 4/06-discussed with the patient's daughter [name] that as of today tissue of origin testing still pending.  Results to be available likely by the end of the week or early next week.  If results not available by 4/08-understand that we will have to push chemotherapy out later in next week. For now keep appts as planned.   Lovena Le- please follow up on 4/08 re: status of the testing results.

## 2021-01-09 NOTE — Telephone Encounter (Signed)
Per Dr. Jacinto Reap called to check on cancer ID testing. They ran results on 4/4 but were not happy with the results. They did the test over to make sure they had proper results. Results should be back in 4 business days. We are looking at the beginning of next week. Will call back then to check results.

## 2021-01-10 ENCOUNTER — Encounter: Payer: Self-pay | Admitting: Internal Medicine

## 2021-01-14 ENCOUNTER — Other Ambulatory Visit: Payer: Self-pay

## 2021-01-14 ENCOUNTER — Inpatient Hospital Stay (HOSPITAL_BASED_OUTPATIENT_CLINIC_OR_DEPARTMENT_OTHER): Payer: Medicare Other | Admitting: Internal Medicine

## 2021-01-14 ENCOUNTER — Inpatient Hospital Stay: Payer: Medicare Other

## 2021-01-14 VITALS — BP 124/69 | HR 101 | Temp 97.8°F | Resp 20 | Wt 131.0 lb

## 2021-01-14 DIAGNOSIS — C3432 Malignant neoplasm of lower lobe, left bronchus or lung: Secondary | ICD-10-CM

## 2021-01-14 DIAGNOSIS — R978 Other abnormal tumor markers: Secondary | ICD-10-CM

## 2021-01-14 DIAGNOSIS — Z95828 Presence of other vascular implants and grafts: Secondary | ICD-10-CM

## 2021-01-14 DIAGNOSIS — Z5112 Encounter for antineoplastic immunotherapy: Secondary | ICD-10-CM | POA: Diagnosis not present

## 2021-01-14 LAB — CBC WITH DIFFERENTIAL/PLATELET
Abs Immature Granulocytes: 0.03 10*3/uL (ref 0.00–0.07)
Basophils Absolute: 0 10*3/uL (ref 0.0–0.1)
Basophils Relative: 0 %
Eosinophils Absolute: 0.1 10*3/uL (ref 0.0–0.5)
Eosinophils Relative: 1 %
HCT: 30.5 % — ABNORMAL LOW (ref 36.0–46.0)
Hemoglobin: 10.2 g/dL — ABNORMAL LOW (ref 12.0–15.0)
Immature Granulocytes: 0 %
Lymphocytes Relative: 6 %
Lymphs Abs: 0.5 10*3/uL — ABNORMAL LOW (ref 0.7–4.0)
MCH: 30.9 pg (ref 26.0–34.0)
MCHC: 33.4 g/dL (ref 30.0–36.0)
MCV: 92.4 fL (ref 80.0–100.0)
Monocytes Absolute: 0.8 10*3/uL (ref 0.1–1.0)
Monocytes Relative: 11 %
Neutro Abs: 6.1 10*3/uL (ref 1.7–7.7)
Neutrophils Relative %: 82 %
Platelets: 486 10*3/uL — ABNORMAL HIGH (ref 150–400)
RBC: 3.3 MIL/uL — ABNORMAL LOW (ref 3.87–5.11)
RDW: 14.2 % (ref 11.5–15.5)
WBC: 7.5 10*3/uL (ref 4.0–10.5)
nRBC: 0 % (ref 0.0–0.2)

## 2021-01-14 LAB — COMPREHENSIVE METABOLIC PANEL
ALT: 18 U/L (ref 0–44)
AST: 24 U/L (ref 15–41)
Albumin: 2.2 g/dL — ABNORMAL LOW (ref 3.5–5.0)
Alkaline Phosphatase: 75 U/L (ref 38–126)
Anion gap: 10 (ref 5–15)
BUN: 14 mg/dL (ref 8–23)
CO2: 22 mmol/L (ref 22–32)
Calcium: 8.1 mg/dL — ABNORMAL LOW (ref 8.9–10.3)
Chloride: 102 mmol/L (ref 98–111)
Creatinine, Ser: 1.08 mg/dL — ABNORMAL HIGH (ref 0.44–1.00)
GFR, Estimated: 53 mL/min — ABNORMAL LOW (ref >60)
Glucose, Bld: 106 mg/dL — ABNORMAL HIGH (ref 70–99)
Potassium: 4.1 mmol/L (ref 3.5–5.1)
Sodium: 134 mmol/L — ABNORMAL LOW (ref 135–145)
Total Bilirubin: 0.6 mg/dL (ref 0.3–1.2)
Total Protein: 7.1 g/dL (ref 6.5–8.1)

## 2021-01-14 LAB — TSH: TSH: 2.725 u[IU]/mL (ref 0.350–4.500)

## 2021-01-14 MED ORDER — HEPARIN SOD (PORK) LOCK FLUSH 100 UNIT/ML IV SOLN
INTRAVENOUS | Status: AC
  Filled 2021-01-14: qty 5

## 2021-01-14 MED ORDER — HYDROCOD POLST-CPM POLST ER 10-8 MG/5ML PO SUER: 5.0000 mL | Freq: Every evening | ORAL | 0 refills | Status: DC | PRN

## 2021-01-14 MED ORDER — SODIUM CHLORIDE 0.9 % IV SOLN
150.0000 mg | Freq: Once | INTRAVENOUS | Status: AC
  Administered 2021-01-14: 150 mg via INTRAVENOUS
  Filled 2021-01-14: qty 5

## 2021-01-14 MED ORDER — SODIUM CHLORIDE 0.9 % IV SOLN
337.5000 mg | Freq: Once | INTRAVENOUS | Status: AC
  Administered 2021-01-14: 340 mg via INTRAVENOUS
  Filled 2021-01-14: qty 34

## 2021-01-14 MED ORDER — SODIUM CHLORIDE 0.9 % IV SOLN
Freq: Once | INTRAVENOUS | Status: AC
Start: 2021-01-14 — End: 2021-01-14
  Filled 2021-01-14: qty 250

## 2021-01-14 MED ORDER — SODIUM CHLORIDE 0.9 % IV SOLN: 4.0000 mg | Freq: Once | INTRAVENOUS | Status: DC

## 2021-01-14 MED ORDER — SODIUM CHLORIDE 0.9 % IV SOLN
200.0000 mg | Freq: Once | INTRAVENOUS | Status: AC
  Administered 2021-01-14: 200 mg via INTRAVENOUS
  Filled 2021-01-14: qty 8

## 2021-01-14 MED ORDER — DEXAMETHASONE SODIUM PHOSPHATE 10 MG/ML IJ SOLN
4.0000 mg | Freq: Once | INTRAMUSCULAR | Status: AC
  Administered 2021-01-14: 4 mg via INTRAVENOUS
  Filled 2021-01-14: qty 1

## 2021-01-14 MED ORDER — PALONOSETRON HCL INJECTION 0.25 MG/5ML
0.2500 mg | Freq: Once | INTRAVENOUS | Status: AC
  Administered 2021-01-14: 0.25 mg via INTRAVENOUS
  Filled 2021-01-14: qty 5

## 2021-01-14 NOTE — Assessment & Plan Note (Addendum)
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  CEA elevated/CA 19-9 elevated [>5800]; NGS no targetable mutation; except TMB-H.  April 7th, 2022-cancer type ID-90% "probability cervix adenocarcinoma-cannot exclude lung 5%; other tumor types less than 5% including gastroesophageal adenocarcinoma.  Discussed with the family the difficulty in teasing out the primary based upon pathology.  # proceed with Frances Nickels TMB]; Labs today reviewed;  acceptable for treatment today.  Discussed that we will plan to have imaging done after 3 cycles of treatment.  # Growth factor-fulphila would be given as prophylaxis for chemotherapy-induced neutropenia to prevent febrile neutropenias. Discussed potential side effect- myalgias/arthralgias- recommend Claritin.    # COPD-post Levaquin/azithromycin; Chest x-ray shows possible lymphangitic spread.  Clinically stable.  # port malfunction: check dye study.  Proceed with IV peripheral chemotherapy today.  #Poor appetite-no weight loss-second underlying malignancy.-Discussed option of Marinol/Megace/prednisone.  For now proceed with evaluation with [facility].   # DISPOSITION:  # referral to [facility]-poor apetite # dye study ASAP # carbo-Keytruda today # d-2 fulphila as planned. #  Follow in 10 days; MD; labs- cbc/bmp; Possible IVFs over 1 hour # follow up in 3 weeks- MD: labs- cbc/cmp/ca-19-9; CEA- carbo-keytruda--Dr.B

## 2021-01-14 NOTE — Progress Notes (Signed)
No blood return from port. Flushes well without resistance or pain.  Per MD , ok to treat peripherally . Will set her up for dye flow study.

## 2021-01-14 NOTE — Progress Notes (Signed)
Patient states she still isnt taking the Decadron as told by dr.b due to blood sugars being high. Patient would like to know do she need to start back taking it. Last dose taken on March 10 after first infusion.  Patient states she has been wheezing for the past 2 weeks.

## 2021-01-14 NOTE — Progress Notes (Signed)
HR 101 ok to proceed per MD

## 2021-01-14 NOTE — Progress Notes (Signed)
[facility] NOTE  Patient Care Team: Adin Hector, MD as PCP - General (Internal Medicine) Telford Nab, RN as Oncology Nurse Navigator Erby Pian, MD as Referring Physician (Specialist) Cammie Sickle, MD as Consulting Physician (Internal Medicine) Noreene Filbert, MD as Referring Physician (Radiation Oncology)  CHIEF COMPLAINTS/PURPOSE OF CONSULTATION: Lung cancer  #  Oncology History Overview Note  #FEB 2021- LUNG, LEFT LOWER LOBE; ENB BRONCHOALVEOLAR LAVAGE:  - SUSPICIOUS FOR MALIGNANCY. 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 LUNG ORIGIN vs cholangio- CA 19-9/CEA > 5000.April 7th, 2022-cancer type ID-90% "probability cervix adenocarcinoma-cannot exclude lung 5%; other tumor types less than 5% including gastroesophageal adenocarcinoma  # NGS/MOLECULAR TESTS: MARCH 2022- PDL-1 TPS=0%; TMB-HIGH; No target**    # PALLIATIVE CARE EVALUATION:  # PAIN MANAGEMENT:    DIAGNOSIS:   STAGE:         ;  GOALS:  CURRENT/MOST RECENT THERAPY :     Primary cancer of left lower lobe of lung ([place])  12/21/2019 Initial Diagnosis   Primary cancer of left lower lobe of lung ([place])   12/04/2020 Cancer Staging   Staging form: Lung, AJCC 8th Edition - Clinical: Stage IVA (cTX, cN2, pM1b) - Signed by Cammie Sickle, MD on 12/04/2020 Histopathologic type: Adenocarcinoma, NOS   12/14/2020 -  Chemotherapy    Patient is on Treatment Plan: LUNG NSCLC KEYTRUDA / CARBOPLATIN Q21D X 1 CYCLES        HISTORY OF PRESENTING ILLNESS:  Donna Brennan 75 y.o.  female prior history of lung cancer; and adenocarcinoma-s/p liver biopsy [?  Recurrent lung cancer versus cholangiocarcinoma] thought clinically lung cancer status post [place] is here for follow-up/also reviewed  the results of the cancer type ID  Patient's chemotherapy cycle #2 was held last week because of bronchitis.  Treated with Z-Pak.  Currently patient's symptoms of cough/shortness of breath is improved.  Symptoms have not resolved.  Wheezing with exertion.  Otherwise no nausea no vomiting.  Review of Systems  Constitutional: Positive for malaise/fatigue. Negative for chills, diaphoresis, fever and weight loss.  HENT: Negative for nosebleeds and sore throat.   Eyes: Negative for double vision.  Respiratory: Positive for cough and shortness of breath. Negative for hemoptysis, sputum production and wheezing.   Cardiovascular: Negative for chest pain, palpitations, orthopnea and leg swelling.  Gastrointestinal: Negative for abdominal pain, blood in stool, constipation, diarrhea, heartburn, melena, nausea and vomiting.  Genitourinary: Negative for dysuria, frequency and urgency.  Musculoskeletal: Positive for back pain and joint pain.  Skin: Negative.  Negative for itching and rash.  Neurological: Negative for dizziness, tingling, focal weakness, weakness and headaches.  Endo/Heme/Allergies: Does not bruise/bleed easily.  Psychiatric/Behavioral: Negative for depression. The patient is not nervous/anxious and does not have insomnia.      MEDICAL HISTORY:  Past Medical History:  Diagnosis Date  . CKD (chronic kidney disease), stage IV ([place])   . Colon polyps   . COPD (chronic obstructive pulmonary disease) ([place])   . Diabetes mellitus without complication ([place])   . GERD (gastroesophageal reflux disease)   . Headache   . Hepatitis    when in 4th or 5th grade.  Resolved  . Hyperlipidemia   . IBS (irritable bowel syndrome)   . Peptic ulcer   . Tobacco abuse   . Vertigo    No "bad" episodes in  over 1 year  . Wears dentures    partial ujpper and lower    SURGICAL HISTORY: Past Surgical History:  Procedure Laterality Date  . ABDOMINAL HYSTERECTOMY    . carbuncle removal     urethra   . CATARACT EXTRACTION W/PHACO Left 10/29/2020   Procedure: CATARACT EXTRACTION PHACO AND INTRAOCULAR LENS PLACEMENT (IOC) LEFT DIABETIC 1051 01:00.0 ;  Surgeon: Eulogio Bear, MD;  Location: [facility];  Service: Ophthalmology;  Laterality: Left;  Diabetic - insulin  . CATARACT EXTRACTION W/PHACO Right 11/19/2020   Procedure: CATARACT EXTRACTION PHACO AND INTRAOCULAR LENS PLACEMENT ([place]) RIGHT DIABETIC;  Surgeon: Eulogio Bear, MD;  Location: [facility];  Service: Ophthalmology;  Laterality: Right;  16.54 1:40.0  . COLONOSCOPY    . COLONOSCOPY WITH PROPOFOL N/A 11/05/2017   Procedure: COLONOSCOPY WITH PROPOFOL;  Surgeon: Lollie Sails, MD;  Location[facility];  Service: Endoscopy;  Laterality: N/A;  . ESOPHAGOGASTRODUODENOSCOPY    . ESOPHAGOGASTRODUODENOSCOPY (EGD) WITH PROPOFOL N/A 11/05/2017   Procedure: ESOPHAGOGASTRODUODENOSCOPY (EGD) WITH PROPOFOL;  Surgeon: Lollie Sails, MD;  Location: [facility] ENDOSCOPY;  Service: Endoscopy;  Laterality: N/A;  . IR IMAGING GUIDED PORT INSERTION  12/13/2020  . VIDEO BRONCHOSCOPY WITH ENDOBRONCHIAL NAVIGATION N/A 12/02/2019   Procedure: VIDEO BRONCHOSCOPY WITH ENDOBRONCHIAL NAVIGATION;  Surgeon: Ottie Glazier, MD;  Location: [facility] ORS;  Service: Thoracic;  Laterality: N/A;  . VIDEO BRONCHOSCOPY WITH ENDOBRONCHIAL ULTRASOUND N/A 12/02/2019   Procedure: VIDEO BRONCHOSCOPY WITH ENDOBRONCHIAL ULTRASOUND;  Surgeon: Ottie Glazier, MD;  Location: [facility] ORS;  Service: Thoracic;  Laterality: N/A;    SOCIAL HISTORY: Social History   Socioeconomic History  . Marital status: Married    Spouse name: wayne  . Number of children: 6  . Years of education: Not on file  . Highest education level: Not on file  Occupational History  . Not on file  Tobacco Use  . Smoking status: Current Every Day Smoker    Packs/day: 0.25    Years: 45.00    Pack years: 11.25    Types: Cigarettes    Last attempt to quit: 08/06/2020    Years  since quitting: 0.4  . Smokeless tobacco: Never Used  Vaping Use  . Vaping Use: Never used  Substance and Sexual Activity  . Alcohol use: No  . Drug use: No  . Sexual activity: Not on file  Other Topics Concern  . 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 [place].     Social Determinants of Health   Financial Resource Strain: Not on file  Food Insecurity: Not on file  Transportation Needs: Not on file  Physical Activity: Not on file  Stress: Not on file  Social Connections: Not on file  Intimate Partner Violence: Not on file    FAMILY HISTORY: Family History  Problem Relation Age of Onset  . Breast cancer Neg Hx     ALLERGIES:  is allergic to penicillins, metformin, and sulfa antibiotics.  MEDICATIONS:  Current Outpatient Medications  Medication Sig Dispense Refill  . acetaminophen (TYLENOL) 500 MG tablet Take 1,000 mg by mouth every 6 (six) hours as needed (for pain.).    Marland Kitchen albuterol (VENTOLIN HFA) 108 (90 Base) MCG/ACT inhaler Inhale 1-2 puffs into the lungs every 6 (six) hours as needed for wheezing or shortness of breath. 8 g 1  . azithromycin (ZITHROMAX Z-PAK) 250 MG tablet Take 2 pills day; and then  one a day. 6 each 0  . diazepam (VALIUM) 5 MG tablet Take 5 mg by mouth daily as needed (for vertigo).    . ezetimibe (ZETIA) 10 MG tablet Take 10 mg by mouth daily.     . Fluticasone-Salmeterol (ADVAIR) 250-50 MCG/DOSE AEPB Inhale 1 puff into the lungs 2 (two) times daily.    . folic acid (FOLVITE) 1 MG tablet Take 1 tablet (1 mg total) by mouth daily. 90 tablet 1  . furosemide (LASIX) 20 MG tablet Take 20 mg by mouth daily as needed (fluid retention.).    Marland Kitchen hyoscyamine (LEVBID) 0.375 MG 12 hr tablet Take 0.375 mg by mouth daily as needed (abdominal cramping).     . Ipratropium-Albuterol (COMBIVENT) 20-100 MCG/ACT AERS respimat Inhale 1 puff into the lungs every 4  (four) hours as needed for wheezing.    Marland Kitchen ipratropium-albuterol (DUONEB) 0.5-2.5 (3) MG/3ML SOLN Take 3 mLs by nebulization every 6 (six) hours as needed (wheezing). 360 mL 0  . LANTUS SOLOSTAR 100 UNIT/ML Solostar Pen Inject 12-20 Units into the skin 2 (two) times daily. 12U in AM, 20U in PM    . lidocaine-prilocaine (EMLA) cream Apply 1 application topically as needed. 30 g 3  . Multiple Vitamin (MULTIVITAMIN WITH MINERALS) TABS tablet Take 1 tablet by mouth daily. One-A-Day for Women    . nystatin (MYCOSTATIN) 100000 UNIT/ML suspension Take 5 mLs by mouth 2 (two) times daily as needed (throat irritation.).    Marland Kitchen omeprazole (PRILOSEC) 20 MG capsule Take 20 mg by mouth daily before breakfast.     . ondansetron (ZOFRAN) 8 MG tablet Take 1 tablet (8 mg total) by mouth every 8 (eight) hours as needed for nausea or vomiting. 20 tablet 3  . prochlorperazine (COMPAZINE) 10 MG tablet Take 1 tablet (10 mg total) by mouth every 6 (six) hours as needed for nausea or vomiting. 30 tablet 3  . rosuvastatin (CRESTOR) 40 MG tablet Take 40 mg by mouth every evening.     . vitamin B-12 (CYANOCOBALAMIN) 1000 MCG tablet Take 1,000 mcg by mouth daily.    . chlorpheniramine-HYDROcodone (TUSSIONEX) 10-8 MG/5ML SUER Take 5 mLs by mouth at bedtime as needed for cough. 200 mL 0  . dexamethasone (DECADRON) 4 MG tablet Take one pill AM & PM for 2 days. TAKE day prior & day AFTER chemo. Do NOT take on day of chemo. (Patient not taking: No sig reported) 60 tablet 0   No current facility-administered medications for this visit.   Facility-Administered Medications Ordered in Other Visits  Medication Dose Route Frequency Provider Last Rate Last Admin  . CARBOplatin (PARAPLATIN) 340 mg in sodium chloride 0.9 % 250 mL chemo infusion  340 mg Intravenous Once Cammie Sickle, MD 568 mL/hr at 01/14/21 1254 340 mg at 01/14/21 1254  . sodium chloride flush (NS) 0.9 % injection 10 mL  10 mL Intravenous PRN Cammie Sickle,  MD   10 mL at 12/24/20 0842  . sodium chloride flush (NS) 0.9 % injection 10 mL  10 mL Intravenous PRN Verlon Au, NP   10 mL at 12/28/20 1415      .  PHYSICAL EXAMINATION: ECOG PERFORMANCE STATUS: 0 - Asymptomatic  Vitals:   01/14/21 0955  BP: 124/69  Pulse: (!) 101  Resp: 20  Temp: 97.8 F (36.6 C)  SpO2: 100%   Filed Weights   01/14/21 0955  Weight: 131 lb (59.4 kg)    Physical Exam Constitutional:      Comments:  Thin built Caucasian female patient.  She is ambulating independently.  Accompanied by daughter.  HENT:     Head: Normocephalic and atraumatic.     Mouth/Throat:     Pharynx: No oropharyngeal exudate.  Eyes:     Pupils: Pupils are equal, round, and reactive to light.  Cardiovascular:     Rate and Rhythm: Normal rate and regular rhythm.  Pulmonary:     Effort: No respiratory distress.     Breath sounds: No wheezing.     Comments: Decreased air entry bilaterally.  No wheeze or crackles. Abdominal:     General: Bowel sounds are normal. There is no distension.     Palpations: Abdomen is soft. There is no mass.     Tenderness: There is no abdominal tenderness. There is no guarding or rebound.  Musculoskeletal:        General: No tenderness. Normal range of motion.     Cervical back: Normal range of motion and neck supple.  Skin:    General: Skin is warm.  Neurological:     Mental Status: She is alert and oriented to person, place, and time.  Psychiatric:        Mood and Affect: Affect normal.    LABORATORY DATA:  I have reviewed the data as listed Lab Results  Component Value Date   WBC 7.5 01/14/2021   HGB 10.2 (L) 01/14/2021   HCT 30.5 (L) 01/14/2021   MCV 92.4 01/14/2021   PLT 486 (H) 01/14/2021   Recent Labs    12/28/20 1415 01/07/21 0825 01/14/21 0933  NA 133* 133* 134*  K 4.5 4.2 4.1  CL 101 99 102  CO2 21* 23 22  GLUCOSE 132* 258* 106*  BUN _0 CREATININE 1.02* 1.01* 1.08*  CALCIUM 8.6* 8.7* 8.1*  GFRNONAA 57* 58*  53*  PROT 6.9 7.0 7.1  ALBUMIN 2.7* 2.6* 2.2*  AST _1 ALT _2 ALKPHOS 78 79 75  BILITOT 0.8 0.4 0.6    RADIOGRAPHIC STUDIES: I have personally reviewed the radiological images as listed and agreed with the findings in the report. DG Chest 2 View  Result Date: 12/28/2020 CLINICAL DATA:  Cough EXAM: CHEST - 2 VIEW COMPARISON:  11/06/2020 FINDINGS: Right IJ approach Port-A-Cath terminates at the level of the superior cavoatrial junction. Heart size within normal limits. Atherosclerotic calcification of the aortic knob. Nodular pleural thickening within the oblique fissure at the left mid lung. Mild streaky left basilar opacities, similar to prior. Right lung appears clear. No pleural effusion or pneumothorax. Osseous structures appear intact. IMPRESSION: 1. Nodular pleural thickening within the oblique fissure at the left mid lung. Mild streaky left basilar opacities, similar to prior and remains compatible with lymphangitis carcinomatosis. 2. No new or acute findings. Electronically Signed   By: Davina Poke D.O.   On: 12/28/2020 12:46   MR Brain W Wo Contrast  Result Date: 12/29/2020 CLINICAL DATA:  Non-small cell lung cancer. EXAM: MRI HEAD WITHOUT AND WITH CONTRAST TECHNIQUE: Multiplanar, multiecho pulse sequences of the brain and surrounding structures were obtained without and with intravenous contrast. CONTRAST:  53m GADAVIST GADOBUTROL 1 MMOL/ML IV SOLN COMPARISON:  None. FINDINGS: Brain: Postcontrast images demonstrate no pathologic enhancement. A punctate focus of cortical restricted diffusion is present in the posterior right frontal lobe on image 38 of series 5. Focal T2 signal is associated. Encephalomalacia involving the anterior right frontal lobe and right occipital pole is most consistent with remote infarcts.  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 Multiple lacunar infarcts are present in both  cerebellar hemispheres. Brainstem is unremarkable. Volume loss associated with the right-sided infarcts is cyst seated with ex vacuo dilation of the right lateral ventricle. Smaller remote left frontal lobe infarct involves the operculum. No significant extra-axial fluid collection is present. The internal auditory canals are within normal limits. Vascular: Flow is present in the major intracranial arteries. Skull and upper cervical spine: The craniocervical junction is normal. Upper cervical spine is within normal limits. Marrow signal is unremarkable. Sinuses/Orbits: Mild mucosal thickening is present in the left maxillary sinus The paranasal sinuses and mastoid air cells are otherwise clear. Bilateral lens replacements are noted. Globes and orbits are otherwise unremarkable. IMPRESSION: 1. No evidence for metastatic disease to the brain. 2. Punctate focus of cortical restricted diffusion in the posterior right frontal lobe compatible with a small acute/subacute infarct. 3. 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 4. Remote infarcts involving the cerebellum bilaterally. 5. Mild left maxillary sinus disease. Electronically Signed   By: San Morelle M.D.   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  CEA elevated/CA 19-9 elevated [>5800]; NGS no targetable mutation; except TMB-H.  April 7th, 2022-cancer type ID-90% "probability cervix adenocarcinoma-cannot exclude lung 5%; other tumor types less than 5% including gastroesophageal adenocarcinoma.  Discussed with the family the difficulty in teasing out the primary based upon pathology.  # proceed with Frances Nickels TMB]; Labs today reviewed;  acceptable for treatment today.  Discussed that we will plan  to have imaging done after 3 cycles of treatment.  # Growth factor-fulphila would be given as prophylaxis for chemotherapy-induced neutropenia to prevent febrile neutropenias. Discussed potential side effect- myalgias/arthralgias- recommend Claritin.    # COPD-post Levaquin/azithromycin; Chest x-ray shows possible lymphangitic spread.  Clinically stable.  # port malfunction: check dye study.  Proceed with IV peripheral chemotherapy today.  #Poor appetite-no weight loss-second underlying malignancy.-Discussed option of Marinol/Megace/prednisone.  For now proceed with evaluation with [facility].   # DISPOSITION:  # referral to [facility]-poor apetite # dye study ASAP # carbo-Keytruda today # d-2 fulphila as planned. #  Follow in 10 days; MD; labs- cbc/bmp; Possible IVFs over 1 hour # follow up in 3 weeks- MD: labs- cbc/cmp/ca-19-9; CEA- carbo-keytruda--Dr.B    All questions were answered. The patient knows to call the clinic with any problems, questions or concerns.    Cammie Sickle, MD 01/14/2021 1:18 PM

## 2021-01-15 ENCOUNTER — Inpatient Hospital Stay: Payer: Medicare Other

## 2021-01-15 ENCOUNTER — Encounter: Payer: Self-pay | Admitting: Internal Medicine

## 2021-01-15 DIAGNOSIS — Z5112 Encounter for antineoplastic immunotherapy: Secondary | ICD-10-CM | POA: Diagnosis not present

## 2021-01-15 DIAGNOSIS — C3432 Malignant neoplasm of lower lobe, left bronchus or lung: Secondary | ICD-10-CM

## 2021-01-15 LAB — CEA: CEA: 6049 ng/mL — ABNORMAL HIGH (ref 0.0–4.7)

## 2021-01-15 MED ORDER — PEGFILGRASTIM-JMDB 6 MG/0.6ML ~~LOC~~ SOSY
6.0000 mg | PREFILLED_SYRINGE | Freq: Once | SUBCUTANEOUS | Status: AC
  Administered 2021-01-15: 6 mg via SUBCUTANEOUS
  Filled 2021-01-15: qty 0.6

## 2021-01-16 ENCOUNTER — Other Ambulatory Visit: Payer: Self-pay

## 2021-01-16 ENCOUNTER — Ambulatory Visit
Admission: RE | Admit: 2021-01-16 | Discharge: 2021-01-16 | Disposition: A | Discharge status: Home / Self Care | Payer: Medicare Other | Source: Ambulatory Visit | Attending: Internal Medicine | Admitting: Internal Medicine

## 2021-01-16 DIAGNOSIS — C3432 Malignant neoplasm of lower lobe, left bronchus or lung: Secondary | ICD-10-CM | POA: Insufficient documentation

## 2021-01-16 DIAGNOSIS — Z95828 Presence of other vascular implants and grafts: Secondary | ICD-10-CM | POA: Diagnosis not present

## 2021-01-16 LAB — CA 19-9 (SERIAL): CA 19-9: 3683 U/mL — ABNORMAL HIGH (ref 0–35)

## 2021-01-16 MED ORDER — HEPARIN SOD (PORK) LOCK FLUSH 100 UNIT/ML IV SOLN
INTRAVENOUS | Status: AC
  Administered 2021-01-16: 500 [IU]
  Filled 2021-01-16: qty 5

## 2021-01-16 MED ORDER — IOHEXOL 180 MG/ML  SOLN
20.0000 mL | Freq: Once | INTRAMUSCULAR | Status: AC | PRN
  Administered 2021-01-16: 20 mL

## 2021-01-16 NOTE — OR Nursing (Signed)
Port cath accessed with 48 G needle for dye study. Needle removed after flushed with NS and Heparin, bandaid applied, site unremarkable.

## 2021-01-17 ENCOUNTER — Other Ambulatory Visit: Payer: Self-pay | Admitting: *Deleted

## 2021-01-17 ENCOUNTER — Telehealth: Payer: Self-pay | Admitting: Internal Medicine

## 2021-01-17 NOTE — Telephone Encounter (Signed)
On 4/13-I tried to reach patient's daughter [name]; unable to leave a voicemail.  Heather-please inform the daughter of the issue with the Mediport; continue to revise it.  Please check with IR.   Thanks GB

## 2021-01-17 NOTE — Telephone Encounter (Signed)
Spoke with Olegario Shearer, RN - patient has already had a dye study. Port will need to be revised/adjusted. Olegario Shearer will contact our team tomorrow with new apts for port revision.

## 2021-01-17 NOTE — Telephone Encounter (Signed)
I sent a message to radiology nurse and scheduling team yesterday to see when this could be arranged.  [place]

## 2021-01-18 NOTE — Telephone Encounter (Signed)
Donna Brennan was this appointment made or do I still need to sent another worksheet for port placement? I am not sure what was done with this yesterday. Let me know and I will get it taken care of on Monday as soon as possible.

## 2021-01-21 ENCOUNTER — Other Ambulatory Visit: Payer: Self-pay | Admitting: *Deleted

## 2021-01-21 DIAGNOSIS — C3432 Malignant neoplasm of lower lobe, left bronchus or lung: Secondary | ICD-10-CM

## 2021-01-21 DIAGNOSIS — Z95828 Presence of other vascular implants and grafts: Secondary | ICD-10-CM

## 2021-01-22 ENCOUNTER — Telehealth: Payer: Self-pay

## 2021-01-22 NOTE — Telephone Encounter (Signed)
Scheduled for IR Port Placement/Revision Fri 01/25/21 @ 9:30a Arrive @ 8:30a  -Patient is aware of appointment and expressed understanding. Will cancel appointments for 4/20 for labs and infusion.

## 2021-01-23 ENCOUNTER — Inpatient Hospital Stay: Payer: Medicare Other

## 2021-01-23 NOTE — Progress Notes (Signed)
Patient on schedule for potential PORT revision 4/22,called and spoke with patient on phone. Made aware to only take 1/2 dose of insulin hs prior and no insulin dose day of procedure. Only sip water with meds day of , NPO after MN,and driver post procedure./discharge./stated understanding.

## 2021-01-24 ENCOUNTER — Other Ambulatory Visit: Payer: Self-pay | Admitting: Radiology

## 2021-01-25 ENCOUNTER — Other Ambulatory Visit: Payer: Self-pay

## 2021-01-25 ENCOUNTER — Ambulatory Visit
Admission: RE | Admit: 2021-01-25 | Discharge: 2021-01-25 | Disposition: A | Discharge status: Home / Self Care | Payer: Medicare Other | Source: Ambulatory Visit | Attending: Internal Medicine | Admitting: Internal Medicine

## 2021-01-25 DIAGNOSIS — Z880 Allergy status to penicillin: Secondary | ICD-10-CM | POA: Insufficient documentation

## 2021-01-25 DIAGNOSIS — Z794 Long term (current) use of insulin: Secondary | ICD-10-CM | POA: Insufficient documentation

## 2021-01-25 DIAGNOSIS — F17210 Nicotine dependence, cigarettes, uncomplicated: Secondary | ICD-10-CM | POA: Diagnosis not present

## 2021-01-25 DIAGNOSIS — Z79899 Other long term (current) drug therapy: Secondary | ICD-10-CM | POA: Diagnosis not present

## 2021-01-25 DIAGNOSIS — Z888 Allergy status to other drugs, medicaments and biological substances status: Secondary | ICD-10-CM | POA: Diagnosis not present

## 2021-01-25 DIAGNOSIS — Z452 Encounter for adjustment and management of vascular access device: Secondary | ICD-10-CM | POA: Insufficient documentation

## 2021-01-25 DIAGNOSIS — Z882 Allergy status to sulfonamides status: Secondary | ICD-10-CM | POA: Diagnosis not present

## 2021-01-25 DIAGNOSIS — C3432 Malignant neoplasm of lower lobe, left bronchus or lung: Secondary | ICD-10-CM | POA: Insufficient documentation

## 2021-01-25 DIAGNOSIS — Z95828 Presence of other vascular implants and grafts: Secondary | ICD-10-CM

## 2021-01-25 HISTORY — PX: IR IMAGING GUIDED PORT INSERTION: IMG5740

## 2021-01-25 LAB — GLUCOSE, CAPILLARY: Glucose-Capillary: 133 mg/dL — ABNORMAL HIGH (ref 70–99)

## 2021-01-25 MED ORDER — MIDAZOLAM HCL 2 MG/2ML IJ SOLN
INTRAMUSCULAR | Status: AC
  Filled 2021-01-25: qty 2

## 2021-01-25 MED ORDER — HEPARIN SOD (PORK) LOCK FLUSH 100 UNIT/ML IV SOLN
INTRAVENOUS | Status: AC
  Filled 2021-01-25: qty 5

## 2021-01-25 MED ORDER — SODIUM CHLORIDE 0.9 % IV SOLN: INTRAVENOUS | Status: DC

## 2021-01-25 MED ORDER — VANCOMYCIN HCL IN DEXTROSE 1-5 GM/200ML-% IV SOLN
1000.0000 mg | INTRAVENOUS | Status: DC
  Filled 2021-01-25: qty 200

## 2021-01-25 MED ORDER — FENTANYL CITRATE (PF) 100 MCG/2ML IJ SOLN
INTRAMUSCULAR | Status: AC
  Filled 2021-01-25: qty 2

## 2021-01-25 MED ORDER — MIDAZOLAM HCL 2 MG/2ML IJ SOLN
INTRAMUSCULAR | Status: AC | PRN
  Administered 2021-01-25: 1 mg via INTRAVENOUS
  Administered 2021-01-25 (×2): 0.5 mg via INTRAVENOUS

## 2021-01-25 MED ORDER — VANCOMYCIN HCL 500 MG/100ML IV SOLN
INTRAVENOUS | Status: AC | PRN
  Administered 2021-01-25: 1000 mg via INTRAVENOUS

## 2021-01-25 MED ORDER — FENTANYL CITRATE (PF) 100 MCG/2ML IJ SOLN
INTRAMUSCULAR | Status: AC | PRN
  Administered 2021-01-25: 25 ug via INTRAVENOUS
  Administered 2021-01-25: 50 ug via INTRAVENOUS
  Administered 2021-01-25: 25 ug via INTRAVENOUS
  Administered 2021-01-25: 50 ug via INTRAVENOUS

## 2021-01-25 NOTE — H&P (Signed)
Chief Complaint: Patient was seen in consultation today for malfunctioning Port-A-Cath  Referring Physician(s): Cammie Sickle  Supervising Physician: Aletta Edouard  Patient Status: [facility] - Out-pt  History of Present Illness: Donna Brennan is a 75 y.o. female with metastatic lung cancer known to Radiology from liver lesion biopsy 11/28/20 with Dr. Earleen Newport and Port-A-Cath placement with Dr. Annamaria Boots 12/13/20.  Her chemotherapy was delayed due to ongoing respiratory symptoms and Port was flushed in the interim.  At last [facility] check at the cancer, RN was unable to aspirate. Port injection 01/16/21 shows well-positioned Port without thrombus or fibrin sheath, however cancer center unable to utilize. Patient returns to [facility] Radiology today for Port-A-Cath revision.   Donna Brennan presents today in her usual state of health.  She denies fever, chills, cough, shortness of breath, abdominal pain, nausea, vomiting.  She has been NPO this AM.   Past Medical History:  Diagnosis Date  . CKD (chronic kidney disease), stage IV ([place])   . Colon polyps   . COPD (chronic obstructive pulmonary disease) ([place])   . Diabetes mellitus without complication ([place])   . GERD (gastroesophageal reflux disease)   . Headache   . Hepatitis    when in 4th or 5th grade.  Resolved  . Hyperlipidemia   . IBS (irritable bowel syndrome)   . Peptic ulcer   . Tobacco abuse   . Vertigo    No "bad" episodes in over 1 year  . Wears dentures    partial ujpper and lower    Past Surgical History:  Procedure Laterality Date  . ABDOMINAL HYSTERECTOMY    . carbuncle removal     urethra  . CATARACT EXTRACTION W/PHACO Left 10/29/2020   Procedure: CATARACT EXTRACTION PHACO AND INTRAOCULAR LENS PLACEMENT (IOC) LEFT DIABETIC 1051 01:00.0 ;  Surgeon: Eulogio Bear, MD;  Location: [place];  Service: Ophthalmology;  Laterality: Left;  Diabetic - insulin  . CATARACT EXTRACTION W/PHACO Right 11/19/2020    Procedure: CATARACT EXTRACTION PHACO AND INTRAOCULAR LENS PLACEMENT ([place]) RIGHT DIABETIC;  Surgeon: Eulogio Bear, MD;  Location: [place];  Service: Ophthalmology;  Laterality: Right;  16.54 1:40.0  . COLONOSCOPY    . COLONOSCOPY WITH PROPOFOL N/A 11/05/2017   Procedure: COLONOSCOPY WITH PROPOFOL;  Surgeon: Lollie Sails, MD;  Location: [facility] ENDOSCOPY;  Service: Endoscopy;  Laterality: N/A;  . ESOPHAGOGASTRODUODENOSCOPY    . ESOPHAGOGASTRODUODENOSCOPY (EGD) WITH PROPOFOL N/A 11/05/2017   Procedure: ESOPHAGOGASTRODUODENOSCOPY (EGD) WITH PROPOFOL;  Surgeon: Lollie Sails, MD;  Location: [facility]COPY;  Service: Endoscopy;  Laterality: N/A;  . IR IMAGING GUIDED PORT INSERTION  12/13/2020  . VIDEO BRONCHOSCOPY WITH ENDOBRONCHIAL NAVIGATION N/A 12/02/2019   Procedure: VIDEO BRONCHOSCOPY WITH ENDOBRONCHIAL NAVIGATION;  Surgeon: Ottie Glazier, MD;  Location: [facility] ORS;  Service: Thoracic;  Laterality: N/A;  . VIDEO BRONCHOSCOPY WITH ENDOBRONCHIAL ULTRASOUND N/A 12/02/2019   Procedure: VIDEO BRONCHOSCOPY WITH ENDOBRONCHIAL ULTRASOUND;  Surgeon: Ottie Glazier, MD;  Location: [facility] ORS;  Service: Thoracic;  Laterality: N/A;    Allergies: Penicillins, Metformin, and Sulfa antibiotics  Medications: Prior to Admission medications   Medication Sig Start Date End Date Taking? Authorizing Provider  acetaminophen (TYLENOL) 500 MG tablet Take 1,000 mg by mouth every 6 (six) hours as needed (for pain.).    [provider]  albuterol (VENTOLIN HFA) 108 (90 Base) MCG/ACT inhaler Inhale 1-2 puffs into the lungs every 6 (six) hours as needed for wheezing or shortness of breath. 02/10/20   Cammie Sickle, MD  azithromycin (ZITHROMAX Z-PAK) 250 MG tablet Take 2 pills day; and then one a day. 01/07/21   Cammie Sickle, MD  chlorpheniramine-HYDROcodone (TUSSIONEX) 10-8 MG/5ML SUER Take 5 mLs by mouth at bedtime as needed for cough. 01/14/21   Cammie Sickle, MD   dexamethasone (DECADRON) 4 MG tablet Take one pill AM & PM for 2 days. TAKE day prior & day AFTER chemo. Do NOT take on day of chemo. Patient not taking: No sig reported 12/05/20   Cammie Sickle, MD  diazepam (VALIUM) 5 MG tablet Take 5 mg by mouth daily as needed (for vertigo).    [provider]  ezetimibe (ZETIA) 10 MG tablet Take 10 mg by mouth daily.     [provider]  Fluticasone-Salmeterol (ADVAIR) 250-50 MCG/DOSE AEPB Inhale 1 puff into the lungs 2 (two) times daily.    [provider]  folic acid (FOLVITE) 1 MG tablet Take 1 tablet (1 mg total) by mouth daily. 12/14/20   Cammie Sickle, MD  furosemide (LASIX) 20 MG tablet Take 20 mg by mouth daily as needed (fluid retention.).    [provider]  hyoscyamine (LEVBID) 0.375 MG 12 hr tablet Take 0.375 mg by mouth daily as needed (abdominal cramping).  03/24/19   [provider]  Ipratropium-Albuterol (COMBIVENT) 20-100 MCG/ACT AERS respimat Inhale 1 puff into the lungs every 4 (four) hours as needed for wheezing.    [provider]  ipratropium-albuterol (DUONEB) 0.5-2.5 (3) MG/3ML SOLN Take 3 mLs by nebulization every 6 (six) hours as needed (wheezing). 12/28/20   Verlon Au, NP  LANTUS SOLOSTAR 100 UNIT/ML Solostar Pen Inject 12-20 Units into the skin 2 (two) times daily. 12U in AM, 20U in PM 10/25/19   [provider]  lidocaine-prilocaine (EMLA) cream Apply 1 application topically as needed. 12/05/20   Cammie Sickle, MD  Multiple Vitamin (MULTIVITAMIN WITH MINERALS) TABS tablet Take 1 tablet by mouth daily. One-A-Day for Women    [provider]  nystatin (MYCOSTATIN) 100000 UNIT/ML suspension Take 5 mLs by mouth 2 (two) times daily as needed (throat irritation.).    [provider]  omeprazole (PRILOSEC) 20 MG capsule Take 20 mg by mouth daily before breakfast.     [provider]  ondansetron (ZOFRAN) 8 MG tablet Take 1 tablet  (8 mg total) by mouth every 8 (eight) hours as needed for nausea or vomiting. 12/05/20   Cammie Sickle, MD  prochlorperazine (COMPAZINE) 10 MG tablet Take 1 tablet (10 mg total) by mouth every 6 (six) hours as needed for nausea or vomiting. 12/05/20   Cammie Sickle, MD  rosuvastatin (CRESTOR) 40 MG tablet Take 40 mg by mouth every evening.     [provider]  vitamin B-12 (CYANOCOBALAMIN) 1000 MCG tablet Take 1,000 mcg by mouth daily.    [provider]     Family History  Problem Relation Age of Onset  . Breast cancer Neg Hx     Social History   Socioeconomic History  . Marital status: Married    Spouse name: wayne  . Number of children: 6  . Years of education: Not on file  . Highest education level: Not on file  Occupational History  . Not on file  Tobacco Use  . Smoking status: Current Every Day Smoker    Packs/day: 0.25    Years: 45.00    Pack years: 11.25    Types: Cigarettes    Last attempt to quit:  08/06/2020    Years since quitting: 0.4  . Smokeless tobacco: Never Used  Vaping Use  . Vaping Use: Never used  Substance and Sexual Activity  . Alcohol use: No  . Drug use: No  . Sexual activity: Not on file  Other Topics Concern  . 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 lives in [place].     Social Determinants of Health   Financial Resource Strain: Not on file  Food Insecurity: Not on file  Transportation Needs: Not on file  Physical Activity: Not on file  Stress: Not on file  Social Connections: Not on file     Review of Systems: A 12 point ROS discussed and pertinent positives are indicated in the HPI above.  All other systems are negative.  Review of Systems  Constitutional: Negative for fatigue and fever.  Respiratory: Negative for cough and shortness of breath.   Cardiovascular: Negative for chest pain.  Gastrointestinal:  Negative for abdominal pain, nausea and vomiting.  Genitourinary: Negative for dysuria.  Musculoskeletal: Negative for back pain.  Psychiatric/Behavioral: Negative for behavioral problems and confusion.    Vital Signs: BP 112/67   Pulse 99   Temp 98 F (36.7 C) (Oral)   Resp 16   Ht 5' 1.5" (1.562 m)   Wt 133 lb (60.3 kg)   SpO2 100%   BMI 24.72 kg/m   Physical Exam Vitals and nursing note reviewed.  Constitutional:      General: She is not in acute distress.    Appearance: Normal appearance. She is not ill-appearing.  HENT:     Mouth/Throat:     Mouth: Mucous membranes are moist.     Pharynx: Oropharynx is clear.  Neck:     Comments: Right Port-A-Cath in place.  Well-healed, scarred.  Cardiovascular:     Rate and Rhythm: Normal rate and regular rhythm.  Pulmonary:     Effort: Pulmonary effort is normal. No respiratory distress.     Breath sounds: Normal breath sounds.  Abdominal:     General: Abdomen is flat.     Palpations: Abdomen is soft.  Skin:    General: Skin is warm and dry.  Neurological:     General: No focal deficit present.     Mental Status: She is alert and oriented to person, place, and time. Mental status is at baseline.  Psychiatric:        Mood and Affect: Mood normal.        Behavior: Behavior normal.        Thought Content: Thought content normal.        Judgment: Judgment normal.      MD Evaluation Airway: WNL Heart: WNL Abdomen: WNL Chest/ Lungs: WNL ASA  Classification: 3 Mallampati/Airway Score: Two   Imaging: DG Chest 2 View  Result Date: 12/28/2020 CLINICAL DATA:  Cough EXAM: CHEST - 2 VIEW COMPARISON:  11/06/2020 FINDINGS: Right IJ approach Port-A-Cath terminates at the level of the superior cavoatrial junction. Heart size within normal limits. Atherosclerotic calcification of the aortic knob. Nodular pleural thickening within the oblique fissure at the left mid lung. Mild streaky left basilar opacities, similar to prior. Right  lung appears clear. No pleural effusion or pneumothorax. Osseous structures appear intact. IMPRESSION: 1. Nodular pleural thickening within the oblique fissure at the left mid lung. Mild streaky left basilar opacities, similar to prior and remains compatible with lymphangitis carcinomatosis. 2. No  new or acute findings. Electronically Signed   By: Davina Poke D.O.   On: 12/28/2020 12:46   MR Brain W Wo Contrast  Result Date: 12/29/2020 CLINICAL DATA:  Non-small cell lung cancer. EXAM: MRI HEAD WITHOUT AND WITH CONTRAST TECHNIQUE: Multiplanar, multiecho pulse sequences of the brain and surrounding structures were obtained without and with intravenous contrast. CONTRAST:  50mL GADAVIST GADOBUTROL 1 MMOL/ML IV SOLN COMPARISON:  None. FINDINGS: Brain: Postcontrast images demonstrate no pathologic enhancement. A punctate focus of cortical restricted diffusion is present in the posterior right frontal lobe on image 38 of series 5. Focal T2 signal is associated. Encephalomalacia involving the anterior right frontal lobe and right occipital pole is most consistent with remote infarcts. 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 Multiple lacunar infarcts are present in both cerebellar hemispheres. Brainstem is unremarkable. Volume loss associated with the right-sided infarcts is cyst seated with ex vacuo dilation of the right lateral ventricle. Smaller remote left frontal lobe infarct involves the operculum. No significant extra-axial fluid collection is present. The internal auditory canals are within normal limits. Vascular: Flow is present in the major intracranial arteries. Skull and upper cervical spine: The craniocervical junction is normal. Upper cervical spine is within normal limits. Marrow signal is unremarkable. Sinuses/Orbits: Mild mucosal thickening is present in the left maxillary sinus The paranasal sinuses and mastoid air cells  are otherwise clear. Bilateral lens replacements are noted. Globes and orbits are otherwise unremarkable. IMPRESSION: 1. No evidence for metastatic disease to the brain. 2. Punctate focus of cortical restricted diffusion in the posterior right frontal lobe compatible with a small acute/subacute infarct. 3. 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 4. Remote infarcts involving the cerebellum bilaterally. 5. Mild left maxillary sinus disease. Electronically Signed   By: San Morelle M.D.   On: 12/29/2020 06:40   DG Fluoro Guide CV Line Right  Result Date: 01/16/2021 INDICATION: Inability to withdraw blood from right chest wall port EXAM: RIGHT CHEST PORT INJECTION MEDICATIONS: None ANESTHESIA/SEDATION: None FLUOROSCOPY TIME:  Fluoroscopy Time: 24 seconds, 62.9 mGy dose COMPLICATIONS: None immediate. PROCEDURE: Informed written consent was obtained from the patient after a thorough discussion of the procedural risks, benefits and alternatives. A timeout was performed prior to the initiation of the procedure. The right chest wall port had already been accessed. The port was then injected with Omnipaque 180 which shows the port reservoir to be widely patent. The port catheter is patent as well with free flow of contrast material into the right atrium. Catheter tip appears deep in the right atrium and when negative pressure was applied no significant blood return is noted likely related to the tip of the catheter contacting the atrial wall. The catheter tip position appears lower than that seen on recent chest x-ray from 12/28/2020 likely related in part to the supine positioning. IMPRESSION: Patent catheter without evidence of thrombus or fibrin sheath. No blood draw was obtained due to the catheter tip extending into the right atrium. This is lower than that seen on recent chest x-ray. Upright positioning may be helpful to obtain blood return as well as possible  downward traction of the port reservoir prior to access. Electronically Signed   By: Inez Catalina M.D.   On: 01/16/2021 12:33    Labs:  CBC: Recent Labs    12/24/20 0842 12/28/20 1415 01/07/21 0825 01/14/21 0933  WBC 2.0* 2.9* 12.7* 7.5  HGB 10.5*  10.9* 11.9* 10.2*  HCT 31.8* 33.1* 36.1 30.5*  PLT 94* 160 501* 486*    COAGS: Recent Labs    11/28/20 1008  INR 1.0    BMP: Recent Labs    12/24/20 0842 12/28/20 1415 01/07/21 0825 01/14/21 0933  NA 134* 133* 133* 134*  K 4.4 4.5 4.2 4.1  CL 103 101 99 102  CO2 21* 21* 23 22  GLUCOSE 297* 132* 258* 106*  BUN 29* 14 20 14   CALCIUM 8.2* 8.6* 8.7* 8.1*  CREATININE 0.94 1.02* 1.01* 1.08*  GFRNONAA >60 57* 58* 53*    LIVER FUNCTION TESTS: Recent Labs    12/24/20 0842 12/28/20 1415 01/07/21 0825 01/14/21 0933  BILITOT 0.4 0.8 0.4 0.6  AST 22 19 20 24   ALT 25 21 19 18   ALKPHOS 66 78 79 75  PROT 6.6 6.9 7.0 7.1  ALBUMIN 2.6* 2.7* 2.6* 2.2*    TUMOR MARKERS: No results for input(s): AFPTM, CEA, CA199, CHROMGRNA in the last 8760 hours.  Assessment and Plan: Patient with past medical history of metastatic lung cancer presents with complaint of malfunctioning Port.  IR consulted for [facility] revision at the request of Dr. Rogue Bussing. Case reviewed by Dr. Kathlene Cote who approves patient for procedure.  Patient presents today in their usual state of health.  She has been NPO and is not currently on blood thinners.   Risks and benefits of image guided port-a-catheter placement was discussed with the patient including, but not limited to bleeding, infection, pneumothorax, or fibrin sheath development and need for additional procedures.  All of the patient's questions were answered, patient is agreeable to proceed. Consent signed and in chart.   Thank you for this interesting consult.  I greatly enjoyed meeting Donna Brennan and look forward to participating in their care.  A copy of this report was sent to the  requesting provider on this date.  Electronically Signed: Docia Barrier, PA 01/25/2021, 9:53 AM   I spent a total of    25 Minutes in face to face in clinical consultation, greater than 50% of which was counseling/coordinating care for [facility] malfunction, lung cancer.

## 2021-01-25 NOTE — Procedures (Signed)
Interventional Radiology Procedure Note  Procedure: Single Lumen Power Port Placement after removal of old port    Access:  Right IJ vein. New access.  Findings: Previously placed port removed after establishing new right IJ access. New port pocket made inferior to old pocket.  New SL Power Port placed. Catheter tip positioned at SVC/RA junction. Port is ready for immediate use.   Complications: None  EBL: < 10 mL  Recommendations:  - Ok to shower in 24 hours - Do not submerge for 7 days - Routine line care   Carson Meche T. Kathlene Cote, M.D Pager:  [phone]

## 2021-01-30 ENCOUNTER — Inpatient Hospital Stay: Payer: Medicare Other

## 2021-01-30 NOTE — Progress Notes (Addendum)
Nutrition Assessment:  Patient with primary for lung vs cholangio/pancreaticobiliary vs cervix.  Past medical history of DM, COPD, IBS, GERD, CKD, HLD.  Patient receiving chemotherapy.   Met with patient and husband in clinic. Patient reports no appetite. Drinks and ensure for breakfast, then may have few ritz crackers with peanut butter and then few bites of supper mostly soups.  Says that when she thinks about food makes her queasy to stomach but does not take nausea medications.  Reports some foods have no taste.  Can taste sweet things.  Has some issues with constipation.      Medications: zofran, compazine, lantus, lasix, vit B 12, folic acid, MVI, prilosec  Labs: reviewed  Anthropometrics:   Height: 61.5 inches Weight: 133 lb 4/22 UBW: 135-140 lb per patient BMI: 24   Estimated Energy Needs  Kcals: 1500-1800 Protein: 75-90 g Fluid: 1.5 L  NUTRITION DIAGNOSIS: Inadequate oral intake related to cancer and cancer related treatment side effects as evidenced by poor appetite, small weight loss    INTERVENTION:  Discussed importance of taking nausea medication when feels queasy from just thinking about food Recommend adding appetite stimulant.  Discussed ways to help with taste change. Handout provided Discussed ways to add calories and protein to diet. Handout given Encouraged nibbling q 2 hours.  Family to prepare small servings for patient Contact information given    MONITORING, EVALUATION, GOAL: weight trends, intake   NEXT VISIT: Monday, May 23 during infusion  Soley Harriss B. Zenia Resides, [place], [place] Registered Dietitian [phone] (mobile)

## 2021-02-04 ENCOUNTER — Encounter: Payer: Self-pay | Admitting: Internal Medicine

## 2021-02-04 ENCOUNTER — Inpatient Hospital Stay: Payer: Medicare Other | Attending: Internal Medicine

## 2021-02-04 ENCOUNTER — Inpatient Hospital Stay: Payer: Medicare Other

## 2021-02-04 ENCOUNTER — Inpatient Hospital Stay (HOSPITAL_BASED_OUTPATIENT_CLINIC_OR_DEPARTMENT_OTHER): Payer: Medicare Other | Admitting: Internal Medicine

## 2021-02-04 VITALS — HR 107

## 2021-02-04 VITALS — BP 131/67 | HR 110 | Temp 99.4°F | Resp 16 | Ht 61.5 in | Wt 129.0 lb

## 2021-02-04 DIAGNOSIS — C3432 Malignant neoplasm of lower lobe, left bronchus or lung: Secondary | ICD-10-CM

## 2021-02-04 DIAGNOSIS — Z95828 Presence of other vascular implants and grafts: Secondary | ICD-10-CM

## 2021-02-04 DIAGNOSIS — D649 Anemia, unspecified: Secondary | ICD-10-CM | POA: Diagnosis not present

## 2021-02-04 DIAGNOSIS — C787 Secondary malignant neoplasm of liver and intrahepatic bile duct: Secondary | ICD-10-CM | POA: Insufficient documentation

## 2021-02-04 DIAGNOSIS — Z5112 Encounter for antineoplastic immunotherapy: Secondary | ICD-10-CM | POA: Diagnosis present

## 2021-02-04 DIAGNOSIS — R978 Other abnormal tumor markers: Secondary | ICD-10-CM

## 2021-02-04 DIAGNOSIS — Z5189 Encounter for other specified aftercare: Secondary | ICD-10-CM | POA: Insufficient documentation

## 2021-02-04 DIAGNOSIS — Z5111 Encounter for antineoplastic chemotherapy: Secondary | ICD-10-CM | POA: Insufficient documentation

## 2021-02-04 DIAGNOSIS — C801 Malignant (primary) neoplasm, unspecified: Secondary | ICD-10-CM | POA: Diagnosis present

## 2021-02-04 LAB — CBC WITH DIFFERENTIAL/PLATELET
Abs Immature Granulocytes: 0.04 10*3/uL (ref 0.00–0.07)
Basophils Absolute: 0 10*3/uL (ref 0.0–0.1)
Basophils Relative: 1 %
Eosinophils Absolute: 0.1 10*3/uL (ref 0.0–0.5)
Eosinophils Relative: 1 %
HCT: 29.1 % — ABNORMAL LOW (ref 36.0–46.0)
Hemoglobin: 9.5 g/dL — ABNORMAL LOW (ref 12.0–15.0)
Immature Granulocytes: 1 %
Lymphocytes Relative: 11 %
Lymphs Abs: 0.7 10*3/uL (ref 0.7–4.0)
MCH: 31.1 pg (ref 26.0–34.0)
MCHC: 32.6 g/dL (ref 30.0–36.0)
MCV: 95.4 fL (ref 80.0–100.0)
Monocytes Absolute: 0.7 10*3/uL (ref 0.1–1.0)
Monocytes Relative: 11 %
Neutro Abs: 4.7 10*3/uL (ref 1.7–7.7)
Neutrophils Relative %: 75 %
Platelets: 399 10*3/uL (ref 150–400)
RBC: 3.05 MIL/uL — ABNORMAL LOW (ref 3.87–5.11)
RDW: 18.2 % — ABNORMAL HIGH (ref 11.5–15.5)
WBC: 6.2 10*3/uL (ref 4.0–10.5)
nRBC: 0 % (ref 0.0–0.2)

## 2021-02-04 LAB — COMPREHENSIVE METABOLIC PANEL
ALT: 21 U/L (ref 0–44)
AST: 30 U/L (ref 15–41)
Albumin: 2.2 g/dL — ABNORMAL LOW (ref 3.5–5.0)
Alkaline Phosphatase: 93 U/L (ref 38–126)
Anion gap: 11 (ref 5–15)
BUN: 18 mg/dL (ref 8–23)
CO2: 24 mmol/L (ref 22–32)
Calcium: 8.3 mg/dL — ABNORMAL LOW (ref 8.9–10.3)
Chloride: 101 mmol/L (ref 98–111)
Creatinine, Ser: 1.05 mg/dL — ABNORMAL HIGH (ref 0.44–1.00)
GFR, Estimated: 55 mL/min — ABNORMAL LOW (ref >60)
Glucose, Bld: 144 mg/dL — ABNORMAL HIGH (ref 70–99)
Potassium: 4.4 mmol/L (ref 3.5–5.1)
Sodium: 136 mmol/L (ref 135–145)
Total Bilirubin: 0.5 mg/dL (ref 0.3–1.2)
Total Protein: 7.4 g/dL (ref 6.5–8.1)

## 2021-02-04 MED ORDER — PALONOSETRON HCL INJECTION 0.25 MG/5ML
0.2500 mg | Freq: Once | INTRAVENOUS | Status: AC
  Administered 2021-02-04: 0.25 mg via INTRAVENOUS
  Filled 2021-02-04: qty 5

## 2021-02-04 MED ORDER — SODIUM CHLORIDE 0.9 % IV SOLN
200.0000 mg | Freq: Once | INTRAVENOUS | Status: AC
  Administered 2021-02-04: 200 mg via INTRAVENOUS
  Filled 2021-02-04: qty 8

## 2021-02-04 MED ORDER — HEPARIN SOD (PORK) LOCK FLUSH 100 UNIT/ML IV SOLN
INTRAVENOUS | Status: AC
  Filled 2021-02-04: qty 5

## 2021-02-04 MED ORDER — SODIUM CHLORIDE 0.9% FLUSH
10.0000 mL | Freq: Once | INTRAVENOUS | Status: AC
  Administered 2021-02-04: 10 mL via INTRAVENOUS
  Filled 2021-02-04: qty 10

## 2021-02-04 MED ORDER — SODIUM CHLORIDE 0.9 % IV SOLN: 4.0000 mg | Freq: Once | INTRAVENOUS | Status: DC

## 2021-02-04 MED ORDER — HEPARIN SOD (PORK) LOCK FLUSH 100 UNIT/ML IV SOLN
500.0000 [IU] | Freq: Once | INTRAVENOUS | Status: AC | PRN
  Administered 2021-02-04: 500 [IU]
  Filled 2021-02-04: qty 5

## 2021-02-04 MED ORDER — DEXAMETHASONE SODIUM PHOSPHATE 10 MG/ML IJ SOLN
4.0000 mg | Freq: Once | INTRAMUSCULAR | Status: AC
  Administered 2021-02-04: 4 mg via INTRAVENOUS
  Filled 2021-02-04: qty 1

## 2021-02-04 MED ORDER — SODIUM CHLORIDE 0.9 % IV SOLN
150.0000 mg | Freq: Once | INTRAVENOUS | Status: AC
  Administered 2021-02-04: 150 mg via INTRAVENOUS
  Filled 2021-02-04: qty 150

## 2021-02-04 MED ORDER — SODIUM CHLORIDE 0.9 % IV SOLN
Freq: Once | INTRAVENOUS | Status: AC
Start: 2021-02-04 — End: 2021-02-04
  Filled 2021-02-04: qty 250

## 2021-02-04 MED ORDER — SODIUM CHLORIDE 0.9 % IV SOLN
344.0000 mg | Freq: Once | INTRAVENOUS | Status: AC
  Administered 2021-02-04: 340 mg via INTRAVENOUS
  Filled 2021-02-04: qty 34

## 2021-02-04 MED ORDER — HEPARIN SOD (PORK) LOCK FLUSH 100 UNIT/ML IV SOLN
500.0000 [IU] | Freq: Once | INTRAVENOUS | Status: DC
  Filled 2021-02-04: qty 5

## 2021-02-04 NOTE — Assessment & Plan Note (Addendum)
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  CEA elevated/CA 19-9 elevated [>5800]; NGS no targetable mutation; except TMB-H.  April 7th, 2022-cancer type ID-90% "probability - adenocarcinoma-cannot exclude lung 5%; other tumor types less than 5% including gastroesophageal adenocarcinoma. S/p carb-ketruda #2.   #Proceed with carboplatin-Keytruda No. 3. Labs today reviewed;  acceptable for treatment today.   # COPD-post Levaquin/azithromycin; Chest x-ray shows possible lymphangitic spread.  Clinically STABLE.   # port malfunction: Stable  #Poor appetite-no weight loss-second underlying malignancy: Continue prednisone 5 mg a day.  New refill plan  #Prognosis: Unfortunately continues to be poor from/incurable disease.  Discussed that given patient's moderate to severe fatigue likely from chemotherapy/or from underlying progressive disease.  Await above CT scan as planned in 3 weeks.  If progression of disease noted/continued side effects negatively affecting her quality of life, further therapy could be discontinued.  Hospice would be recommended.  Patient states that she would want to continue chemotherapy for now.   # DISPOSITION:  # carbo-Keytruda today # d-2 fulphila as planned. #  Follow in 10 days;  labs- cbc/bmp; Possible IVFs over 1 hour # follow up in 3 weeks- MD: labs- cbc/cmp/ca-19-9; CEA- carbo-keytruda;D-2 fulphila CT prior--Dr.B

## 2021-02-04 NOTE — Progress Notes (Signed)
Per Dr. Jacinto Reap, okay to proceed with treatment today with a HR of 108.

## 2021-02-04 NOTE — Progress Notes (Signed)
Having weakness and pain in her right side. Has sinus pressure and cough.

## 2021-02-04 NOTE — Patient Instructions (Signed)
[facility]  Discharge Instructions: Thank you for choosing [facility] to provide your oncology and hematology care.  If you have a lab appointment with the [facility], please go directly to the [facility] and check in at the registration area.  Wear comfortable clothing and clothing appropriate for easy access to any Portacath or PICC line.   We strive to give you quality time with your provider. You may need to reschedule your appointment if you arrive late (15 or more minutes).  Arriving late affects you and other patients whose appointments are after yours.  Also, if you miss three or more appointments without notifying the office, you may be dismissed from the clinic at the provider's discretion.      For prescription refill requests, have your pharmacy contact our office and allow 72 hours for refills to be completed.    Today you received the following chemotherapy and/or immunotherapy agents: Keytruda, Carboplatin   To help prevent nausea and vomiting after your treatment, we encourage you to take your nausea medication as directed.  BELOW ARE SYMPTOMS THAT SHOULD BE REPORTED IMMEDIATELY: . *FEVER GREATER THAN 100.4 F (38 C) OR HIGHER . *CHILLS OR SWEATING . *NAUSEA AND VOMITING THAT IS NOT CONTROLLED WITH YOUR NAUSEA MEDICATION . *UNUSUAL SHORTNESS OF BREATH . *UNUSUAL BRUISING OR BLEEDING . *URINARY PROBLEMS (pain or burning when urinating, or frequent urination) . *BOWEL PROBLEMS (unusual diarrhea, constipation, pain near the anus) . TENDERNESS IN MOUTH AND THROAT WITH OR WITHOUT PRESENCE OF ULCERS (sore throat, sores in mouth, or a toothache) . UNUSUAL RASH, SWELLING OR PAIN  . UNUSUAL VAGINAL DISCHARGE OR ITCHING   Items with * indicate a potential emergency and should be followed up as soon as possible or go to the Emergency Department if any problems should occur.  Please show the CHEMOTHERAPY ALERT CARD or  IMMUNOTHERAPY ALERT CARD at check-in to the Emergency Department and triage nurse.  Should you have questions after your visit or need to cancel or reschedule your appointment, please contact [facility]  [phone] and follow the prompts.  Office hours are 8:00 a.m. to 4:30 p.m. Monday - Friday. Please note that voicemails left after 4:00 p.m. may not be returned until the following business day.  We are closed weekends and major holidays. You have access to a nurse at all times for urgent questions. Please call the main number to the clinic [phone] and follow the prompts.  For any non-urgent questions, you may also contact your provider using MyChart. We now offer e-Visits for anyone 13 and older to request care online for non-urgent symptoms. For details visit [email or web address].   Also download the MyChart app! Go to the app store, search "MyChart", open the app, select [facility], and log in with your MyChart username and password.  Due to Covid, a mask is required upon entering the hospital/clinic. If you do not have a mask, one will be given to you upon arrival. For doctor visits, patients may have 1 support person aged 67 or older with them. For treatment visits, patients cannot have anyone with them due to current Covid guidelines and our immunocompromised population.

## 2021-02-04 NOTE — Progress Notes (Signed)
[name] NOTE  Patient Care Team: Adin Hector, MD as PCP - General (Internal Medicine) Telford Nab, RN as Oncology Nurse Navigator Erby Pian, MD as Referring Physician (Specialist) Cammie Sickle, MD as Consulting Physician (Internal Medicine) Noreene Filbert, MD as Referring Physician (Radiation Oncology)  CHIEF COMPLAINTS/PURPOSE OF CONSULTATION: Lung cancer  #  Oncology History Overview Note  #FEB 2021- LUNG, LEFT LOWER LOBE; ENB BRONCHOALVEOLAR LAVAGE:  - SUSPICIOUS FOR MALIGNANCY. 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 LUNG ORIGIN vs cholangio- CA 19-9/CEA > 5000.April 7th, 2022-cancer type ID-90% "probability cervix adenocarcinoma-cannot exclude lung 5%; other tumor types less than 5% including gastroesophageal adenocarcinoma  # Cycle #1- CarboAlimta [waiting on NGS]; #2- carbo-Keytruda [stopped Alimta]; GCSF  # NGS/MOLECULAR TESTS: MARCH 2022- PDL-1 TPS=0%; TMB-HIGH; No target**    # PALLIATIVE CARE EVALUATION:  # PAIN MANAGEMENT:    DIAGNOSIS:   STAGE:         ;  GOALS:  CURRENT/MOST RECENT THERAPY :     Primary cancer of left lower lobe of lung ([place])  12/21/2019 Initial Diagnosis   Primary cancer of left lower lobe of lung ([place])   12/04/2020 Cancer Staging   Staging form: Lung, AJCC 8th Edition - Clinical: Stage IVA (cTX, cN2, pM1b) - Signed by Cammie Sickle, MD on 12/04/2020 Histopathologic type: Adenocarcinoma, NOS   12/14/2020 -  Chemotherapy    Patient is on Treatment Plan: LUNG NSCLC KEYTRUDA / CARBOPLATIN Q21D X 1 CYCLES        HISTORY OF PRESENTING ILLNESS:  Donna Brennan 75 y.o.  female prior history of lung cancer; and adenocarcinoma-s/p liver biopsy [?  Recurrent lung cancer versus cholangiocarcinoma]  thought clinically lung cancer status post carbo-Keytruda is here for follow-up.  Patient continues to be complaining of fatigue moderate to severe for the most of the days post chemotherapy.  Denies any worsening shortness of breath and denies any worsening cough.  Denies any swelling in the legs.  No diarrhea.  Denies any chest pain.  Denies any headaches.  Review of Systems  Constitutional: Positive for malaise/fatigue. Negative for chills, diaphoresis, fever and weight loss.  HENT: Negative for nosebleeds and sore throat.   Eyes: Negative for double vision.  Respiratory: Positive for cough and shortness of breath. Negative for hemoptysis, sputum production and wheezing.   Cardiovascular: Negative for chest pain, palpitations, orthopnea and leg swelling.  Gastrointestinal: Negative for abdominal pain, blood in stool, constipation, diarrhea, heartburn, melena, nausea and vomiting.  Genitourinary: Negative for dysuria, frequency and urgency.  Musculoskeletal: Positive for back pain and joint pain.  Skin: Negative.  Negative for itching and rash.  Neurological: Negative for dizziness, tingling, focal weakness, weakness and headaches.  Endo/Heme/Allergies: Does not bruise/bleed easily.  Psychiatric/Behavioral: Negative for depression. The patient is not nervous/anxious and does not have insomnia.      MEDICAL HISTORY:  Past Medical History:  Diagnosis Date  . CKD (chronic kidney disease), stage IV ([place])   . Colon polyps   . COPD (chronic obstructive pulmonary disease) ([place])   . Diabetes mellitus without complication ([place])   . GERD (gastroesophageal reflux disease)   . Headache   . Hepatitis    when in 4th or 5th grade.  Resolved  . Hyperlipidemia   . IBS (irritable bowel syndrome)   . Peptic ulcer   .  Tobacco abuse   . Vertigo    No "bad" episodes in over 1 year  . Wears dentures    partial ujpper and lower    SURGICAL HISTORY: Past Surgical History:  Procedure Laterality  Date  . ABDOMINAL HYSTERECTOMY    . carbuncle removal     urethra  . CATARACT EXTRACTION W/PHACO Left 10/29/2020   Procedure: CATARACT EXTRACTION PHACO AND INTRAOCULAR LENS PLACEMENT (I[facility]) LEFT DIABETIC 1051 01:00.0 ;  Surgeon: Eulogio Bear, MD;  Location: [facility];  Service: Ophthalmology;  Laterality: Left;  Diabetic - insulin  . CATARACT EXTRACTION W/PHACO Right 11/19/2020   Procedure: CATARACT EXTRACTION PHACO AND INTRAOCULAR LENS PLACEMENT ([place]) RIGHT DIABETIC;  Surgeon: Eulogio Bear, MD;  Location: [facility];  Service: Ophthalmology;  Laterality: Right;  16.54 1:40.0  . COLONOSCOPY    . COLONOSCOPY WITH PROPOFOL N/A 11/05/2017   Procedure: COLONOSCOPY WITH PROPOFOL;  Surgeon: Lollie Sails, MD;  Location: [facility] ENDOSCOPY;  Service: Endoscopy;  Laterality: N/A;  . ESOPHAGOGASTRODUODENOSCOPY    . ESOPHAGOGASTRODUODENOSCOPY (EGD) WITH PROPOFOL N/A 11/05/2017   Procedure: ESOPHAGOGASTRODUODENOSCOPY (EGD) WITH PROPOFOL;  Surgeon: Lollie Sails, MD;  Location: [facility] ENDOSCOPY;  Service: Endoscopy;  Laterality: N/A;  . IR IMAGING GUIDED PORT INSERTION  12/13/2020  . IR IMAGING GUIDED PORT INSERTION  01/25/2021  . VIDEO BRONCHOSCOPY WITH ENDOBRONCHIAL NAVIGATION N/A 12/02/2019   Procedure: VIDEO BRONCHOSCOPY WITH ENDOBRONCHIAL NAVIGATION;  Surgeon: Ottie Glazier, MD;  Location: [facility] ORS;  Service: Thoracic;  Laterality: N/A;  . VIDEO BRONCHOSCOPY WITH ENDOBRONCHIAL ULTRASOUND N/A 12/02/2019   Procedure: VIDEO BRONCHOSCOPY WITH ENDOBRONCHIAL ULTRASOUND;  Surgeon: Ottie Glazier, MD;  Location: [facility] ORS;  Service: Thoracic;  Laterality: N/A;    SOCIAL HISTORY: Social History   Socioeconomic History  . Marital status: Married    Spouse name: wayne  . Number of children: 6  . Years of education: Not on file  . Highest education level: Not on file  Occupational History  . Not on file  Tobacco Use  . Smoking status: Current Every Day Smoker     Packs/day: 0.25    Years: 45.00    Pack years: 11.25    Types: Cigarettes    Last attempt to quit: 08/06/2020    Years since quitting: 0.4  . Smokeless tobacco: Never Used  Vaping Use  . Vaping Use: Never used  Substance and Sexual Activity  . Alcohol use: No  . Drug use: No  . Sexual activity: Not on file  Other Topics Concern  . 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 [place].     Social Determinants of Health   Financial Resource Strain: Not on file  Food Insecurity: Not on file  Transportation Needs: Not on file  Physical Activity: Not on file  Stress: Not on file  Social Connections: Not on file  Intimate Partner Violence: Not on file    FAMILY HISTORY: Family History  Problem Relation Age of Onset  . Breast cancer Neg Hx     ALLERGIES:  is allergic to penicillins, metformin, and sulfa antibiotics.  MEDICATIONS:  Current Outpatient Medications  Medication Sig Dispense Refill  . acetaminophen (TYLENOL) 500 MG tablet Take 1,000 mg by mouth every 6 (six) hours as needed (for pain.).    Marland Kitchen albuterol (VENTOLIN HFA) 108 (90 Base) MCG/ACT inhaler Inhale 1-2 puffs into the lungs every 6 (six) hours as needed for  wheezing or shortness of breath. 8 g 1  . chlorpheniramine-HYDROcodone (TUSSIONEX) 10-8 MG/5ML SUER Take 5 mLs by mouth at bedtime as needed for cough. 200 mL 0  . diazepam (VALIUM) 5 MG tablet Take 5 mg by mouth daily as needed (for vertigo).    . ezetimibe (ZETIA) 10 MG tablet Take 10 mg by mouth daily.     . Fluticasone-Salmeterol (ADVAIR) 250-50 MCG/DOSE AEPB Inhale 1 puff into the lungs 2 (two) times daily.    . folic acid (FOLVITE) 1 MG tablet Take 1 tablet (1 mg total) by mouth daily. 90 tablet 1  . hyoscyamine (LEVBID) 0.375 MG 12 hr tablet Take 0.375 mg by mouth daily as needed (abdominal cramping).     . Ipratropium-Albuterol (COMBIVENT) 20-100 MCG/ACT  AERS respimat Inhale 1 puff into the lungs every 4 (four) hours as needed for wheezing.    Marland Kitchen ipratropium-albuterol (DUONEB) 0.5-2.5 (3) MG/3ML SOLN Take 3 mLs by nebulization every 6 (six) hours as needed (wheezing). 360 mL 0  . LANTUS SOLOSTAR 100 UNIT/ML Solostar Pen Inject 12-20 Units into the skin 2 (two) times daily. 12U in AM, 20U in PM    . lidocaine-prilocaine (EMLA) cream Apply 1 application topically as needed. 30 g 3  . Multiple Vitamin (MULTIVITAMIN WITH MINERALS) TABS tablet Take 1 tablet by mouth daily. One-A-Day for Women    . nystatin (MYCOSTATIN) 100000 UNIT/ML suspension Take 5 mLs by mouth 2 (two) times daily as needed (throat irritation.).    Marland Kitchen omeprazole (PRILOSEC) 20 MG capsule Take 20 mg by mouth daily before breakfast.     . ondansetron (ZOFRAN) 8 MG tablet Take 1 tablet (8 mg total) by mouth every 8 (eight) hours as needed for nausea or vomiting. 20 tablet 3  . prochlorperazine (COMPAZINE) 10 MG tablet Take 1 tablet (10 mg total) by mouth every 6 (six) hours as needed for nausea or vomiting. 30 tablet 3  . rosuvastatin (CRESTOR) 40 MG tablet Take 40 mg by mouth every evening.     . vitamin B-12 (CYANOCOBALAMIN) 1000 MCG tablet Take 1,000 mcg by mouth daily.    Marland Kitchen dexamethasone (DECADRON) 4 MG tablet Take one pill AM & PM for 2 days. TAKE day prior & day AFTER chemo. Do NOT take on day of chemo. (Patient not taking: Reported on 02/04/2021) 60 tablet 0   No current facility-administered medications for this visit.   Facility-Administered Medications Ordered in Other Visits  Medication Dose Route Frequency Provider Last Rate Last Admin  . heparin lock flush 100 unit/mL  500 Units Intravenous Once Charlaine Dalton R, MD      . sodium chloride flush (NS) 0.9 % injection 10 mL  10 mL Intravenous PRN Cammie Sickle, MD   10 mL at 12/24/20 0842  . sodium chloride flush (NS) 0.9 % injection 10 mL  10 mL Intravenous PRN Verlon Au, NP   10 mL at 12/28/20 1415       .  PHYSICAL EXAMINATION: ECOG PERFORMANCE STATUS: 0 - Asymptomatic  Vitals:   02/04/21 0951  BP: 131/67  Pulse: (!) 110  Resp: 16  Temp: 99.4 F (37.4 C)  SpO2: 99%   Filed Weights   02/04/21 0951  Weight: 129 lb (58.5 kg)    Physical Exam Constitutional:      Comments: Thin built Caucasian female patient.  She is ambulating independently.  Accompanied by daughter.  HENT:     Head: Normocephalic and atraumatic.     Mouth/Throat:  Pharynx: No oropharyngeal exudate.  Eyes:     Pupils: Pupils are equal, round, and reactive to light.  Cardiovascular:     Rate and Rhythm: Normal rate and regular rhythm.  Pulmonary:     Effort: No respiratory distress.     Breath sounds: No wheezing.     Comments: Decreased air entry bilaterally.  No wheeze or crackles. Abdominal:     General: Bowel sounds are normal. There is no distension.     Palpations: Abdomen is soft. There is no mass.     Tenderness: There is no abdominal tenderness. There is no guarding or rebound.  Musculoskeletal:        General: No tenderness. Normal range of motion.     Cervical back: Normal range of motion and neck supple.  Skin:    General: Skin is warm.  Neurological:     Mental Status: She is alert and oriented to person, place, and time.  Psychiatric:        Mood and Affect: Affect normal.    LABORATORY DATA:  I have reviewed the data as listed Lab Results  Component Value Date   WBC 6.2 02/04/2021   HGB 9.5 (L) 02/04/2021   HCT 29.1 (L) 02/04/2021   MCV 95.4 02/04/2021   PLT 399 02/04/2021   Recent Labs    01/07/21 0825 01/14/21 0933 02/04/21 0925  NA 133* 134* 136  K 4.2 4.1 4.4  CL 99 102 101  CO2 _0 GLUCOSE 258* 106* 144*  BUN _1 CREATININE 1.01* 1.08* 1.05*  CALCIUM 8.7* 8.1* 8.3*  GFRNONAA 58* 53* 55*  PROT 7.0 7.1 7.4  ALBUMIN 2.6* 2.2* 2.2*  AST _2 ALT _3 ALKPHOS 79 75 93  BILITOT 0.4 0.6 0.5    RADIOGRAPHIC STUDIES: I have personally  reviewed the radiological images as listed and agreed with the findings in the report. DG Fluoro Guide CV Line Right  Result Date: 01/16/2021 INDICATION: Inability to withdraw blood from right chest wall port EXAM: RIGHT CHEST PORT INJECTION MEDICATIONS: None ANESTHESIA/SEDATION: None FLUOROSCOPY TIME:  Fluoroscopy Time: 24 seconds, 53.6 mGy dose COMPLICATIONS: None immediate. PROCEDURE: Informed written consent was obtained from the patient after a thorough discussion of the procedural risks, benefits and alternatives. A timeout was performed prior to the initiation of the procedure. The right chest wall port had already been accessed. The port was then injected with Omnipaque 180 which shows the port reservoir to be widely patent. The port catheter is patent as well with free flow of contrast material into the right atrium. Catheter tip appears deep in the right atrium and when negative pressure was applied no significant blood return is noted likely related to the tip of the catheter contacting the atrial wall. The catheter tip position appears lower than that seen on recent chest x-ray from 12/28/2020 likely related in part to the supine positioning. IMPRESSION: Patent catheter without evidence of thrombus or fibrin sheath. No blood draw was obtained due to the catheter tip extending into the right atrium. This is lower than that seen on recent chest x-ray. Upright positioning may be helpful to obtain blood return as well as possible downward traction of the port reservoir prior to access. Electronically Signed   By: Inez Catalina M.D.   On: 01/16/2021 12:33   IR IMAGING GUIDED PORT INSERTION  Result Date: 01/25/2021 CLINICAL DATA:  History of metastatic adenocarcinoma and status post right-sided Port-A-Cath placement  on 12/13/2020. There has been difficulty and inability to aspirate blood from the port after access with contrast injection study demonstrating probable fibrin sheath formation. The patient  presents for port revision as she is in need a well-functioning port for continued chemotherapy. EXAM: 1. REMOVAL OF IMPLANTED PORT-A-CATH 2. NEW IMPLANTED PORT A CATH PLACEMENT WITH ULTRASOUND AND FLUOROSCOPIC GUIDANCE ANESTHESIA/SEDATION: 2.0 mg IV Versed; 150 mcg IV Fentanyl Total Moderate Sedation Time:  38 minutes The patient's level of consciousness and physiologic status were continuously monitored during the procedure by Radiology nursing. Additional Medications: 1 g IV vancomycin. FLUOROSCOPY TIME:  18 seconds.  1.1 mGy. PROCEDURE: The procedure, risks, benefits, and alternatives were explained to the patient. Questions regarding the procedure were encouraged and answered. The patient understands and consents to the procedure. A time-out was performed prior to initiating the procedure. Ultrasound was utilized to confirm patency of the right internal jugular vein. The right neck and chest were prepped with chlorhexidine in a sterile fashion, and a sterile drape was applied covering the operative field. Maximum barrier sterile technique with sterile gowns and gloves were used for the procedure. Local anesthesia was provided with 1% lidocaine. After creating a small venotomy incision, a 21 gauge needle was advanced into the right internal jugular vein under direct, real-time ultrasound guidance. Ultrasound image documentation was performed. After securing guidewire access, an 8 Fr dilator was placed. A J-wire was kinked to measure appropriate catheter length. The previously placed port was removed after an incision was made just below the chest reservoir. The entire port was removed successfully utilizing sharp and blunt dissection. A subcutaneous port pocket was then created along the upper chest wall utilizing sharp and blunt dissection below the level of the incision made to remove the previously placed port. Electrocautery was utilized. The pocket was irrigated with sterile saline. A single lumen power  injectable port was chosen for placement. The 8 Fr catheter was tunneled from the port pocket site to the venotomy incision. The port was placed in the pocket. External catheter was trimmed to appropriate length based on guidewire measurement. At the venotomy, an 8 Fr peel-away sheath was placed over a guidewire. The catheter was then placed through the sheath and the sheath removed. Final catheter positioning was confirmed and documented with a fluoroscopic spot image. The port was accessed with a needle and aspirated and flushed with heparinized saline. The access needle was removed. The venotomy and port pocket incisions were closed with subcutaneous 3-0 Monocryl and subcuticular 4-0 Vicryl. Dermabond was applied to both incisions. COMPLICATIONS: COMPLICATIONS None FINDINGS: After new catheter placement, the tip lies at the cavo-atrial junction. The catheter aspirates normally and is ready for immediate use. IMPRESSION: Placement of new single lumen port a cath via new access of the right internal jugular vein after removal of the previously placed port. The catheter tip of the new Port-A-Cath lies at the cavo-atrial junction. A power injectable port a cath was placed and is ready for immediate use. Electronically Signed   By: Aletta Edouard M.D.   On: 01/25/2021 13:35    ASSESSMENT & PLAN:   Primary cancer of left lower lobe of lung ([place]) #Liver biopsy positive for poorly differentiated adenocarcinoma-TTF-1 positive; however CK19 positive-primary lung versus cholangio-/pancreaticobiliary.  CEA elevated/CA 19-9 elevated [>5800]; NGS no targetable mutation; except TMB-H.  April 7th, 2022-cancer type ID-90% "probability - adenocarcinoma-cannot exclude lung 5%; other tumor types less than 5% including gastroesophageal adenocarcinoma. S/p carb-ketruda #2.   #Proceed with carboplatin-Keytruda No.  3. Labs today reviewed;  acceptable for treatment today.   # COPD-post Levaquin/azithromycin; Chest x-ray shows  possible lymphangitic spread.  Clinically STABLE.   # port malfunction: Stable  #Poor appetite-no weight loss-second underlying malignancy: Continue prednisone 5 mg a day.  New refill plan  #Prognosis: Unfortunately continues to be poor from/incurable disease.  Discussed that given patient's moderate to severe fatigue likely from chemotherapy/or from underlying progressive disease.  Await above CT scan as planned in 3 weeks.  If progression of disease noted/continued side effects negatively affecting her quality of life, further therapy could be discontinued.  Hospice would be recommended.  Patient states that she would want to continue chemotherapy for now.   # DISPOSITION:  # carbo-Keytruda today # d-2 fulphila as planned. #  Follow in 10 days;  labs- cbc/bmp; Possible IVFs over 1 hour # follow up in 3 weeks- MD: labs- cbc/cmp/ca-19-9; CEA- carbo-keytruda;D-2 fulphila CT prior--Dr.B    All questions were answered. The patient knows to call the clinic with any problems, questions or concerns.    Cammie Sickle, MD 02/04/2021 3:59 PM

## 2021-02-05 ENCOUNTER — Inpatient Hospital Stay: Payer: Medicare Other

## 2021-02-05 DIAGNOSIS — Z5112 Encounter for antineoplastic immunotherapy: Secondary | ICD-10-CM | POA: Diagnosis not present

## 2021-02-05 DIAGNOSIS — C3432 Malignant neoplasm of lower lobe, left bronchus or lung: Secondary | ICD-10-CM

## 2021-02-05 LAB — CANCER ANTIGEN 19-9: CA 19-9: 1407 U/mL — ABNORMAL HIGH (ref 0–35)

## 2021-02-05 LAB — CEA: CEA: 5545 ng/mL — ABNORMAL HIGH (ref 0.0–4.7)

## 2021-02-05 MED ORDER — PEGFILGRASTIM-JMDB 6 MG/0.6ML ~~LOC~~ SOSY
6.0000 mg | PREFILLED_SYRINGE | Freq: Once | SUBCUTANEOUS | Status: AC
  Administered 2021-02-05: 6 mg via SUBCUTANEOUS
  Filled 2021-02-05: qty 0.6

## 2021-02-07 ENCOUNTER — Other Ambulatory Visit: Payer: Self-pay | Admitting: Internal Medicine

## 2021-02-07 ENCOUNTER — Encounter: Payer: Self-pay | Admitting: Internal Medicine

## 2021-02-08 MED ORDER — HYDROCOD POLST-CPM POLST ER 10-8 MG/5ML PO SUER: 5.0000 mL | Freq: Every evening | ORAL | 0 refills | Status: AC | PRN

## 2021-02-13 ENCOUNTER — Inpatient Hospital Stay: Payer: Medicare Other

## 2021-02-13 ENCOUNTER — Ambulatory Visit: Payer: Medicare Other | Admitting: Internal Medicine

## 2021-02-13 ENCOUNTER — Other Ambulatory Visit: Payer: Self-pay | Admitting: Internal Medicine

## 2021-02-13 ENCOUNTER — Other Ambulatory Visit: Payer: Self-pay

## 2021-02-13 VITALS — BP 127/79 | HR 102 | Resp 16

## 2021-02-13 DIAGNOSIS — C3432 Malignant neoplasm of lower lobe, left bronchus or lung: Secondary | ICD-10-CM

## 2021-02-13 DIAGNOSIS — Z5112 Encounter for antineoplastic immunotherapy: Secondary | ICD-10-CM | POA: Diagnosis not present

## 2021-02-13 DIAGNOSIS — R978 Other abnormal tumor markers: Secondary | ICD-10-CM

## 2021-02-13 LAB — BASIC METABOLIC PANEL
Anion gap: 10 (ref 5–15)
BUN: 18 mg/dL (ref 8–23)
CO2: 23 mmol/L (ref 22–32)
Calcium: 7.9 mg/dL — ABNORMAL LOW (ref 8.9–10.3)
Chloride: 98 mmol/L (ref 98–111)
Creatinine, Ser: 1.03 mg/dL — ABNORMAL HIGH (ref 0.44–1.00)
GFR, Estimated: 56 mL/min — ABNORMAL LOW (ref >60)
Glucose, Bld: 211 mg/dL — ABNORMAL HIGH (ref 70–99)
Potassium: 4 mmol/L (ref 3.5–5.1)
Sodium: 131 mmol/L — ABNORMAL LOW (ref 135–145)

## 2021-02-13 LAB — CBC WITH DIFFERENTIAL/PLATELET
Abs Immature Granulocytes: 0.15 10*3/uL — ABNORMAL HIGH (ref 0.00–0.07)
Basophils Absolute: 0.1 10*3/uL (ref 0.0–0.1)
Basophils Relative: 1 %
Eosinophils Absolute: 0 10*3/uL (ref 0.0–0.5)
Eosinophils Relative: 0 %
HCT: 25.7 % — ABNORMAL LOW (ref 36.0–46.0)
Hemoglobin: 8.5 g/dL — ABNORMAL LOW (ref 12.0–15.0)
Immature Granulocytes: 1 %
Lymphocytes Relative: 5 %
Lymphs Abs: 0.5 10*3/uL — ABNORMAL LOW (ref 0.7–4.0)
MCH: 32.2 pg (ref 26.0–34.0)
MCHC: 33.1 g/dL (ref 30.0–36.0)
MCV: 97.3 fL (ref 80.0–100.0)
Monocytes Absolute: 1 10*3/uL (ref 0.1–1.0)
Monocytes Relative: 9 %
Neutro Abs: 9.3 10*3/uL — ABNORMAL HIGH (ref 1.7–7.7)
Neutrophils Relative %: 84 %
Platelets: 321 10*3/uL (ref 150–400)
RBC: 2.64 MIL/uL — ABNORMAL LOW (ref 3.87–5.11)
RDW: 18.5 % — ABNORMAL HIGH (ref 11.5–15.5)
Smear Review: NORMAL
WBC: 11 10*3/uL — ABNORMAL HIGH (ref 4.0–10.5)
nRBC: 0 % (ref 0.0–0.2)

## 2021-02-13 MED ORDER — HEPARIN SOD (PORK) LOCK FLUSH 100 UNIT/ML IV SOLN
INTRAVENOUS | Status: AC
  Filled 2021-02-13: qty 5

## 2021-02-13 MED ORDER — SODIUM CHLORIDE 0.9% FLUSH
10.0000 mL | Freq: Once | INTRAVENOUS | Status: DC
  Filled 2021-02-13: qty 10

## 2021-02-13 MED ORDER — ONDANSETRON HCL 4 MG/2ML IJ SOLN: 8.0000 mg | Freq: Once | INTRAMUSCULAR | Status: DC

## 2021-02-13 MED ORDER — HEPARIN SOD (PORK) LOCK FLUSH 100 UNIT/ML IV SOLN
500.0000 [IU] | Freq: Once | INTRAVENOUS | Status: AC | PRN
  Administered 2021-02-13: 500 [IU]
  Filled 2021-02-13: qty 5

## 2021-02-13 MED ORDER — SODIUM CHLORIDE 0.9 % IV SOLN: Freq: Once | INTRAVENOUS | Status: DC

## 2021-02-13 MED ORDER — SODIUM CHLORIDE 0.9 % IV SOLN
Freq: Once | INTRAVENOUS | Status: AC
  Filled 2021-02-13: qty 250

## 2021-02-15 ENCOUNTER — Encounter: Payer: Self-pay | Admitting: *Deleted

## 2021-02-15 LAB — CANCER ANTIGEN 19-9: CA 19-9: 862 U/mL — ABNORMAL HIGH (ref 0–35)

## 2021-02-18 ENCOUNTER — Ambulatory Visit
Admission: RE | Admit: 2021-02-18 | Discharge: 2021-02-18 | Disposition: A | Discharge status: Home / Self Care | Payer: Medicare Other | Source: Ambulatory Visit | Attending: Internal Medicine | Admitting: Internal Medicine

## 2021-02-18 ENCOUNTER — Other Ambulatory Visit: Payer: Self-pay

## 2021-02-18 DIAGNOSIS — C3432 Malignant neoplasm of lower lobe, left bronchus or lung: Secondary | ICD-10-CM | POA: Diagnosis not present

## 2021-02-18 LAB — CEA: CEA: 5964 ng/mL — ABNORMAL HIGH (ref 0.0–4.7)

## 2021-02-18 MED ORDER — IOHEXOL 300 MG/ML  SOLN
75.0000 mL | Freq: Once | INTRAMUSCULAR | Status: AC | PRN
  Administered 2021-02-18: 75 mL via INTRAVENOUS

## 2021-02-20 ENCOUNTER — Encounter: Payer: Self-pay | Admitting: Internal Medicine

## 2021-02-21 ENCOUNTER — Encounter: Payer: Self-pay | Admitting: *Deleted

## 2021-02-25 ENCOUNTER — Inpatient Hospital Stay: Payer: Medicare Other

## 2021-02-25 ENCOUNTER — Inpatient Hospital Stay (HOSPITAL_BASED_OUTPATIENT_CLINIC_OR_DEPARTMENT_OTHER): Payer: Medicare Other | Admitting: Internal Medicine

## 2021-02-25 ENCOUNTER — Encounter: Payer: Self-pay | Admitting: Internal Medicine

## 2021-02-25 VITALS — BP 124/64 | HR 113 | Temp 99.3°F | Resp 16 | Ht 61.5 in | Wt 125.0 lb

## 2021-02-25 DIAGNOSIS — T17800A Unspecified foreign body in other parts of respiratory tract causing asphyxiation, initial encounter: Secondary | ICD-10-CM

## 2021-02-25 DIAGNOSIS — C3432 Malignant neoplasm of lower lobe, left bronchus or lung: Secondary | ICD-10-CM

## 2021-02-25 DIAGNOSIS — R1310 Dysphagia, unspecified: Secondary | ICD-10-CM

## 2021-02-25 DIAGNOSIS — T17908A Unspecified foreign body in respiratory tract, part unspecified causing other injury, initial encounter: Secondary | ICD-10-CM | POA: Diagnosis not present

## 2021-02-25 DIAGNOSIS — D649 Anemia, unspecified: Secondary | ICD-10-CM

## 2021-02-25 DIAGNOSIS — R059 Cough, unspecified: Secondary | ICD-10-CM

## 2021-02-25 DIAGNOSIS — Z5112 Encounter for antineoplastic immunotherapy: Secondary | ICD-10-CM | POA: Diagnosis not present

## 2021-02-25 DIAGNOSIS — R978 Other abnormal tumor markers: Secondary | ICD-10-CM

## 2021-02-25 LAB — COMPREHENSIVE METABOLIC PANEL
ALT: 15 U/L (ref 0–44)
AST: 22 U/L (ref 15–41)
Albumin: 2.3 g/dL — ABNORMAL LOW (ref 3.5–5.0)
Alkaline Phosphatase: 86 U/L (ref 38–126)
Anion gap: 12 (ref 5–15)
BUN: 17 mg/dL (ref 8–23)
CO2: 22 mmol/L (ref 22–32)
Calcium: 8.4 mg/dL — ABNORMAL LOW (ref 8.9–10.3)
Chloride: 101 mmol/L (ref 98–111)
Creatinine, Ser: 1.02 mg/dL — ABNORMAL HIGH (ref 0.44–1.00)
GFR, Estimated: 57 mL/min — ABNORMAL LOW (ref >60)
Glucose, Bld: 172 mg/dL — ABNORMAL HIGH (ref 70–99)
Potassium: 4 mmol/L (ref 3.5–5.1)
Sodium: 135 mmol/L (ref 135–145)
Total Bilirubin: 0.6 mg/dL (ref 0.3–1.2)
Total Protein: 7.5 g/dL (ref 6.5–8.1)

## 2021-02-25 LAB — CBC WITH DIFFERENTIAL/PLATELET
Abs Immature Granulocytes: 0.07 10*3/uL (ref 0.00–0.07)
Basophils Absolute: 0 10*3/uL (ref 0.0–0.1)
Basophils Relative: 0 %
Eosinophils Absolute: 0 10*3/uL (ref 0.0–0.5)
Eosinophils Relative: 0 %
HCT: 23.4 % — ABNORMAL LOW (ref 36.0–46.0)
Hemoglobin: 7.7 g/dL — ABNORMAL LOW (ref 12.0–15.0)
Immature Granulocytes: 1 %
Lymphocytes Relative: 11 %
Lymphs Abs: 0.9 10*3/uL (ref 0.7–4.0)
MCH: 33.2 pg (ref 26.0–34.0)
MCHC: 32.9 g/dL (ref 30.0–36.0)
MCV: 100.9 fL — ABNORMAL HIGH (ref 80.0–100.0)
Monocytes Absolute: 0.9 10*3/uL (ref 0.1–1.0)
Monocytes Relative: 11 %
Neutro Abs: 6.1 10*3/uL (ref 1.7–7.7)
Neutrophils Relative %: 77 %
Platelets: 208 10*3/uL (ref 150–400)
RBC: 2.32 MIL/uL — ABNORMAL LOW (ref 3.87–5.11)
RDW: 21.2 % — ABNORMAL HIGH (ref 11.5–15.5)
WBC: 8 10*3/uL (ref 4.0–10.5)
nRBC: 0 % (ref 0.0–0.2)

## 2021-02-25 LAB — SAMPLE TO BLOOD BANK

## 2021-02-25 LAB — ABO/RH: ABO/RH(D): A POS

## 2021-02-25 LAB — PREPARE RBC (CROSSMATCH)

## 2021-02-25 MED ORDER — ACETAMINOPHEN 325 MG PO TABS
650.0000 mg | ORAL_TABLET | Freq: Once | ORAL | Status: AC
  Administered 2021-02-25: 650 mg via ORAL
  Filled 2021-02-25: qty 2

## 2021-02-25 MED ORDER — HEPARIN SOD (PORK) LOCK FLUSH 100 UNIT/ML IV SOLN
INTRAVENOUS | Status: AC
  Filled 2021-02-25: qty 5

## 2021-02-25 MED ORDER — SODIUM CHLORIDE 0.9% IV SOLUTION
250.0000 mL | Freq: Once | INTRAVENOUS | Status: AC
  Administered 2021-02-25: 250 mL via INTRAVENOUS
  Filled 2021-02-25: qty 250

## 2021-02-25 MED ORDER — HEPARIN SOD (PORK) LOCK FLUSH 100 UNIT/ML IV SOLN
250.0000 [IU] | INTRAVENOUS | Status: AC | PRN
  Administered 2021-02-25: 250 [IU]
  Filled 2021-02-25: qty 5

## 2021-02-25 MED ORDER — MEGESTROL ACETATE 625 MG/5ML PO SUSP: 625.0000 mg | Freq: Every day | ORAL | 0 refills | Status: AC

## 2021-02-25 MED ORDER — DIPHENHYDRAMINE HCL 25 MG PO CAPS
25.0000 mg | ORAL_CAPSULE | Freq: Once | ORAL | Status: AC
  Administered 2021-02-25: 25 mg via ORAL
  Filled 2021-02-25: qty 1

## 2021-02-25 NOTE — Patient Instructions (Signed)
[facility]COL[facility]  Discharge Instructions: Thank you for choosing [facility] to provide your oncology and hematology care.  If you have a lab appointment with the [facility], please go directly to the [facility] and check in at the registration area.  Wear comfortable clothing and clothing appropriate for easy access to any Portacath or PICC line.   We strive to give you quality time with your provider. You may need to reschedule your appointment if you arrive late (15 or more minutes).  Arriving late affects you and other patients whose appointments are after yours.  Also, if you miss three or more appointments without notifying the office, you may be dismissed from the clinic at the provider's discretion.      For prescription refill requests, have your pharmacy contact our office and allow 72 hours for refills to be completed.    Today you received the following: Blood transfusion   To help prevent nausea and vomiting after your treatment, we encourage you to take your nausea medication as directed.  BELOW ARE SYMPTOMS THAT SHOULD BE REPORTED IMMEDIATELY: . *FEVER GREATER THAN 100.4 F (38 C) OR HIGHER . *CHILLS OR SWEATING . *NAUSEA AND VOMITING THAT IS NOT CONTROLLED WITH YOUR NAUSEA MEDICATION . *UNUSUAL SHORTNESS OF BREATH . *UNUSUAL BRUISING OR BLEEDING . *URINARY PROBLEMS (pain or burning when urinating, or frequent urination) . *BOWEL PROBLEMS (unusual diarrhea, constipation, pain near the anus) . TENDERNESS IN MOUTH AND THROAT WITH OR WITHOUT PRESENCE OF ULCERS (sore throat, sores in mouth, or a toothache) . UNUSUAL RASH, SWELLING OR PAIN  . UNUSUAL VAGINAL DISCHARGE OR ITCHING   Items with * indicate a potential emergency and should be followed up as soon as possible or go to the Emergency Department if any problems should occur.  Please show the CHEMOTHERAPY ALERT CARD or IMMUNOTHERAPY ALERT CARD at check-in to the Emergency  Department and triage nurse.  Should you have questions after your visit or need to cancel or reschedule your appointment, please contact [facility]  [phone] and follow the prompts.  Office hours are 8:00 a.m. to 4:30 p.m. Monday - Friday. Please note that voicemails left after 4:00 p.m. may not be returned until the following business day.  We are closed weekends and major holidays. You have access to a nurse at all times for urgent questions. Please call the main number to the clinic [phone] and follow the prompts.  For any non-urgent questions, you may also contact your provider using MyChart. We now offer e-Visits for anyone 41 and older to request care online for non-urgent symptoms. For details visit [email or web address].   Also download the MyChart app! Go to the app store, search "MyChart", open the app, select [facility], and log in with your MyChart username and password.   Blood Transfusion, Adult, Care After This sheet gives you information about how to care for yourself after your procedure. Your doctor may also give you more specific instructions. If you have problems or questions, contact your doctor. What can I expect after the procedure? After the procedure, it is common to have:  Bruising and soreness at the IV site.  A fever or chills on the day of the procedure. This may be your body's response to the new blood cells received.  A headache. Follow these instructions at home: Insertion site care  Follow instructions from your doctor about how to take care of your insertion site. This is  where an IV tube was put into your vein. Make sure you: ? Wash your hands with soap and water before and after you change your bandage (dressing). If you cannot use soap and water, use hand sanitizer. ? Change your bandage as told by your doctor.  Check your insertion site every day for signs of infection. Check for: ? Redness, swelling,  or pain. ? Bleeding from the site. ? Warmth. ? Pus or a bad smell.      General instructions  Take over-the-counter and prescription medicines only as told by your doctor.  Rest as told by your doctor.  Go back to your normal activities as told by your doctor.  Keep all follow-up visits as told by your doctor. This is important. Contact a doctor if:  You have itching or red, swollen areas of skin (hives).  You feel worried or nervous (anxious).  You feel weak after doing your normal activities.  You have redness, swelling, warmth, or pain around the insertion site.  You have blood coming from the insertion site, and the blood does not stop with pressure.  You have pus or a bad smell coming from the insertion site. Get help right away if:  You have signs of a serious reaction. This may be coming from an allergy or the body's defense system (immune system). Signs include: ? Trouble breathing or shortness of breath. ? Swelling of the face or feeling warm (flushed). ? Fever or chills. ? Head, chest, or back pain. ? Dark pee (urine) or blood in the pee. ? Widespread rash. ? Fast heartbeat. ? Feeling dizzy or light-headed. You may receive your blood transfusion in an outpatient setting. If so, you will be told whom to contact to report any reactions. These symptoms may be an emergency. Do not wait to see if the symptoms will go away. Get medical help right away. Call your local emergency services (911 in the [place] Do not drive yourself to the hospital. Summary  Bruising and soreness at the IV site are common.  Check your insertion site every day for signs of infection.  Rest as told by your doctor. Go back to your normal activities as told by your doctor.  Get help right away if you have signs of a serious reaction. This information is not intended to replace advice given to you by your health care provider. Make sure you discuss any questions you have with your health  care provider. Document Revised: 03/17/2019 Document Reviewed: 03/17/2019 Elsevier Patient Education  2021 [place].  Due to Covid, a mask is required upon entering the hospital/clinic. If you do not have a mask, one will be given to you upon arrival. For doctor visits, patients may have 1 support person aged 86 or older with them. For treatment visits, patients cannot have anyone with them due to current Covid guidelines and our immunocompromised population.

## 2021-02-25 NOTE — Addendum Note (Signed)
Addended by: Charlaine Dalton R on: 02/25/2021 11:41 AM   Modules accepted: Orders

## 2021-02-25 NOTE — Progress Notes (Signed)
Nutrition Follow-up:  Patient with primary lung vs cholangio/pancreaticobiliary vs cervix.  Patient receiving chemotherapy.   Met with patient during infusion. Patient reports no appetite.  Reports that she is drinking oral nutrition supplements maybe 1 a day and body armour drinks.  Yesterday took about 4 bites of chicken, few bites of macaroni salad and 3-4 bites of dessert.  Saturday ate sausage biscuit and took her all day to eat it.  Reports sweet foods really are too sweet for her.  MD with concerns regarding aspiration noted on CT. Observed patient swallowing medications today and patient swallowed with forceful swallow and grimace on face.  No coughing observed.  Says that it is hard for her to swallow foods and liquids.  Sometimes will cough when drinking liquids.     Medications: megace added today  Labs: reviewed  Anthropometrics:   Weight 125 lb   133 lb on 4/22  UBW of 135-140 lb   NUTRITION DIAGNOSIS: Inadequate oral intake continues   INTERVENTION:  Recommend evaluation by SLP.  Discussed with MD Gave sample of [facility] 1.4 shake for patient to try (thinner).  Reports boost shakes are really thick.  Discussed adding milk, pouring over ice.       MONITORING, EVALUATION, GOAL: weight trends, intake   NEXT VISIT: Monday, June 6 during infusion  Donna Brennan, [place], [place] Registered Dietitian [phone] (mobile)

## 2021-02-25 NOTE — Assessment & Plan Note (Addendum)
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  CEA elevated/CA 19-9 elevated [>5800]; s/p carbo-Keytruda #3- May 16th, 2022-interval decrease in size of the mediastinal lymph nodes; slight decrease in the liver mass; groundglass opacity/left lower lobe [? Aspiration-see below].  # HOLD carboplatin-Keytruda #. 4. Labs today reviewed;  UNacceptable for treatment today sec to anemia/fatigue.  Post 4 cycles recommend-Keytruda maintenance.  #Anemia hemoglobin 7.7-secondary carboplatin; plan 1 unit PRBC transfusion.  Discussed the pros and cons of PRBC transfusion.  Patient agreement for 1 unit.  # COPD/wheezing; -recommend continue inhalers/defer to Dr. Raul Del regarding management.  # port malfunction: Stable  #Poor appetite- weight loss-second underlying malignancy: Recommend Megace.  Discussed regarding risk of blood clots.; discussed re: aspiration precaution.  # Hand tremors: Check thyroid panel*; plan neurology evaluation.  # DISPOSITION:  # HOLD Keytruda-carbo today;  # HOLd tube today.; 1 unit PRBC # follow up in 2 weeks- MD: labs- cbc/cmp/ca-19-9; CEA- carbo-keytruda;D-2 fulphila Dr. .B  # I reviewed the blood work- with the patient in detail; also reviewed the imaging independently [as summarized above]; and with the patient in detail.   Addendum: ordered MBS; discussed with katherine watson. Will discuss with daughter.

## 2021-02-25 NOTE — Progress Notes (Signed)
[facility] NOTE  Patient Care Team: Adin Hector, MD as PCP - General (Internal Medicine) Telford Nab, RN as Oncology Nurse Navigator Erby Pian, MD as Referring Physician (Specialist) Cammie Sickle, MD as Consulting Physician (Internal Medicine) Noreene Filbert, MD as Referring Physician (Radiation Oncology)  CHIEF COMPLAINTS/PURPOSE OF CONSULTATION: Lung cancer  #  Oncology History Overview Note  #FEB 2021- LUNG, LEFT LOWER LOBE; ENB BRONCHOALVEOLAR LAVAGE:  - SUSPICIOUS FOR MALIGNANCY. 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 LUNG ORIGIN vs cholangio- CA 19-9/CEA > 5000.April 7th, 2022-cancer type ID-90% "probability cervix adenocarcinoma-cannot exclude lung 5%; other tumor types less than 5% including gastroesophageal adenocarcinoma  # Cycle #1- CarboAlimta [waiting on NGS]; #2- carbo-Keytruda [stopped Alimta]; GCSF; s/p 3 cycles of carbo-keytruda- May 16th CT-Partial response  # NGS/MOLECULAR TESTS: MARCH 2022- PDL-1 TPS=0%; TMB-HIGH; No target**    # PALLIATIVE CARE EVALUATION:  # PAIN MANAGEMENT:    DIAGNOSIS: Lung adeno vs cholangio  STAGE:  IV       ;  GOALS: pallaitive  CURRENT/MOST RECENT THERAPY : carbo-keytruda    Primary cancer of left lower lobe of lung ([place])  12/21/2019 Initial Diagnosis   Primary cancer of left lower lobe of lung ([place])   12/04/2020 Cancer Staging   Staging form: Lung, AJCC 8th Edition - Clinical: Stage IVA (cTX, cN2, pM1b) - Signed by Cammie Sickle, MD on 12/04/2020 Histopathologic type: Adenocarcinoma, NOS   12/14/2020 -  Chemotherapy    Patient is on Treatment Plan: LUNG NSCLC KEYTRUDA / CARBOPLATIN Q21D X 1 CYCLES        HISTORY OF PRESENTING ILLNESS:  Donna Brennan 75 y.o.  female pleasant  patient with adenocarcinoma-s/p liver biopsy [?  Recurrent lung cancer versus cholangiocarcinoma] thought clinically lung cancer status post carbo-Keytruda is here for follow-up/review results of the restaging CAT scan.  Patient complains of worsening fatigue.  Complains of episodes of coughing while eating.  Shortness of breath exertion.  Denies any worsening swelling in the legs.  Complains of tremors bilateral hands right more than left.  No headaches.  Review of Systems  Constitutional: Positive for malaise/fatigue. Negative for chills, diaphoresis, fever and weight loss.  HENT: Negative for nosebleeds and sore throat.   Eyes: Negative for double vision.  Respiratory: Positive for cough and shortness of breath. Negative for hemoptysis, sputum production and wheezing.   Cardiovascular: Negative for chest pain, palpitations, orthopnea and leg swelling.  Gastrointestinal: Negative for abdominal pain, blood in stool, constipation, diarrhea, heartburn, melena, nausea and vomiting.  Genitourinary: Negative for dysuria, frequency and urgency.  Musculoskeletal: Positive for back pain and joint pain.  Skin: Negative.  Negative for itching and rash.  Neurological: Negative for dizziness, tingling, focal weakness, weakness and headaches.  Endo/Heme/Allergies: Does not bruise/bleed easily.  Psychiatric/Behavioral: Negative for depression. The patient is not nervous/anxious and does not have insomnia.      MEDICAL HISTORY:  Past Medical History:  Diagnosis Date  . CKD (chronic kidney disease), stage IV ([place])   . Colon polyps   . COPD (chronic obstructive pulmonary disease) ([place])   . Diabetes mellitus without complication ([place])   . GERD (gastroesophageal reflux disease)   . Headache   . Hepatitis    when in 4th or 5th grade.  Resolved  . Hyperlipidemia   . IBS (irritable bowel syndrome)   .  Peptic ulcer   . Tobacco abuse   . Vertigo    No "bad" episodes in over 1 year  . Wears dentures     partial ujpper and lower    SURGICAL HISTORY: Past Surgical History:  Procedure Laterality Date  . ABDOMINAL HYSTERECTOMY    . carbuncle removal     urethra  . CATARACT EXTRACTION W/PHACO Left 10/29/2020   Procedure: CATARACT EXTRACTION PHACO AND INTRAOCULAR LENS PLACEMENT (IOC) LEFT DIABETIC 1051 01:00.0 ;  Surgeon: Eulogio Bear, MD;  Location: [facility];  Service: Ophthalmology;  Laterality: Left;  Diabetic - insulin  . CATARACT EXTRACTION W/PHACO Right 11/19/2020   Procedure: CATARACT EXTRACTION PHACO AND INTRAOCULAR LENS PLACEMENT ([place]) RIGHT DIABETIC;  Surgeon: Eulogio Bear, MD;  Location: [facility];  Service: Ophthalmology;  Laterality: Right;  16.54 1:40.0  . COLONOSCOPY    . COLONOSCOPY WITH PROPOFOL N/A 11/05/2017   Procedure: COLONOSCOPY WITH PROPOFOL;  Surgeon: Lollie Sails, MD;  Location: [facility] ENDOSCOPY;  Service: Endoscopy;  Laterality: N/A;  . ESOPHAGOGASTRODUODENOSCOPY    . ESOPHAGOGASTRODUODENOSCOPY (EGD) WITH PROPOFOL N/A 11/05/2017   Procedure: ESOPHAGOGASTRODUODENOSCOPY (EGD) WITH PROPOFOL;  Surgeon: Lollie Sails, MD;  Location: [facility] ENDOSCOPY;  Service: Endoscopy;  Laterality: N/A;  . IR IMAGING GUIDED PORT INSERTION  12/13/2020  . IR IMAGING GUIDED PORT INSERTION  01/25/2021  . VIDEO BRONCHOSCOPY WITH ENDOBRONCHIAL NAVIGATION N/A 12/02/2019   Procedure: VIDEO BRONCHOSCOPY WITH ENDOBRONCHIAL NAVIGATION;  Surgeon: Ottie Glazier, MD;  Location: [facility] ORS;  Service: Thoracic;  Laterality: N/A;  . VIDEO BRONCHOSCOPY WITH ENDOBRONCHIAL ULTRASOUND N/A 12/02/2019   Procedure: VIDEO BRONCHOSCOPY WITH ENDOBRONCHIAL ULTRASOUND;  Surgeon: Ottie Glazier, MD;  Location: [facility] ORS;  Service: Thoracic;  Laterality: N/A;    SOCIAL HISTORY: Social History   Socioeconomic History  . Marital status: Married    Spouse name: wayne  . Number of children: 6  . Years of education: Not on file  . Highest education level: Not on file  Occupational  History  . Not on file  Tobacco Use  . Smoking status: Current Every Day Smoker    Packs/day: 0.25    Years: 45.00    Pack years: 11.25    Types: Cigarettes    Last attempt to quit: 08/06/2020    Years since quitting: 0.5  . Smokeless tobacco: Never Used  Vaping Use  . Vaping Use: Never used  Substance and Sexual Activity  . Alcohol use: No  . Drug use: No  . Sexual activity: Not on file  Other Topics Concern  . 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 [place].     Social Determinants of Health   Financial Resource Strain: Not on file  Food Insecurity: Not on file  Transportation Needs: Not on file  Physical Activity: Not on file  Stress: Not on file  Social Connections: Not on file  Intimate Partner Violence: Not on file    FAMILY HISTORY: Family History  Problem Relation Age of Onset  . Breast cancer Neg Hx     ALLERGIES:  is allergic to penicillins, metformin, and sulfa antibiotics.  MEDICATIONS:  Current Outpatient Medications  Medication Sig Dispense Refill  . acetaminophen (TYLENOL) 500 MG tablet Take 1,000 mg by mouth every 6 (six) hours as needed (for pain.).    Marland Kitchen albuterol (VENTOLIN HFA) 108 (90 Base) MCG/ACT inhaler Inhale 1-2 puffs into the lungs every 6 (  six) hours as needed for wheezing or shortness of breath. 8 g 1  . chlorpheniramine-HYDROcodone (TUSSIONEX) 10-8 MG/5ML SUER Take 5 mLs by mouth at bedtime as needed for cough. 200 mL 0  . diazepam (VALIUM) 5 MG tablet Take 5 mg by mouth daily as needed (for vertigo).    . ezetimibe (ZETIA) 10 MG tablet Take 10 mg by mouth daily.     . Fluticasone-Salmeterol (ADVAIR) 250-50 MCG/DOSE AEPB Inhale 1 puff into the lungs 2 (two) times daily.    . folic acid (FOLVITE) 1 MG tablet Take 1 tablet (1 mg total) by mouth daily. 90 tablet 1  . hyoscyamine (LEVBID) 0.375 MG 12 hr tablet Take 0.375 mg by mouth daily  as needed (abdominal cramping).     . Ipratropium-Albuterol (COMBIVENT) 20-100 MCG/ACT AERS respimat Inhale 1 puff into the lungs every 4 (four) hours as needed for wheezing.    Marland Kitchen ipratropium-albuterol (DUONEB) 0.5-2.5 (3) MG/3ML SOLN Take 3 mLs by nebulization every 6 (six) hours as needed (wheezing). 360 mL 0  . LANTUS SOLOSTAR 100 UNIT/ML Solostar Pen Inject 12-20 Units into the skin 2 (two) times daily. 12U in AM, 20U in PM    . lidocaine-prilocaine (EMLA) cream Apply 1 application topically as needed. 30 g 3  . megestrol (MEGACE ES) 625 MG/5ML suspension Take 5 mLs (625 mg total) by mouth daily. 150 mL 0  . Multiple Vitamin (MULTIVITAMIN WITH MINERALS) TABS tablet Take 1 tablet by mouth daily. One-A-Day for Women    . nystatin (MYCOSTATIN) 100000 UNIT/ML suspension Take 5 mLs by mouth 2 (two) times daily as needed (throat irritation.).    Marland Kitchen omeprazole (PRILOSEC) 20 MG capsule Take 20 mg by mouth daily before breakfast.     . ondansetron (ZOFRAN) 8 MG tablet Take 1 tablet (8 mg total) by mouth every 8 (eight) hours as needed for nausea or vomiting. 20 tablet 3  . prochlorperazine (COMPAZINE) 10 MG tablet Take 1 tablet (10 mg total) by mouth every 6 (six) hours as needed for nausea or vomiting. 30 tablet 3  . rosuvastatin (CRESTOR) 40 MG tablet Take 40 mg by mouth every evening.     . vitamin B-12 (CYANOCOBALAMIN) 1000 MCG tablet Take 1,000 mcg by mouth daily.     No current facility-administered medications for this visit.   Facility-Administered Medications Ordered in Other Visits  Medication Dose Route Frequency Provider Last Rate Last Admin  . 0.9 %  sodium chloride infusion (Manually program via Guardrails IV Fluids)  250 mL Intravenous Once Cammie Sickle, MD      . acetaminophen (TYLENOL) tablet 650 mg  650 mg Oral Once Charlaine Dalton R, MD      . diphenhydrAMINE (BENADRYL) capsule 25 mg  25 mg Oral Once Charlaine Dalton R, MD      . heparin lock flush 100 unit/mL  250  Units Intracatheter PRN Charlaine Dalton R, MD      . sodium chloride flush (NS) 0.9 % injection 10 mL  10 mL Intravenous PRN Cammie Sickle, MD   10 mL at 12/24/20 0842  . sodium chloride flush (NS) 0.9 % injection 10 mL  10 mL Intravenous PRN Verlon Au, NP   10 mL at 12/28/20 1415      .  PHYSICAL EXAMINATION: ECOG PERFORMANCE STATUS: 0 - Asymptomatic  Vitals:   02/25/21 0843  BP: 124/64  Pulse: (!) 113  Resp: 16  Temp: 99.3 F (37.4 C)  SpO2: 100%   Filed Weights  02/25/21 0843  Weight: 125 lb (56.7 kg)    Physical Exam Constitutional:      Comments: Thin built Caucasian female patient.  She is ambulating independently.  Accompanied by daughter.  HENT:     Head: Normocephalic and atraumatic.     Mouth/Throat:     Pharynx: No oropharyngeal exudate.  Eyes:     Pupils: Pupils are equal, round, and reactive to light.  Cardiovascular:     Rate and Rhythm: Normal rate and regular rhythm.  Pulmonary:     Effort: No respiratory distress.     Breath sounds: No wheezing.     Comments: Decreased air entry bilaterally.  Positive wheezing the right lung fields.. Abdominal:     General: Bowel sounds are normal. There is no distension.     Palpations: Abdomen is soft. There is no mass.     Tenderness: There is no abdominal tenderness. There is no guarding or rebound.  Musculoskeletal:        General: No tenderness. Normal range of motion.     Cervical back: Normal range of motion and neck supple.  Skin:    General: Skin is warm.  Neurological:     Mental Status: She is alert and oriented to person, place, and time.     Comments: Tremors noted right more than left hand.  Psychiatric:        Mood and Affect: Affect normal.    LABORATORY DATA:  I have reviewed the data as listed Lab Results  Component Value Date   WBC 8.0 02/25/2021   HGB 7.7 (L) 02/25/2021   HCT 23.4 (L) 02/25/2021   MCV 100.9 (H) 02/25/2021   PLT 208 02/25/2021   Recent Labs     01/14/21 0933 02/04/21 0925 02/13/21 0913 02/25/21 0832  NA 134* 136 131* 135  K 4.1 4.4 4.0 4.0  CL 102 101 98 101  CO2 _0 GLUCOSE 106* 144* 211* 172*  BUN _1 CREATININE 1.08* 1.05* 1.03* 1.02*  CALCIUM 8.1* 8.3* 7.9* 8.4*  GFRNONAA 53* 55* 56* 57*  PROT 7.1 7.4  --  7.5  ALBUMIN 2.2* 2.2*  --  2.3*  AST 24 30  --  22  ALT 18 21  --  15  ALKPHOS 75 93  --  86  BILITOT 0.6 0.5  --  0.6    RADIOGRAPHIC STUDIES: I have personally reviewed the radiological images as listed and agreed with the findings in the report. CT CHEST ABDOMEN PELVIS W CONTRAST  Result Date: 02/19/2021 CLINICAL DATA:  Metastatic left lower lobe non-small cell lung cancer, status post chemotherapy and radiation EXAM: CT CHEST, ABDOMEN, AND PELVIS WITH CONTRAST TECHNIQUE: Multidetector CT imaging of the chest, abdomen and pelvis was performed following the standard protocol during bolus administration of intravenous contrast. CONTRAST:  94m OMNIPAQUE IOHEXOL 300 MG/ML SOLN, additional oral enteric contrast COMPARISON:  PET-CT, 11/21/2020, CT chest, 11/06/2020 FINDINGS: CT CHEST FINDINGS Cardiovascular: Right chest port catheter. Normal heart size. Left and right coronary artery calcifications no pericardial effusion. Mediastinum/Nodes: Interval decrease in size of a high left paratracheal/superior mediastinal lymph node, previously FDG PET avid, measuring 0.7 x 0.6 cm, previously 1.0 x 0.8 cm (series 2, image 7). Interval decrease in size of enlarged, previously FDG PET avid pretracheal lymph node, measuring 0.8 x 0.8 cm, previously 1.1 x 1.1 cm (series 2, image 22). Thyroid gland, trachea, and esophagus demonstrate no significant findings. Lungs/Pleura: Unchanged, bandlike post treatment fibrosis  of the perihilar left lung and lingula (series 3, image 61, 70). Unchanged nodules in the anterior left lower lobe measuring 1.3 x 0.6 cm and 0.7 x 0.5 cm (series 3, image 74, 70). Extensive bronchial  plugging in the dependent left lung, with new heterogeneous and ground-glass airspace opacity and centrilobular in the left lower lobe (series 3, image 89, 98). Unchanged 3 mm fissural nodule of the anterior right lower lobe (series 3, image 77). Background of very fine centrilobular pulmonary nodularity. Musculoskeletal: No chest wall mass or suspicious bone lesions identified. CT ABDOMEN PELVIS FINDINGS Hepatobiliary: Slight interval decrease in size of a mass of the peripheral right lobe of the liver, hepatic segment VI, measuring 3.5 x 3.5 cm, previously 4.2 x 4.0 cm (series 2, image 53). Hepatic steatosis. No gallstones, gallbladder wall thickening, or biliary dilatation. Pancreas: Unremarkable. No pancreatic ductal dilatation or surrounding inflammatory changes. Spleen: Normal in size without significant abnormality. Adrenals/Urinary Tract: Adrenal glands are unremarkable. Kidneys are normal, without renal calculi, solid lesion, or hydronephrosis. Bladder is unremarkable. Stomach/Bowel: Stomach is within normal limits. Appendix appears normal. No evidence of bowel wall thickening, distention, or inflammatory changes. Vascular/Lymphatic: Aortic atherosclerosis. No enlarged abdominal or pelvic lymph nodes. Reproductive: Status post hysterectomy. Other: Right inguinal hernia containing predominantly fat and a small portion of the bladder dome (series 2, image 113). No abdominopelvic ascites. Musculoskeletal: No acute or significant osseous findings. IMPRESSION: 1. Interval decrease in size of previously FDG avid mediastinal lymph nodes, consistent with treatment response of nodal metastatic disease. 2. Slight interval decrease in size of a mass of the peripheral right lobe of the liver, consistent with treatment response of hepatic metastatic disease. 3. Unchanged, bandlike post treatment fibrosis of the perihilar left lung and lingula as well as nodules of the adjacent anterior left lower lobe. 4. Extensive  bronchial plugging in the dependent left lung, with new heterogeneous and ground-glass airspace opacity and centrilobular in the left lower lobe. Findings are most consistent with aspiration. 5. Background of very fine centrilobular pulmonary nodularity, consistent with smoking-related respiratory bronchiolitis. 6. Coronary artery disease. Aortic Atherosclerosis (ICD10-I70.0). Electronically Signed   By: Eddie Candle M.D.   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  CEA elevated/CA 19-9 elevated [>5800]; s/p carbo-Keytruda #3- May 16th, 2022-interval decrease in size of the mediastinal lymph nodes; slight decrease in the liver mass; groundglass opacity/left lower lobe [? Aspiration-see below].  # HOLD carboplatin-Keytruda #. 4. Labs today reviewed;  UNacceptable for treatment today sec to anemia/fatigue.  Post 4 cycles recommend-Keytruda maintenance.  #Anemia hemoglobin 7.7-secondary carboplatin; plan 1 unit PRBC transfusion.  Discussed the pros and cons of PRBC transfusion.  Patient agreement for 1 unit.  # COPD/wheezing; -recommend continue inhalers/defer to Dr. Raul Del regarding management.  # port malfunction: Stable  #Poor appetite- weight loss-second underlying malignancy: Recommend Megace.  Discussed regarding risk of blood clots.; discussed re: aspiration precaution.  # Hand tremors: Check thyroid panel*; plan neurology evaluation.  # DISPOSITION:  # HOLD Keytruda-carbo today;  # HOLd tube today.; 1 unit PRBC # follow up in 2 weeks- MD: labs- cbc/cmp/ca-19-9; CEA- carbo-keytruda;D-2 fulphila Dr. .B  # I reviewed the blood work- with the patient in detail; also reviewed the imaging independently [as summarized above]; and with the patient in detail.     All questions were  answered. The patient  knows to call the clinic with any problems, questions or concerns.    Cammie Sickle, MD 02/25/2021 10:27 AM

## 2021-02-25 NOTE — Progress Notes (Signed)
Has been feeling very weak. Appetite is not good. Did get a walker to get around the house. Has been very shaky in arms and hands. Weakness in legs and also feels shaky at times.

## 2021-02-26 ENCOUNTER — Telehealth: Payer: Self-pay | Admitting: Internal Medicine

## 2021-02-26 ENCOUNTER — Telehealth: Payer: Self-pay

## 2021-02-26 ENCOUNTER — Inpatient Hospital Stay: Payer: Medicare Other

## 2021-02-26 LAB — TYPE AND SCREEN
ABO/RH(D): A POS
Antibody Screen: NEGATIVE
Unit division: 0

## 2021-02-26 LAB — BPAM RBC
Blood Product Expiration Date: 202206082359
ISSUE DATE / TIME: 202205231213
Unit Type and Rh: 6200

## 2021-02-26 NOTE — Telephone Encounter (Signed)
On 5/23-spoke to patient's daughter [name] regarding the recommendations from speech pathology-increasing PPI to twice a day; await MBS as ordered for 5/27.   Follow-up as planned.

## 2021-02-26 NOTE — Telephone Encounter (Signed)
Nutrition  Patient called RD and liked [employer] 1.4 shakes.  RD able to order free samples case to be sent to patient's home.  Patient appreciative  Jaclyn Andy B. Zenia Resides, [place], [place] Registered Dietitian [phone] (mobile)

## 2021-03-01 ENCOUNTER — Other Ambulatory Visit: Payer: Self-pay

## 2021-03-01 ENCOUNTER — Ambulatory Visit
Admission: RE | Admit: 2021-03-01 | Discharge: 2021-03-01 | Disposition: A | Discharge status: Home / Self Care | Payer: Medicare Other | Source: Ambulatory Visit | Attending: Internal Medicine | Admitting: Internal Medicine

## 2021-03-01 DIAGNOSIS — T17800A Unspecified foreign body in other parts of respiratory tract causing asphyxiation, initial encounter: Secondary | ICD-10-CM | POA: Diagnosis present

## 2021-03-01 DIAGNOSIS — R059 Cough, unspecified: Secondary | ICD-10-CM

## 2021-03-01 DIAGNOSIS — R1310 Dysphagia, unspecified: Secondary | ICD-10-CM

## 2021-03-01 DIAGNOSIS — T17908A Unspecified foreign body in respiratory tract, part unspecified causing other injury, initial encounter: Secondary | ICD-10-CM | POA: Diagnosis present

## 2021-03-01 NOTE — Progress Notes (Signed)
Modified Barium Swallow Progress Note  Patient Details  Name: Donna Brennan MRN: [id number] Date of Birth: 03-06-1946  Today's Date: 03/01/2021  Modified Barium Swallow completed.  Full report located under Chart Review in the Imaging Section.  Brief recommendations include the following:  Clinical Impression  Pt demonstrates adequate oropharyngeal abilities when consuming nectar thick liquids, thin liquids, puree, graham crackers and whole barium tablet. Penetration and aspiration were not observed during the study. Behaviorally, pt has sensation to effortfully swallow "to get it to go down." Pt does have indentation of the posterior pharyngeal wall from anterior cervical spine osteophytes and prominent cricopharyngeus but bolus was not impeded. Pt does endorse strong family history of esophageal strictures. MD may wish to consider an esophageal assessment if pt is interested.   Swallow Evaluation Recommendations   Recommended Consults: Consider esophageal assessment   SLP Diet Recommendations: Regular solids;Thin liquid   Liquid Administration via: Cup;Straw   Medication Administration: Whole meds with liquid   Supervision: Patient able to self feed   Compensations: Minimize environmental distractions;Slow rate;Small sips/bites   Postural Changes: Remain semi-upright after after feeds/meals (Comment);Seated upright at 90 degrees   Oral Care Recommendations: Oral care BID       Donna Brennan Donna Brennan M.S., CCC-SLP, [place] Office [phone]  Donna Brennan Rutherford Brennan 03/01/2021,2:41 PM

## 2021-03-09 ENCOUNTER — Encounter: Payer: Self-pay | Admitting: Internal Medicine

## 2021-03-11 ENCOUNTER — Encounter: Payer: Self-pay | Admitting: Internal Medicine

## 2021-03-11 ENCOUNTER — Inpatient Hospital Stay: Payer: Medicare Other

## 2021-03-11 ENCOUNTER — Inpatient Hospital Stay: Payer: Medicare Other | Attending: Internal Medicine

## 2021-03-11 ENCOUNTER — Inpatient Hospital Stay (HOSPITAL_BASED_OUTPATIENT_CLINIC_OR_DEPARTMENT_OTHER): Payer: Medicare Other | Admitting: Internal Medicine

## 2021-03-11 VITALS — BP 115/69 | HR 95 | Temp 97.4°F | Resp 18 | Wt 125.2 lb

## 2021-03-11 DIAGNOSIS — Z5112 Encounter for antineoplastic immunotherapy: Secondary | ICD-10-CM | POA: Diagnosis present

## 2021-03-11 DIAGNOSIS — I2510 Atherosclerotic heart disease of native coronary artery without angina pectoris: Secondary | ICD-10-CM | POA: Diagnosis not present

## 2021-03-11 DIAGNOSIS — D649 Anemia, unspecified: Secondary | ICD-10-CM | POA: Diagnosis not present

## 2021-03-11 DIAGNOSIS — J449 Chronic obstructive pulmonary disease, unspecified: Secondary | ICD-10-CM | POA: Diagnosis not present

## 2021-03-11 DIAGNOSIS — R630 Anorexia: Secondary | ICD-10-CM | POA: Insufficient documentation

## 2021-03-11 DIAGNOSIS — R160 Hepatomegaly, not elsewhere classified: Secondary | ICD-10-CM | POA: Insufficient documentation

## 2021-03-11 DIAGNOSIS — Z79899 Other long term (current) drug therapy: Secondary | ICD-10-CM | POA: Insufficient documentation

## 2021-03-11 DIAGNOSIS — R590 Localized enlarged lymph nodes: Secondary | ICD-10-CM | POA: Diagnosis not present

## 2021-03-11 DIAGNOSIS — R251 Tremor, unspecified: Secondary | ICD-10-CM | POA: Insufficient documentation

## 2021-03-11 DIAGNOSIS — C221 Intrahepatic bile duct carcinoma: Secondary | ICD-10-CM

## 2021-03-11 DIAGNOSIS — R0602 Shortness of breath: Secondary | ICD-10-CM | POA: Insufficient documentation

## 2021-03-11 DIAGNOSIS — M549 Dorsalgia, unspecified: Secondary | ICD-10-CM | POA: Diagnosis not present

## 2021-03-11 DIAGNOSIS — Z9221 Personal history of antineoplastic chemotherapy: Secondary | ICD-10-CM | POA: Insufficient documentation

## 2021-03-11 DIAGNOSIS — K4090 Unilateral inguinal hernia, without obstruction or gangrene, not specified as recurrent: Secondary | ICD-10-CM | POA: Insufficient documentation

## 2021-03-11 DIAGNOSIS — Z5189 Encounter for other specified aftercare: Secondary | ICD-10-CM | POA: Insufficient documentation

## 2021-03-11 DIAGNOSIS — Z8719 Personal history of other diseases of the digestive system: Secondary | ICD-10-CM | POA: Diagnosis not present

## 2021-03-11 DIAGNOSIS — C3432 Malignant neoplasm of lower lobe, left bronchus or lung: Secondary | ICD-10-CM | POA: Insufficient documentation

## 2021-03-11 DIAGNOSIS — R058 Other specified cough: Secondary | ICD-10-CM | POA: Insufficient documentation

## 2021-03-11 DIAGNOSIS — F17210 Nicotine dependence, cigarettes, uncomplicated: Secondary | ICD-10-CM | POA: Diagnosis not present

## 2021-03-11 DIAGNOSIS — Z880 Allergy status to penicillin: Secondary | ICD-10-CM | POA: Insufficient documentation

## 2021-03-11 DIAGNOSIS — M2550 Pain in unspecified joint: Secondary | ICD-10-CM | POA: Insufficient documentation

## 2021-03-11 DIAGNOSIS — R634 Abnormal weight loss: Secondary | ICD-10-CM | POA: Diagnosis not present

## 2021-03-11 DIAGNOSIS — R5383 Other fatigue: Secondary | ICD-10-CM | POA: Diagnosis not present

## 2021-03-11 DIAGNOSIS — I700 Atherosclerosis of aorta: Secondary | ICD-10-CM | POA: Diagnosis not present

## 2021-03-11 DIAGNOSIS — Z882 Allergy status to sulfonamides status: Secondary | ICD-10-CM | POA: Insufficient documentation

## 2021-03-11 DIAGNOSIS — Z5111 Encounter for antineoplastic chemotherapy: Secondary | ICD-10-CM | POA: Diagnosis present

## 2021-03-11 DIAGNOSIS — C787 Secondary malignant neoplasm of liver and intrahepatic bile duct: Secondary | ICD-10-CM | POA: Diagnosis not present

## 2021-03-11 DIAGNOSIS — U071 COVID-19: Secondary | ICD-10-CM | POA: Insufficient documentation

## 2021-03-11 DIAGNOSIS — R978 Other abnormal tumor markers: Secondary | ICD-10-CM

## 2021-03-11 LAB — CBC WITH DIFFERENTIAL/PLATELET
Abs Immature Granulocytes: 0.03 10*3/uL (ref 0.00–0.07)
Basophils Absolute: 0 10*3/uL (ref 0.0–0.1)
Basophils Relative: 1 %
Eosinophils Absolute: 0 10*3/uL (ref 0.0–0.5)
Eosinophils Relative: 1 %
HCT: 28.5 % — ABNORMAL LOW (ref 36.0–46.0)
Hemoglobin: 9.5 g/dL — ABNORMAL LOW (ref 12.0–15.0)
Immature Granulocytes: 1 %
Lymphocytes Relative: 18 %
Lymphs Abs: 0.7 10*3/uL (ref 0.7–4.0)
MCH: 33.3 pg (ref 26.0–34.0)
MCHC: 33.3 g/dL (ref 30.0–36.0)
MCV: 100 fL (ref 80.0–100.0)
Monocytes Absolute: 0.6 10*3/uL (ref 0.1–1.0)
Monocytes Relative: 16 %
Neutro Abs: 2.5 10*3/uL (ref 1.7–7.7)
Neutrophils Relative %: 63 %
Platelets: 323 10*3/uL (ref 150–400)
RBC: 2.85 MIL/uL — ABNORMAL LOW (ref 3.87–5.11)
RDW: 18.9 % — ABNORMAL HIGH (ref 11.5–15.5)
WBC: 3.8 10*3/uL — ABNORMAL LOW (ref 4.0–10.5)
nRBC: 0 % (ref 0.0–0.2)

## 2021-03-11 LAB — COMPREHENSIVE METABOLIC PANEL
ALT: 17 U/L (ref 0–44)
AST: 28 U/L (ref 15–41)
Albumin: 2.2 g/dL — ABNORMAL LOW (ref 3.5–5.0)
Alkaline Phosphatase: 68 U/L (ref 38–126)
Anion gap: 11 (ref 5–15)
BUN: 15 mg/dL (ref 8–23)
CO2: 20 mmol/L — ABNORMAL LOW (ref 22–32)
Calcium: 8 mg/dL — ABNORMAL LOW (ref 8.9–10.3)
Chloride: 102 mmol/L (ref 98–111)
Creatinine, Ser: 0.88 mg/dL (ref 0.44–1.00)
GFR, Estimated: >60 mL/min (ref >60)
Glucose, Bld: 90 mg/dL (ref 70–99)
Potassium: 4 mmol/L (ref 3.5–5.1)
Sodium: 133 mmol/L — ABNORMAL LOW (ref 135–145)
Total Bilirubin: 0.3 mg/dL (ref 0.3–1.2)
Total Protein: 7.5 g/dL (ref 6.5–8.1)

## 2021-03-11 MED ORDER — SODIUM CHLORIDE 0.9% FLUSH
10.0000 mL | INTRAVENOUS | Status: DC | PRN
  Administered 2021-03-11: 10 mL via INTRAVENOUS
  Filled 2021-03-11: qty 10

## 2021-03-11 MED ORDER — DEXAMETHASONE SODIUM PHOSPHATE 10 MG/ML IJ SOLN
4.0000 mg | Freq: Once | INTRAMUSCULAR | Status: AC
  Administered 2021-03-11: 4 mg via INTRAVENOUS
  Filled 2021-03-11: qty 1

## 2021-03-11 MED ORDER — SODIUM CHLORIDE 0.9 % IV SOLN
Freq: Once | INTRAVENOUS | Status: AC
  Filled 2021-03-11: qty 250

## 2021-03-11 MED ORDER — SODIUM CHLORIDE 0.9 % IV SOLN
340.0000 mg | Freq: Once | INTRAVENOUS | Status: AC
  Administered 2021-03-11: 340 mg via INTRAVENOUS
  Filled 2021-03-11: qty 34

## 2021-03-11 MED ORDER — SODIUM CHLORIDE 0.9 % IV SOLN
200.0000 mg | Freq: Once | INTRAVENOUS | Status: AC
  Administered 2021-03-11: 200 mg via INTRAVENOUS
  Filled 2021-03-11: qty 8

## 2021-03-11 MED ORDER — HEPARIN SOD (PORK) LOCK FLUSH 100 UNIT/ML IV SOLN
500.0000 [IU] | Freq: Once | INTRAVENOUS | Status: AC
  Administered 2021-03-11: 500 [IU] via INTRAVENOUS
  Filled 2021-03-11: qty 5

## 2021-03-11 MED ORDER — SODIUM CHLORIDE 0.9 % IV SOLN
150.0000 mg | Freq: Once | INTRAVENOUS | Status: AC
  Administered 2021-03-11: 150 mg via INTRAVENOUS
  Filled 2021-03-11: qty 150

## 2021-03-11 MED ORDER — SODIUM CHLORIDE 0.9 % IV SOLN: 4.0000 mg | Freq: Once | INTRAVENOUS | Status: DC

## 2021-03-11 MED ORDER — PALONOSETRON HCL INJECTION 0.25 MG/5ML
0.2500 mg | Freq: Once | INTRAVENOUS | Status: AC
  Administered 2021-03-11: 0.25 mg via INTRAVENOUS
  Filled 2021-03-11: qty 5

## 2021-03-11 MED ORDER — HEPARIN SOD (PORK) LOCK FLUSH 100 UNIT/ML IV SOLN
INTRAVENOUS | Status: AC
  Filled 2021-03-11: qty 5

## 2021-03-11 MED ORDER — HEPARIN SOD (PORK) LOCK FLUSH 100 UNIT/ML IV SOLN
500.0000 [IU] | Freq: Once | INTRAVENOUS | Status: DC | PRN
  Filled 2021-03-11: qty 5

## 2021-03-11 NOTE — Progress Notes (Signed)
Patient reports the Megace has helped with improving her appetite.

## 2021-03-11 NOTE — Progress Notes (Signed)
[facility] NOTE  Patient Care Team: Adin Hector, MD as PCP - General (Internal Medicine) Telford Nab, RN as Oncology Nurse Navigator Erby Pian, MD as Referring Physician (Specialist) Cammie Sickle, MD as Consulting Physician (Internal Medicine) Noreene Filbert, MD as Referring Physician (Radiation Oncology)  CHIEF COMPLAINTS/PURPOSE OF CONSULTATION: Lung cancer  #  Oncology History Overview Note  #FEB 2021- LUNG, LEFT LOWER LOBE; ENB BRONCHOALVEOLAR LAVAGE:  - SUSPICIOUS FOR MALIGNANCY. 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 LUNG ORIGIN vs cholangio- CA 19-9/CEA > 5000.April 7th, 2022-cancer type ID-90% "probability cervix adenocarcinoma-cannot exclude lung 5%; other tumor types less than 5% including gastroesophageal adenocarcinoma  # Cycle #1- CarboAlimta [waiting on NGS]; #2- carbo-Keytruda [stopped Alimta]; GCSF; s/p 3 cycles of carbo-keytruda- May 16th CT-Partial response  # NGS/MOLECULAR TESTS: MARCH 2022- PDL-1 TPS=0%; TMB-HIGH; No target**    # PALLIATIVE CARE EVALUATION:  # PAIN MANAGEMENT:    DIAGNOSIS: Lung adeno vs cholangio  STAGE:  IV       ;  GOALS: pallaitive  CURRENT/MOST RECENT THERAPY : carbo-keytruda    Primary cancer of left lower lobe of lung ([employer])  12/21/2019 Initial Diagnosis   Primary cancer of left lower lobe of lung ([place])   12/04/2020 Cancer Staging   Staging form: Lung, AJCC 8th Edition - Clinical: Stage IVA (cTX, cN2, pM1b) - Signed by Cammie Sickle, MD on 12/04/2020  Histopathologic type: Adenocarcinoma, NOS    12/14/2020 -  Chemotherapy    Patient is on Treatment Plan: LUNG NSCLC KEYTRUDA / CARBOPLATIN Q21D X 1 CYCLES         HISTORY OF PRESENTING ILLNESS:  Donna Brennan 75 y.o.  female  pleasant patient with adenocarcinoma-s/p liver biopsy [?  Recurrent lung cancer versus cholangiocarcinoma] thought clinically lung cancer status post carbo-Keytruda is here for follow-up.  In the interim patient underwent modified barium swallow/negative for any aspiration.  Patient's chemotherapy was held 2 weeks ago because of worsening anemia/fatigue.  S/p PRBC of transfusion.  Patient's anemia/fatigue is improved.  Denies any nausea vomiting abdominal pain.  Continues to have episodes of coughing/wheezing intermittently.  No swelling in legs.  Review of Systems  Constitutional:  Positive for malaise/fatigue. Negative for chills, diaphoresis, fever and weight loss.  HENT:  Negative for nosebleeds and sore throat.   Eyes:  Negative for double vision.  Respiratory:  Positive for cough and shortness of breath. Negative for hemoptysis, sputum production and wheezing.   Cardiovascular:  Negative for chest pain, palpitations, orthopnea and leg swelling.  Gastrointestinal:  Negative for abdominal pain, blood in stool, constipation, diarrhea, heartburn, melena, nausea and vomiting.  Genitourinary:  Negative for dysuria, frequency and urgency.  Musculoskeletal:  Positive for back pain and joint pain.  Skin: Negative.  Negative for itching and rash.  Neurological:  Negative for dizziness, tingling, focal weakness, weakness and headaches.  Endo/Heme/Allergies:  Does not bruise/bleed easily.  Psychiatric/Behavioral:  Negative for depression. The patient is not nervous/anxious and does not have insomnia.     MEDICAL HISTORY:  Past Medical History:  Diagnosis Date   CKD (chronic kidney disease), stage IV (HCC)    Colon polyps    COPD (chronic obstructive pulmonary disease) ([place])    Diabetes mellitus without complication (HCC)    GERD (gastroesophageal reflux disease)    Headache    Hepatitis  when in 4th or 5th grade.  Resolved   Hyperlipidemia    IBS (irritable bowel syndrome)    Peptic  ulcer    Tobacco abuse    Vertigo    No "bad" episodes in over 1 year   Wears dentures    partial ujpper and lower    SURGICAL HISTORY: Past Surgical History:  Procedure Laterality Date   ABDOMINAL HYSTERECTOMY     carbuncle removal     urethra   CATARACT EXTRACTION W/PHACO Left 10/29/2020   Procedure: CATARACT EXTRACTION PHACO AND INTRAOCULAR LENS PLACEMENT (IOC) LEFT DIABETIC 1051 01:00.0 ;  Surgeon: Eulogio Bear, MD;  Location: [facility];  Service: Ophthalmology;  Laterality: Left;  Diabetic - insulin   CATARACT EXTRACTION W/PHACO Right 11/19/2020   Procedure: CATARACT EXTRACTION PHACO AND INTRAOCULAR LENS PLACEMENT (IOC) RIGHT DIABETIC;  Surgeon: Eulogio Bear, MD;  Location: [facility];  Service: Ophthalmology;  Laterality: Right;  16.54 1:40.0   COLONOSCOPY     COLONOSCOPY WITH PROPOFOL N/A 11/05/2017   Procedure: COLONOSCOPY WITH PROPOFOL;  Surgeon: Lollie Sails, MD;  Location: [facility] ENDOSCOPY;  Service: Endoscopy;  Laterality: N/A;   ESOPHAGOGASTRODUODENOSCOPY     ESOPHAGOGASTRODUODENOSCOPY (EGD) WITH PROPOFOL N/A 11/05/2017   Procedure: ESOPHAGOGASTRODUODENOSCOPY (EGD) WITH PROPOFOL;  Surgeon: Lollie Sails, MD;  Location: [facility] ENDOSCOPY;  Service: Endoscopy;  Laterality: N/A;   IR IMAGING GUIDED PORT INSERTION  12/13/2020   IR IMAGING GUIDED PORT INSERTION  01/25/2021   VIDEO BRONCHOSCOPY WITH ENDOBRONCHIAL NAVIGATION N/A 12/02/2019   Procedure: VIDEO BRONCHOSCOPY WITH ENDOBRONCHIAL NAVIGATION;  Surgeon: Ottie Glazier, MD;  Location: [facility]S;  Service: Thoracic;  Laterality: N/A;   VIDEO BRONCHOSCOPY WITH ENDOBRONCHIAL ULTRASOUND N/A 12/02/2019   Procedure: VIDEO BRONCHOSCOPY WITH ENDOBRONCHIAL ULTRASOUND;  Surgeon: Ottie Glazier, MD;  Location: [facility];  Service: Thoracic;  Laterality: N/A;    SOCIAL HISTORY: Social History   Socioeconomic History   Marital status: Married    Spouse name: wayne   Number of children: 6   Years of  education: Not on file   Highest education level: Not on file  Occupational History   Not on file  Tobacco Use   Smoking status: Every Day    Packs/day: 0.25    Years: 45.00    Pack years: 11.25    Types: Cigarettes    Last attempt to quit: 08/06/2020    Years since quitting: 0.6   Smokeless tobacco: Never  Vaping Use   Vaping Use: Never used  Substance and Sexual Activity   Alcohol use: No   Drug use: No   Sexual activity: Not on file  Other Topics Concern   Not on file  Social History Narrative   Smoking "all life"; 4 cig/day; rare alcohol; worked in [employer]; 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 [place].     Social Determinants of Health   Financial Resource Strain: Not on file  Food Insecurity: Not on file  Transportation Needs: Not on file  Physical Activity: Not on file  Stress: Not on file  Social Connections: Not on file  Intimate Partner Violence: Not on file    FAMILY HISTORY: Family History  Problem Relation Age of Onset   Breast cancer Neg Hx     ALLERGIES:  is allergic to penicillins, metformin, and sulfa antibiotics.  MEDICATIONS:  No current facility-administered medications for this visit.   No current outpatient medications on file.   Facility-Administered Medications Ordered in Other Visits  Medication Dose Route Frequency Provider Last  Rate Last Admin   acetaminophen (TYLENOL) tablet 650 mg  650 mg Oral Q6H PRN Agbata, Tochukwu, MD       Or   acetaminophen (TYLENOL) suppository 650 mg  650 mg Rectal Q6H PRN Agbata, Tochukwu, MD       albuterol (VENTOLIN HFA) 108 (90 Base) MCG/ACT inhaler 1-2 puff  1-2 puff Inhalation Q6H PRN Agbata, Tochukwu, MD   2 puff at 03/18/21 0700   ascorbic acid (VITAMIN C) tablet 500 mg  500 mg Oral Daily Agbata, Tochukwu, MD   500 mg at 03/17/21 0913   azithromycin (ZITHROMAX) 500 mg in sodium chloride 0.9 % 250 mL IVPB  500 mg Intravenous Q24H Agbata, Tochukwu, MD   Stopped at 03/17/21 1914   cefTRIAXone  (ROCEPHIN) 2 g in sodium chloride 0.9 % 100 mL IVPB  2 g Intravenous Q24H Agbata, Tochukwu, MD 200 mL/hr at 03/17/21 1857 2 g at 03/17/21 1857   chlorpheniramine-HYDROcodone (TUSSIONEX) 10-8 MG/5ML suspension 5 mL  5 mL Oral QHS PRN Agbata, Tochukwu, MD   5 mL at 03/17/21 2159   enoxaparin (LOVENOX) injection 40 mg  40 mg Subcutaneous Q24H Benita Gutter, RPH   40 mg at 03/17/21 2201   ezetimibe (ZETIA) tablet 10 mg  10 mg Oral Daily Agbata, Tochukwu, MD   10 mg at 76/28/31 5176   folic acid (FOLVITE) tablet 1 mg  1 mg Oral Daily Agbata, Tochukwu, MD   1 mg at 03/17/21 0913   hyoscyamine (LEVBID) 0.375 MG 12 hr tablet 0.375 mg  0.375 mg Oral Daily PRN Agbata, Tochukwu, MD       insulin aspart (novoLOG) injection 0-9 Units  0-9 Units Subcutaneous TID WC Agbata, Tochukwu, MD   5 Units at 03/18/21 0802   insulin detemir (LEVEMIR) injection 4 Units  0.075 Units/kg Subcutaneous BID Agbata, Tochukwu, MD   4 Units at 03/17/21 2200   lidocaine-prilocaine (EMLA) cream 1 application  1 application Topical Once Agbata, Tochukwu, MD       megestrol (MEGACE) 400 MG/10ML suspension 625 mg  625 mg Oral Daily Jennye Boroughs, MD   625 mg at 03/17/21 1109   methylPREDNISolone sodium succinate (SOLU-MEDROL) 125 mg/2 mL injection 60 mg  60 mg Intravenous Q12H Agbata, Tochukwu, MD   60 mg at 03/18/21 0526   Followed by   Derrill Memo ON 03/20/2021] predniSONE (DELTASONE) tablet 50 mg  50 mg Oral Daily Agbata, Tochukwu, MD       mometasone-formoterol (DULERA) 200-5 MCG/ACT inhaler 2 puff  2 puff Inhalation BID Agbata, Tochukwu, MD   2 puff at 03/18/21 0757   multivitamin with minerals tablet 1 tablet  1 tablet Oral Daily Agbata, Tochukwu, MD   1 tablet at 03/17/21 0913   ondansetron (ZOFRAN) tablet 4 mg  4 mg Oral Q6H PRN Agbata, Tochukwu, MD       Or   ondansetron (ZOFRAN) injection 4 mg  4 mg Intravenous Q6H PRN Agbata, Tochukwu, MD       pantoprazole (PROTONIX) EC tablet 40 mg  40 mg Oral Daily Agbata, Tochukwu, MD   40  mg at 03/17/21 0913   remdesivir 100 mg in sodium chloride 0.9 % 100 mL IVPB  100 mg Intravenous Daily Agbata, Tochukwu, MD   Stopped at 03/17/21 0951   rosuvastatin (CRESTOR) tablet 40 mg  40 mg Oral QPM Agbata, Tochukwu, MD   40 mg at 03/17/21 1814   sodium chloride flush (NS) 0.9 % injection 10 mL  10 mL Intravenous PRN Charlaine Dalton  R, MD   10 mL at 12/24/20 0842   sodium chloride flush (NS) 0.9 % injection 10 mL  10 mL Intravenous PRN Verlon Au, NP   10 mL at 12/28/20 1415   vitamin B-12 (CYANOCOBALAMIN) tablet 1,000 mcg  1,000 mcg Oral Daily Agbata, Tochukwu, MD   1,000 mcg at 03/17/21 0913   zinc sulfate capsule 220 mg  220 mg Oral Daily Agbata, Tochukwu, MD   220 mg at 03/17/21 0913      .  PHYSICAL EXAMINATION: ECOG PERFORMANCE STATUS: 0 - Asymptomatic  Vitals:   03/11/21 0921  BP: 115/69  Pulse: 95  Resp: 18  Temp: (!) 97.4 F (36.3 C)  SpO2: 98%   Filed Weights   03/11/21 0921  Weight: 125 lb 3.2 oz (56.8 kg)    Physical Exam Constitutional:      Comments: Thin built Caucasian female patient.  She is ambulating independently.  Accompanied by daughter.  HENT:     Head: Normocephalic and atraumatic.     Mouth/Throat:     Pharynx: No oropharyngeal exudate.  Eyes:     Pupils: Pupils are equal, round, and reactive to light.  Cardiovascular:     Rate and Rhythm: Normal rate and regular rhythm.  Pulmonary:     Effort: No respiratory distress.     Breath sounds: No wheezing.     Comments: Decreased air entry bilaterally.  Positive wheezing the right lung fields.. Abdominal:     General: Bowel sounds are normal. There is no distension.     Palpations: Abdomen is soft. There is no mass.     Tenderness: no abdominal tenderness There is no guarding or rebound.  Musculoskeletal:        General: No tenderness. Normal range of motion.     Cervical back: Normal range of motion and neck supple.  Skin:    General: Skin is warm.  Neurological:     Mental  Status: She is alert and oriented to person, place, and time.     Comments: Tremors noted right more than left hand.  Psychiatric:        Mood and Affect: Affect normal.   LABORATORY DATA:  I have reviewed the data as listed Lab Results  Component Value Date   WBC 3.0 (L) 03/18/2021   HGB 7.6 (L) 03/18/2021   HCT 23.0 (L) 03/18/2021   MCV 101.3 (H) 03/18/2021   PLT 142 (L) 03/18/2021   Recent Labs    03/19/2021 1225 03/17/21 0648 03/18/21 0521  NA 134* 137 137  K 4.5 3.6 3.2*  CL 101 111 110  CO2 19* 20* 21*  GLUCOSE 293* 224* 297*  BUN 29* 28* 29*  CREATININE 1.50* 1.07* 1.04*  CALCIUM 8.4* 7.5* 7.6*  GFRNONAA 36* 54* 56*  PROT 8.3* 6.0* 5.9*  ALBUMIN 2.3* 1.7* 1.6*  AST 39 26 26  ALT _0 ALKPHOS 113 72 67  BILITOT 0.9 0.4 0.4    RADIOGRAPHIC STUDIES: I have personally reviewed the radiological images as listed and agreed with the findings in the report. CT Angio Chest PE W and/or Wo Contrast  Result Date: 03/09/2021 CLINICAL DATA:  Weakness. Cough and shortness of breath. COVID positive. History of COPD. EXAM: CT ANGIOGRAPHY CHEST WITH CONTRAST TECHNIQUE: Multidetector CT imaging of the chest was performed using the standard protocol during bolus administration of intravenous contrast. Multiplanar CT image reconstructions and MIPs were obtained to evaluate the vascular anatomy. CONTRAST:  39m OMNIPAQUE IOHEXOL 350  MG/ML SOLN COMPARISON:  Plain film of earlier today.  Chest CT of 02/19/2021. FINDINGS: Cardiovascular: The quality of this exam for evaluation of pulmonary embolism is good. No evidence of pulmonary embolism. Right Port-A-Cath tip low SVC. Aortic atherosclerosis. Normal heart size, without pericardial effusion. Three vessel coronary artery calcification. Mediastinum/Nodes: No supraclavicular adenopathy. Index precarinal 7 mm node on 36/4 is similar to 8 mm on the prior. A node within the subcarinal station with extension into the azygoesophageal recess  measures 1.4 cm on 44/4 versus 7 mm on the prior exam (when remeasured). Left perihilar soft tissue thickening which is likely radiation induced. No well-defined adenopathy. Lungs/Pleura: Trace left pleural fluid. Left endobronchial compression is felt to be similar to on the prior. Worsening left lower lobe aeration with progressive airspace and ground-glass opacity. Development of bilateral, right greater than left peripheral predominant ground-glass. Similar dependent lingular consolidation. Lateral left lower lobe 7 mm lung nodule is unchanged on 52/6. The more central anterior left lower lobe nodule is likely obscured by airspace disease. Upper Abdomen: Right hepatic lobe treated metastasis is not well evaluated secondary to bolus timing. High left hepatic lobe cysts. Normal imaged portions of the spleen, stomach, pancreas, gallbladder, kidneys, adrenal glands. Abdominal aortic and branch vessel atherosclerosis. Musculoskeletal: No acute osseous abnormality. Review of the MIP images confirms the above findings. IMPRESSION: 1.  No evidence of pulmonary embolism. 2. Development of multifocal peripheral predominant ground-glass opacities, highly suspicious for COVID-19 pneumonia. 3. Left lower lobe pulmonary opacities are slightly progressive and likely represent a combination of COVID-19 pneumonia and chronic infection or aspiration. 4. Similar appearance of probable radiation induced consolidation within the lingula and anterior left lower lobe. Anterior left lower lobe nodularity is similar. 5. Subcarinal adenopathy, mild and new since 02/18/2021. Most likely reactive. Metastatic disease could look similar. Recommend attention on follow-up. 6. Coronary artery atherosclerosis. Aortic Atherosclerosis (ICD10-I70.0). 7. Right hepatic lobe metastasis, suboptimally evaluated secondary to bolus timing. Electronically Signed   By: Abigail Miyamoto M.D.   On: 03/28/2021 14:56   DG SWALLOW FUNC OP MEDICARE SPEECH  PATH  Result Date: 03/01/2021 Objective Swallowing Evaluation: Type of Study: MBS-Modified Barium Swallow Study  Patient Details Name: Donna Brennan MRN: [id number] Date of Birth: July 15, 1946 Today's Date: 03/01/2021 Time: SLP Start Time (ACUTE ONLY): 1330 -SLP Stop Time (ACUTE ONLY): 1400 SLP Time Calculation (min) (ACUTE ONLY): 30 min Past Medical History: Past Medical History: Diagnosis Date  CKD (chronic kidney disease), stage IV (HCC)   Colon polyps   COPD (chronic obstructive pulmonary disease) ([place])   Diabetes mellitus without complication (HCC)   GERD (gastroesophageal reflux disease)   Headache   Hepatitis   when in 4th or 5th grade.  Resolved  Hyperlipidemia   IBS (irritable bowel syndrome)   Peptic ulcer   Tobacco abuse   Vertigo   No "bad" episodes in over 1 year  Wears dentures   partial ujpper and lower Past Surgical History: Past Surgical History: Procedure Laterality Date  ABDOMINAL HYSTERECTOMY    carbuncle removal    urethra  CATARACT EXTRACTION W/PHACO Left 10/29/2020  Procedure: CATARACT EXTRACTION PHACO AND INTRAOCULAR LENS PLACEMENT (IOC) LEFT DIABETIC 1051 01:00.0 ;  Surgeon: Eulogio Bear, MD;  Location: [facility];  Service: Ophthalmology;  Laterality: Left;  Diabetic - insulin  CATARACT EXTRACTION W/PHACO Right 11/19/2020  Procedure: CATARACT EXTRACTION PHACO AND INTRAOCULAR LENS PLACEMENT (IOC) RIGHT DIABETIC;  Surgeon: Eulogio Bear, MD;  Location: [facility];  Service: Ophthalmology;  Laterality:  Right;  16.54 1:40.0  COLONOSCOPY    COLONOSCOPY WITH PROPOFOL N/A 11/05/2017  Procedure: COLONOSCOPY WITH PROPOFOL;  Surgeon: Lollie Sails, MD;  Location: [facility] ENDOSCOPY;  Service: Endoscopy;  Laterality: N/A;  ESOPHAGOGASTRODUODENOSCOPY    ESOPHAGOGASTRODUODENOSCOPY (EGD) WITH PROPOFOL N/A 11/05/2017  Procedure: ESOPHAGOGASTRODUODENOSCOPY (EGD) WITH PROPOFOL;  Surgeon: Lollie Sails, MD;  Location[facility] ENDOSCOPY;  Service: Endoscopy;  Laterality: N/A;  IR  IMAGING GUIDED PORT INSERTION  12/13/2020  IR IMAGING GUIDED PORT INSERTION  01/25/2021  VIDEO BRONCHOSCOPY WITH ENDOBRONCHIAL NAVIGATION N/A 12/02/2019  Procedure: VIDEO BRONCHOSCOPY WITH ENDOBRONCHIAL NAVIGATION;  Surgeon: Ottie Glazier, MD;  Location: [facility];  Service: Thoracic;  Laterality: N/A;  VIDEO BRONCHOSCOPY WITH ENDOBRONCHIAL ULTRASOUND N/A 12/02/2019  Procedure: VIDEO BRONCHOSCOPY WITH ENDOBRONCHIAL ULTRASOUND;  Surgeon: Ottie Glazier, MD;  Location: [facility]S;  Service: Thoracic;  Laterality: N/A; HPI: Pt is a 75 year old female who was referred by Dr Rogue Bussing d/t difficulty swallowing. Pt is currently undergoing treatment adenocarcinoma-s/p liver biopsy (?  Recurrent lung cancer versus cholangiocarcinoma) thought clinically lung cancer status post carbo-Keytruda.  Subjective: pt pleasant, good historian Assessment / Plan / Recommendation CHL IP CLINICAL IMPRESSIONS 03/01/2021 Clinical Impression Pt demonstrates adequate oropharyngeal abilities when consuming nectar thick liquids, thin liquids, puree, graham crackers and whole barium tablet. Penetration and aspiration were not observed during the study. Behaviorally, pt has sensation to effortfully swallow "to get it to go down." Pt does have indentation of the posterior pharyngeal wall from anterior cervical spine osteophytes and prominent cricopharyngeus but bolus was not impeded. Pt does endorse strong family history of esophageal strictures. MD may wish to consider an eosphageal assessment if pt is interested. SLP Visit Diagnosis Dysphagia, unspecified (R13.10) Attention and concentration deficit following -- Frontal lobe and executive function deficit following -- Impact on safety and function No limitations   CHL IP TREATMENT RECOMMENDATION 03/01/2021 Treatment Recommendations No treatment recommended at this time   No flowsheet data found. CHL IP DIET RECOMMENDATION 03/01/2021 SLP Diet Recommendations Regular solids;Thin liquid Liquid  Administration via Cup;Straw Medication Administration Whole meds with liquid Compensations Minimize environmental distractions;Slow rate;Small sips/bites Postural Changes Remain semi-upright after after feeds/meals (Comment);Seated upright at 90 degrees   CHL IP OTHER RECOMMENDATIONS 03/01/2021 Recommended Consults Consider esophageal assessment Oral Care Recommendations Oral care BID Other Recommendations --   CHL IP FOLLOW UP RECOMMENDATIONS 03/01/2021 Follow up Recommendations None   No flowsheet data found.     CHL IP ORAL PHASE 03/01/2021 Oral Phase WFL Oral - Pudding Teaspoon -- Oral - Pudding Cup -- Oral - Honey Teaspoon -- Oral - Honey Cup -- Oral - Nectar Teaspoon -- Oral - Nectar Cup -- Oral - Nectar Straw -- Oral - Thin Teaspoon -- Oral - Thin Cup -- Oral - Thin Straw -- Oral - Puree -- Oral - Mech Soft -- Oral - Regular -- Oral - Multi-Consistency -- Oral - Pill -- Oral Phase - Comment --  CHL IP PHARYNGEAL PHASE 03/01/2021 Pharyngeal Phase WFL Pharyngeal- Pudding Teaspoon -- Pharyngeal -- Pharyngeal- Pudding Cup -- Pharyngeal -- Pharyngeal- Honey Teaspoon -- Pharyngeal -- Pharyngeal- Honey Cup -- Pharyngeal -- Pharyngeal- Nectar Teaspoon -- Pharyngeal -- Pharyngeal- Nectar Cup -- Pharyngeal -- Pharyngeal- Nectar Straw -- Pharyngeal -- Pharyngeal- Thin Teaspoon -- Pharyngeal -- Pharyngeal- Thin Cup -- Pharyngeal -- Pharyngeal- Thin Straw -- Pharyngeal -- Pharyngeal- Puree -- Pharyngeal -- Pharyngeal- Mechanical Soft -- Pharyngeal -- Pharyngeal- Regular -- Pharyngeal -- Pharyngeal- Multi-consistency -- Pharyngeal -- Pharyngeal- Pill -- Pharyngeal -- Pharyngeal Comment --  CHL IP CERVICAL ESOPHAGEAL PHASE 03/01/2021 Cervical Esophageal Phase (No Data) Pudding Teaspoon -- Pudding Cup -- Honey Teaspoon -- Honey Cup -- Nectar Teaspoon -- Nectar Cup -- Nectar Straw -- Thin Teaspoon -- Thin Cup -- Thin Straw -- Puree -- Mechanical Soft -- Regular -- Multi-consistency -- Pill -- Cervical Esophageal Comment -- Happi  B. Rutherford Nail M.S., CCC-SLP, [place] Office [phone] Happi Rutherford Nail 03/01/2021, 2:42 PM            CLINICAL DATA:  Possible aspiration EXAM: MODIFIED BARIUM SWALLOW TECHNIQUE: Different consistencies of barium were administered orally to the patient by the Speech Pathologist. Imaging of the pharynx was performed in the lateral projection. The radiologist was present in the fluoroscopy room for this study, providing personal supervision. FLUOROSCOPY TIME:  Fluoroscopy Time:  1 minutes 36 seconds Radiation Exposure Index (if provided by the fluoroscopic device): 3.5 mGy COMPARISON:  None. FINDINGS: No aspiration with any attempted consistency. There is indentation of the posterior pharyngeal wall from anterior cervical spine osteophytes and prominent cricopharyngeus without holdup of contrast. IMPRESSION: No aspiration. Please refer to the Speech Pathologists report for complete details and recommendations. Electronically Signed   By: Macy Mis M.D.   On: 03/01/2021 13:53   CT CHEST ABDOMEN PELVIS W CONTRAST  Result Date: 02/19/2021 CLINICAL DATA:  Metastatic left lower lobe non-small cell lung cancer, status post chemotherapy and radiation EXAM: CT CHEST, ABDOMEN, AND PELVIS WITH CONTRAST TECHNIQUE: Multidetector CT imaging of the chest, abdomen and pelvis was performed following the standard protocol during bolus administration of intravenous contrast. CONTRAST:  67m OMNIPAQUE IOHEXOL 300 MG/ML SOLN, additional oral enteric contrast COMPARISON:  PET-CT, 11/21/2020, CT chest, 11/06/2020 FINDINGS: CT CHEST FINDINGS Cardiovascular: Right chest port catheter. Normal heart size. Left and right coronary artery calcifications no pericardial effusion. Mediastinum/Nodes: Interval decrease in size of a high left paratracheal/superior mediastinal lymph node, previously FDG PET avid, measuring 0.7 x 0.6 cm, previously 1.0 x 0.8 cm (series 2, image 7). Interval decrease  in size of enlarged, previously FDG PET avid pretracheal lymph node, measuring 0.8 x 0.8 cm, previously 1.1 x 1.1 cm (series 2, image 22). Thyroid gland, trachea, and esophagus demonstrate no significant findings. Lungs/Pleura: Unchanged, bandlike post treatment fibrosis of the perihilar left lung and lingula (series 3, image 61, 70). Unchanged nodules in the anterior left lower lobe measuring 1.3 x 0.6 cm and 0.7 x 0.5 cm (series 3, image 74, 70). Extensive bronchial plugging in the dependent left lung, with new heterogeneous and ground-glass airspace opacity and centrilobular in the left lower lobe (series 3, image 89, 98). Unchanged 3 mm fissural nodule of the anterior right lower lobe (series 3, image 77). Background of very fine centrilobular pulmonary nodularity. Musculoskeletal: No chest wall mass or suspicious bone lesions identified. CT ABDOMEN PELVIS FINDINGS Hepatobiliary: Slight interval decrease in size of a mass of the peripheral right lobe of the liver, hepatic segment VI, measuring 3.5 x 3.5 cm, previously 4.2 x 4.0 cm (series 2, image 53). Hepatic steatosis. No gallstones, gallbladder wall thickening, or biliary dilatation. Pancreas: Unremarkable. No pancreatic ductal dilatation or surrounding inflammatory changes. Spleen: Normal in size without significant abnormality. Adrenals/Urinary Tract: Adrenal glands are unremarkable. Kidneys are normal, without renal calculi, solid lesion, or hydronephrosis. Bladder is unremarkable. Stomach/Bowel: Stomach is within normal limits. Appendix appears normal. No evidence of bowel wall thickening, distention, or inflammatory changes. Vascular/Lymphatic: Aortic atherosclerosis. No enlarged abdominal or pelvic lymph nodes. Reproductive: Status post hysterectomy. Other: Right inguinal hernia  containing predominantly fat and a small portion of the bladder dome (series 2, image 113). No abdominopelvic ascites. Musculoskeletal: No acute or significant osseous findings.  IMPRESSION: 1. Interval decrease in size of previously FDG avid mediastinal lymph nodes, consistent with treatment response of nodal metastatic disease. 2. Slight interval decrease in size of a mass of the peripheral right lobe of the liver, consistent with treatment response of hepatic metastatic disease. 3. Unchanged, bandlike post treatment fibrosis of the perihilar left lung and lingula as well as nodules of the adjacent anterior left lower lobe. 4. Extensive bronchial plugging in the dependent left lung, with new heterogeneous and ground-glass airspace opacity and centrilobular in the left lower lobe. Findings are most consistent with aspiration. 5. Background of very fine centrilobular pulmonary nodularity, consistent with smoking-related respiratory bronchiolitis. 6. Coronary artery disease. Aortic Atherosclerosis (ICD10-I70.0). Electronically Signed   By: Eddie Candle M.D.   On: 02/19/2021 16:22   DG Chest Portable 1 View  Result Date: 03/07/2021 CLINICAL DATA:  Shortness of breath with productive cough and weakness. COVID-19 positive this morning. EXAM: PORTABLE CHEST 1 VIEW COMPARISON:  12/28/2020 and chest CT 02/18/2021 FINDINGS: Right IJ Port-A-Cath unchanged. Lungs are adequately inflated demonstrate patchy density over the left mid to lower lung which may be slightly worse in the left base. Extensive bilateral costochondral calcification. Cardiomediastinal silhouette and remainder of the exam is unchanged. IMPRESSION: Chronic patchy density over the left mid to lower lung. This may be slightly worse over the left base which may be due to atelectasis or early superimposed infection. Electronically Signed   By: Marin Olp M.D.   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  CEA elevated/CA  19-9 elevated [>5800]; s/p carbo-Keytruda #3- May 16th, 2022-interval decrease in size of the mediastinal lymph nodes; slight decrease in the liver mass; groundglass opacity/left lower lobe [? Aspiration-see below].  # Chemo today- carboplatin-Keytruda #. 4. Labs today reviewed;  Post 4 cycles recommend-Keytruda maintenance.  #Anemia hemoglobin 9.5- secondary carboplatin; s/p 1 unit PRBC transfusion- monitor closely.   # COPD/wheezing;  continue inhalers/defer to Dr. Raul Del regarding management.  Question aspiration-reviewed modified barium swallow-negative for aspiration.  Discussed with speech pathology.  # port malfunction: Stable  #Poor appetite- weight loss-second underlying malignancy: Recommend Megace.  Discussed regarding risk of blood clots.; discussed re: aspiration precaution.  # Hand tremors with movement-: TSH-normal.; HOLD neurology evaluation.   # DISPOSITION:  # proceed with Keytruda-carbo today;  # in 10 days- labs- cbc/bmp;HOLd tube today.; possible 1 unit PRBC # follow up in 3 weeks- MD: labs- cbc/cmp/ca-19-9; CEA- carbo-keytruda-Dr. .B   All questions were answered. The patient knows to call the clinic with any problems, questions or concerns.    Cammie Sickle, MD 03/18/2021 8:35 AM

## 2021-03-11 NOTE — Patient Instructions (Signed)
[facility]     Discharge Instructions: Thank you for choosing [facility] to provide your oncology and hematology care.  If you have a lab appointment with the [facility], please go directly to the [facility] and check in at the registration area.  Wear comfortable clothing and clothing appropriate for easy access to any Portacath or PICC line.   We strive to give you quality time with your provider. You may need to reschedule your appointment if you arrive late (15 or more minutes).  Arriving late affects you and other patients whose appointments are after yours.  Also, if you miss three or more appointments without notifying the office, you may be dismissed from the clinic at the provider's discretion.      For prescription refill requests, have your pharmacy contact our office and allow 72 hours for refills to be completed.    Today you received the following chemotherapy and/or immunotherapy agents - Keytruda, Carboplatin      To help prevent nausea and vomiting after your treatment, we encourage you to take your nausea medication as directed.  BELOW ARE SYMPTOMS THAT SHOULD BE REPORTED IMMEDIATELY: . *FEVER GREATER THAN 100.4 F (38 C) OR HIGHER . *CHILLS OR SWEATING . *NAUSEA AND VOMITING THAT IS NOT CONTROLLED WITH YOUR NAUSEA MEDICATION . *UNUSUAL SHORTNESS OF BREATH . *UNUSUAL BRUISING OR BLEEDING . *URINARY PROBLEMS (pain or burning when urinating, or frequent urination) . *BOWEL PROBLEMS (unusual diarrhea, constipation, pain near the anus) . TENDERNESS IN MOUTH AND THROAT WITH OR WITHOUT PRESENCE OF ULCERS (sore throat, sores in mouth, or a toothache) . UNUSUAL RASH, SWELLING OR PAIN  . UNUSUAL VAGINAL DISCHARGE OR ITCHING   Items with * indicate a potential emergency and should be followed up as soon as possible or go to the Emergency Department if any problems should occur.  Please show the CHEMOTHERAPY ALERT CARD or  IMMUNOTHERAPY ALERT CARD at check-in to the Emergency Department and triage nurse.  Should you have questions after your visit or need to cancel or reschedule your appointment, please contact [facility]  [phone] and follow the prompts.  Office hours are 8:00 a.m. to 4:30 p.m. Monday - Friday. Please note that voicemails left after 4:00 p.m. may not be returned until the following business day.  We are closed weekends and major holidays. You have access to a nurse at all times for urgent questions. Please call the main number to the clinic [phone] and follow the prompts.  For any non-urgent questions, you may also contact your provider using MyChart. We now offer e-Visits for anyone 48 and older to request care online for non-urgent symptoms. For details visit [email or web address].   Also download the MyChart app! Go to the app store, search "MyChart", open the app, select [facility], and log in with your MyChart username and password.  Due to Covid, a mask is required upon entering the hospital/clinic. If you do not have a mask, one will be given to you upon arrival. For doctor visits, patients may have 1 support person aged 25 or older with them. For treatment visits, patients cannot have anyone with them due to current Covid guidelines and our immunocompromised population.   Fosaprepitant injection What is this medicine? FOSAPREPITANT (fos ap RE pi tant) is used together with other medicines to prevent nausea and vomiting caused by cancer treatment (chemotherapy). This medicine may be used for other purposes; ask your health care  provider or pharmacist if you have questions. COMMON BRAND NAME(S): Emend What should I tell my health care provider before I take this medicine? They need to know if you have any of these conditions:  liver disease  an unusual or allergic reaction to fosaprepitant, aprepitant, medicines, foods, dyes, or  preservatives  pregnant or trying to get pregnant  breast-feeding How should I use this medicine? This medicine is for injection into a vein. It is given by a health care professional in a hospital or clinic setting. Talk to your pediatrician regarding the use of this medicine in children. While this drug may be prescribed for children as young as 6 months for selected conditions, precautions do apply. Overdosage: If you think you have taken too much of this medicine contact a poison control center or emergency room at once. NOTE: This medicine is only for you. Do not share this medicine with others. What if I miss a dose? This does not apply. What may interact with this medicine? Do not take this medicine with any of these medicines:  cisapride  flibanserin  lomitapide  pimozide This medicine may also interact with the following medications:  diltiazem  female hormones, like estrogens or progestins and birth control pills  medicines for fungal infections like ketoconazole and itraconazole  medicines for HIV  medicines for seizures or to control epilepsy like carbamazepine or phenytoin  medicines used for sleep or anxiety disorders like alprazolam, diazepam, or midazolam  nefazodone  paroxetine  ranolazine  rifampin  some chemotherapy medications like etoposide, ifosfamide, vinblastine, vincristine  some antibiotics like clarithromycin, erythromycin, troleandomycin  steroid medicines like dexamethasone or methylprednisolone  tolbutamide  warfarin This list may not describe all possible interactions. Give your health care provider a list of all the medicines, herbs, non-prescription drugs, or dietary supplements you use. Also tell them if you smoke, drink alcohol, or use illegal drugs. Some items may interact with your medicine. What should I watch for while using this medicine? Do not take this medicine if you already have nausea and vomiting. Ask your health  care provider what to do if you already have nausea. Birth control pills and other methods of hormonal contraception (for example, IUD or patch) may not work properly while you are taking this medicine. Use an extra method of birth control during treatment and for 1 month after your last dose of fosaprepitant. This medicine should not be used continuously for a long time. Visit your doctor or health care professional for regular check-ups. This medicine may change your liver function blood test results. What side effects may I notice from receiving this medicine? Side effects that you should report to your doctor or health care professional as soon as possible:  allergic reactions like skin rash, itching or hives, swelling of the face, lips, or tongue  breathing problems  changes in heart rhythm  high or low blood pressure  pain, redness, or irritation at site where injected  rectal bleeding  serious dizziness or disorientation, confusion  sharp or severe stomach pain  sharp pain in your leg Side effects that usually do not require medical attention (report to your doctor or health care professional if they continue or are bothersome):  constipation or diarrhea  hair loss  headache  hiccups  loss of appetite  nausea  upset stomach  tiredness This list may not describe all possible side effects. Call your doctor for medical advice about side effects. You may report side effects to FDA at 1-800-FDA-1088.  Where should I keep my medicine? This drug is given in a hospital or clinic and will not be stored at home. NOTE: This sheet is a summary. It may not cover all possible information. If you have questions about this medicine, talk to your doctor, pharmacist, or health care provider.  2021 Elsevier/Gold Standard (2017-01-08 12:55:48)  Dexamethasone injection What is this medicine? DEXAMETHASONE (dex a METH a sone) is a corticosteroid. It is used to treat inflammation of  the skin, joints, lungs, and other organs. Common conditions treated include asthma, allergies, and arthritis. It is also used for other conditions, like blood disorders and diseases of the adrenal glands. This medicine may be used for other purposes; ask your health care provider or pharmacist if you have questions. COMMON BRAND NAME(S): Decadron, DoubleDex, ReadySharp Dexamethasone, Simplist Dexamethasone, Solurex What should I tell my health care provider before I take this medicine? They need to know if you have any of these conditions:  Cushing's syndrome  diabetes  glaucoma  heart disease  high blood pressure  infection like herpes, measles, tuberculosis, or chickenpox  kidney disease  liver disease  mental illness  myasthenia gravis  osteoporosis  previous heart attack  seizures  stomach or intestine problems  thyroid disease  an unusual or allergic reaction to dexamethasone, corticosteroids, other medicines, lactose, foods, dyes, or preservatives  pregnant or trying to get pregnant  breast-feeding How should I use this medicine? This medicine is for injection into a muscle, joint, lesion, soft tissue, or vein. It is given by a health care professional in a hospital or clinic setting. Talk to your pediatrician regarding the use of this medicine in children. Special care may be needed. Overdosage: If you think you have taken too much of this medicine contact a poison control center or emergency room at once. NOTE: This medicine is only for you. Do not share this medicine with others. What if I miss a dose? This may not apply. If you are having a series of injections over a prolonged period, try not to miss an appointment. Call your doctor or health care professional to reschedule if you are unable to keep an appointment. What may interact with this medicine? Do not take this medicine with any of the following medications:  live virus vaccines This medicine  may also interact with the following medications:  aminoglutethimide  amphotericin B  aspirin and aspirin-like medicines  certain antibiotics like erythromycin, clarithromycin, and troleandomycin  certain antivirals for HIV or hepatitis  certain medicines for seizures like carbamazepine, phenobarbital, phenytoin  certain medicines to treat myasthenia gravis  cholestyramine  cyclosporine  digoxin  diuretics  ephedrine  female hormones, like estrogen or progestins and birth control pills  insulin or other medicines for diabetes  isoniazid  ketoconazole  medicines that relax muscles for surgery  mifepristone  NSAIDs, medicines for pain and inflammation, like ibuprofen or naproxen  rifampin  skin tests for allergies  thalidomide  vaccines  warfarin This list may not describe all possible interactions. Give your health care provider a list of all the medicines, herbs, non-prescription drugs, or dietary supplements you use. Also tell them if you smoke, drink alcohol, or use illegal drugs. Some items may interact with your medicine. What should I watch for while using this medicine? Visit your health care professional for regular checks on your progress. Tell your health care professional if your symptoms do not start to get better or if they get worse. Your condition will be monitored carefully  while you are receiving this medicine. Wear a medical ID bracelet or chain. Carry a card that describes your disease and details of your medicine and dosage times. This medicine may increase your risk of getting an infection. Call your health care professional for advice if you get a fever, chills, or sore throat, or other symptoms of a cold or flu. Do not treat yourself. Try to avoid being around people who are sick. Call your health care professional if you are around anyone with measles, chickenpox, or if you develop sores or blisters that do not heal properly. If you are  going to need surgery or other procedures, tell your doctor or health care professional that you have taken this medicine within the last 12 months. Ask your doctor or health care professional about your diet. You may need to lower the amount of salt you eat. This medicine may increase blood sugar. Ask your healthcare provider if changes in diet or medicines are needed if you have diabetes. What side effects may I notice from receiving this medicine? Side effects that you should report to your doctor or health care professional as soon as possible:  allergic reactions like skin rash, itching or hives, swelling of the face, lips, or tongue  bloody or black, tarry stools  changes in emotions or moods  changes in vision  confusion, excitement, restlessness  depressed mood  eye pain  hallucinations  muscle weakness  severe or sudden stomach or belly pain  signs and symptoms of high blood sugar such as being more thirsty or hungry or having to urinate more than normal. You may also feel very tired or have blurry vision.  signs and symptoms of infection like fever; chills; cough; sore throat; pain or trouble passing urine  swelling of ankles, feet  unusual bruising or bleeding  wounds that do not heal Side effects that usually do not require medical attention (report to your doctor or health care professional if they continue or are bothersome):  increased appetite  increased growth of face or body hair  headache  nausea, vomiting  pain, redness, or irritation at site where injected  skin problems, acne, thin and shiny skin  trouble sleeping  weight gain This list may not describe all possible side effects. Call your doctor for medical advice about side effects. You may report side effects to FDA at 1-800-FDA-1088. Where should I keep my medicine? This medicine is given in a hospital or clinic and will not be stored at home. NOTE: This sheet is a summary. It may not  cover all possible information. If you have questions about this medicine, talk to your doctor, pharmacist, or health care provider.  2021 Elsevier/Gold Standard (2019-04-05 13:51:58)  Palonosetron Injection What is this medicine? PALONOSETRON (pal oh NOE se tron) is used to prevent nausea and vomiting caused by chemotherapy. It also helps prevent delayed nausea and vomiting that may occur a few days after your treatment. This medicine may be used for other purposes; ask your health care provider or pharmacist if you have questions. COMMON BRAND NAME(S): Aloxi What should I tell my health care provider before I take this medicine? They need to know if you have any of these conditions:  an unusual or allergic reaction to palonosetron, dolasetron, granisetron, ondansetron, other medicines, foods, dyes, or preservatives  pregnant or trying to get pregnant  breast-feeding How should I use this medicine? This medicine is for infusion into a vein. It is given by a health care  professional in a hospital or clinic setting. Talk to your pediatrician regarding the use of this medicine in children. While this drug may be prescribed for children as young as 1 month for selected conditions, precautions do apply. Overdosage: If you think you have taken too much of this medicine contact a poison control center or emergency room at once. NOTE: This medicine is only for you. Do not share this medicine with others. What if I miss a dose? This does not apply. What may interact with this medicine?  certain medicines for depression, anxiety, or psychotic disturbances  fentanyl  linezolid  MAOIs like Carbex, Eldepryl, Marplan, Nardil, and Parnate  methylene blue (injected into a vein)  tramadol This list may not describe all possible interactions. Give your health care provider a list of all the medicines, herbs, non-prescription drugs, or dietary supplements you use. Also tell them if you smoke,  drink alcohol, or use illegal drugs. Some items may interact with your medicine. What should I watch for while using this medicine? Your condition will be monitored carefully while you are receiving this medicine. What side effects may I notice from receiving this medicine? Side effects that you should report to your doctor or health care professional as soon as possible:  allergic reactions like skin rash, itching or hives, swelling of the face, lips, or tongue  breathing problems  confusion  dizziness  fast, irregular heartbeat  fever and chills  loss of balance or coordination  seizures  sweating  swelling of the hands and feet  tremors  unusually weak or tired Side effects that usually do not require medical attention (report to your doctor or health care professional if they continue or are bothersome):  constipation or diarrhea  headache This list may not describe all possible side effects. Call your doctor for medical advice about side effects. You may report side effects to FDA at 1-800-FDA-1088. Where should I keep my medicine? This drug is given in a hospital or clinic and will not be stored at home. NOTE: This sheet is a summary. It may not cover all possible information. If you have questions about this medicine, talk to your doctor, pharmacist, or health care provider.  2021 Elsevier/Gold Standard (2013-07-29 10:38:36)  Carboplatin injection What is this medicine? CARBOPLATIN (KAR boe pla tin) is a chemotherapy drug. It targets fast dividing cells, like cancer cells, and causes these cells to die. This medicine is used to treat ovarian cancer and many other cancers. This medicine may be used for other purposes; ask your health care provider or pharmacist if you have questions. COMMON BRAND NAME(S): Paraplatin What should I tell my health care provider before I take this medicine? They need to know if you have any of these conditions:  blood  disorders  hearing problems  kidney disease  recent or ongoing radiation therapy  an unusual or allergic reaction to carboplatin, cisplatin, other chemotherapy, other medicines, foods, dyes, or preservatives  pregnant or trying to get pregnant  breast-feeding How should I use this medicine? This drug is usually given as an infusion into a vein. It is administered in a hospital or clinic by a specially trained health care professional. Talk to your pediatrician regarding the use of this medicine in children. Special care may be needed. Overdosage: If you think you have taken too much of this medicine contact a poison control center or emergency room at once. NOTE: This medicine is only for you. Do not share this medicine with others.  What if I miss a dose? It is important not to miss a dose. Call your doctor or health care professional if you are unable to keep an appointment. What may interact with this medicine?  medicines for seizures  medicines to increase blood counts like filgrastim, pegfilgrastim, sargramostim  some antibiotics like amikacin, gentamicin, neomycin, streptomycin, tobramycin  vaccines Talk to your doctor or health care professional before taking any of these medicines:  acetaminophen  aspirin  ibuprofen  ketoprofen  naproxen This list may not describe all possible interactions. Give your health care provider a list of all the medicines, herbs, non-prescription drugs, or dietary supplements you use. Also tell them if you smoke, drink alcohol, or use illegal drugs. Some items may interact with your medicine. What should I watch for while using this medicine? Your condition will be monitored carefully while you are receiving this medicine. You will need important blood work done while you are taking this medicine. This drug may make you feel generally unwell. This is not uncommon, as chemotherapy can affect healthy cells as well as cancer cells. Report any  side effects. Continue your course of treatment even though you feel ill unless your doctor tells you to stop. In some cases, you may be given additional medicines to help with side effects. Follow all directions for their use. Call your doctor or health care professional for advice if you get a fever, chills or sore throat, or other symptoms of a cold or flu. Do not treat yourself. This drug decreases your body's ability to fight infections. Try to avoid being around people who are sick. This medicine may increase your risk to bruise or bleed. Call your doctor or health care professional if you notice any unusual bleeding. Be careful brushing and flossing your teeth or using a toothpick because you may get an infection or bleed more easily. If you have any dental work done, tell your dentist you are receiving this medicine. Avoid taking products that contain aspirin, acetaminophen, ibuprofen, naproxen, or ketoprofen unless instructed by your doctor. These medicines may hide a fever. Do not become pregnant while taking this medicine. Women should inform their doctor if they wish to become pregnant or think they might be pregnant. There is a potential for serious side effects to an unborn child. Talk to your health care professional or pharmacist for more information. Do not breast-feed an infant while taking this medicine. What side effects may I notice from receiving this medicine? Side effects that you should report to your doctor or health care professional as soon as possible:  allergic reactions like skin rash, itching or hives, swelling of the face, lips, or tongue  signs of infection - fever or chills, cough, sore throat, pain or difficulty passing urine  signs of decreased platelets or bleeding - bruising, pinpoint red spots on the skin, black, tarry stools, nosebleeds  signs of decreased red blood cells - unusually weak or tired, fainting spells, lightheadedness  breathing  problems  changes in hearing  changes in vision  chest pain  high blood pressure  low blood counts - This drug may decrease the number of [place] blood cells, red blood cells and platelets. You may be at increased risk for infections and bleeding.  nausea and vomiting  pain, swelling, redness or irritation at the injection site  pain, tingling, numbness in the hands or feet  problems with balance, talking, walking  trouble passing urine or change in the amount of urine Side effects that  usually do not require medical attention (report to your doctor or health care professional if they continue or are bothersome):  hair loss  loss of appetite  metallic taste in the mouth or changes in taste This list may not describe all possible side effects. Call your doctor for medical advice about side effects. You may report side effects to FDA at 1-800-FDA-1088. Where should I keep my medicine? This drug is given in a hospital or clinic and will not be stored at home. NOTE: This sheet is a summary. It may not cover all possible information. If you have questions about this medicine, talk to your doctor, pharmacist, or health care provider.  2021 Elsevier/Gold Standard (2007-12-28 14:38:05)  Pembrolizumab injection What is this medicine? PEMBROLIZUMAB (pem broe liz ue mab) is a monoclonal antibody. It is used to treat certain types of cancer. This medicine may be used for other purposes; ask your health care provider or pharmacist if you have questions. COMMON BRAND NAME(S): Keytruda What should I tell my health care provider before I take this medicine? They need to know if you have any of these conditions:  autoimmune diseases like Crohn's disease, ulcerative colitis, or lupus  have had or planning to have an allogeneic stem cell transplant (uses someone else's stem cells)  history of organ transplant  history of chest radiation  nervous system problems like myasthenia gravis or  Guillain-Barre syndrome  an unusual or allergic reaction to pembrolizumab, other medicines, foods, dyes, or preservatives  pregnant or trying to get pregnant  breast-feeding How should I use this medicine? This medicine is for infusion into a vein. It is given by a health care professional in a hospital or clinic setting. A special MedGuide will be given to you before each treatment. Be sure to read this information carefully each time. Talk to your pediatrician regarding the use of this medicine in children. While this drug may be prescribed for children as young as 6 months for selected conditions, precautions do apply. Overdosage: If you think you have taken too much of this medicine contact a poison control center or emergency room at once. NOTE: This medicine is only for you. Do not share this medicine with others. What if I miss a dose? It is important not to miss your dose. Call your doctor or health care professional if you are unable to keep an appointment. What may interact with this medicine? Interactions have not been studied. This list may not describe all possible interactions. Give your health care provider a list of all the medicines, herbs, non-prescription drugs, or dietary supplements you use. Also tell them if you smoke, drink alcohol, or use illegal drugs. Some items may interact with your medicine. What should I watch for while using this medicine? Your condition will be monitored carefully while you are receiving this medicine. You may need blood work done while you are taking this medicine. Do not become pregnant while taking this medicine or for 4 months after stopping it. Women should inform their doctor if they wish to become pregnant or think they might be pregnant. There is a potential for serious side effects to an unborn child. Talk to your health care professional or pharmacist for more information. Do not breast-feed an infant while taking this medicine or for 4  months after the last dose. What side effects may I notice from receiving this medicine? Side effects that you should report to your doctor or health care professional as soon as possible:  allergic reactions like skin rash, itching or hives, swelling of the face, lips, or tongue  bloody or black, tarry  breathing problems  changes in vision  chest pain  chills  confusion  constipation  cough  diarrhea  dizziness or feeling faint or lightheaded  fast or irregular heartbeat  fever  flushing  joint pain  low blood counts - this medicine may decrease the number of Celia Friedland blood cells, red blood cells and platelets. You may be at increased risk for infections and bleeding.  muscle pain  muscle weakness  pain, tingling, numbness in the hands or feet  persistent headache  redness, blistering, peeling or loosening of the skin, including inside the mouth  signs and symptoms of high blood sugar such as dizziness; dry mouth; dry skin; fruity breath; nausea; stomach pain; increased hunger or thirst; increased urination  signs and symptoms of kidney injury like trouble passing urine or change in the amount of urine  signs and symptoms of liver injury like dark urine, light-colored stools, loss of appetite, nausea, right upper belly pain, yellowing of the eyes or skin  sweating  swollen lymph nodes  weight loss Side effects that usually do not require medical attention (report to your doctor or health care professional if they continue or are bothersome):  decreased appetite  hair loss  tiredness This list may not describe all possible side effects. Call your doctor for medical advice about side effects. You may report side effects to FDA at 1-800-FDA-1088. Where should I keep my medicine? This drug is given in a hospital or clinic and will not be stored at home. NOTE: This sheet is a summary. It may not cover all possible information. If you have questions about  this medicine, talk to your doctor, pharmacist, or health care provider.  2021 [employer]vier/[employer] Standard (2019-08-24 21:44:53)

## 2021-03-11 NOTE — Assessment & Plan Note (Addendum)
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  CEA elevated/CA 19-9 elevated [>5800]; s/p carbo-Keytruda #3- May 16th, 2022-interval decrease in size of the mediastinal lymph nodes; slight decrease in the liver mass; groundglass opacity/left lower lobe [? Aspiration-see below].  # Chemo today- carboplatin-Keytruda #. 4. Labs today reviewed;  Post 4 cycles recommend-Keytruda maintenance.  #Anemia hemoglobin 9.5- secondary carboplatin; s/p 1 unit PRBC transfusion- monitor closely.   # COPD/wheezing;  continue inhalers/defer to Dr. Raul Del regarding management.  Question aspiration-reviewed modified barium swallow-negative for aspiration.  Discussed with speech pathology.  # port malfunction: Stable  #Poor appetite- weight loss-second underlying malignancy: Recommend Megace.  Discussed regarding risk of blood clots.; discussed re: aspiration precaution.  # Hand tremors with movement-: TSH-normal.; HOLD neurology evaluation.   # DISPOSITION:  # proceed with Keytruda-carbo today;  # in 10 days- labs- cbc/bmp;HOLd tube today.; possible 1 unit PRBC # follow up in 3 weeks- MD: labs- cbc/cmp/ca-19-9; CEA- carbo-keytruda-Dr. .B

## 2021-03-11 NOTE — Progress Notes (Signed)
Pt received keytruda/carboplatin infusion in clinic today.Tolerated well. No complaints at d/c.

## 2021-03-11 NOTE — Progress Notes (Signed)
Nutrition Follow-up:  Patient with primary lung vs cholangio/pancreaticobilliary vs cervix.  Patient receiving chemotherapy.    Met with patient during infusion.  Patient reports that she is drinking 1 [employer] 1.4 shake per day, mixing chocolate syrup in it.  Thinks megace has helped a little, feels like she is snacking more.  Eating trail mix during visit from cancer center snacks.  Reports that she ate a rice krispie treat yesterday, drank a shake and ate some raw vegetables and dip.    Notes from SLP reviewed.     Medications: megace  Labs: reviewed  Anthropometrics:   Weight 125 lb 3.2 oz today, stable  125 lb on 5/23 133 lb on 4/22 UBW of 135-140 lb   NUTRITION DIAGNOSIS: Inadequate oral intake continues   INTERVENTION:  Encouraged patient to snack on trail mix as seems to be enjoying it during infusion. Discussed other high calorie, high protein snacks. Encouraged patient to increase [employer] 1.4 shakes if at all possible for more calories and protein.  Can drink 4 [employer] 1.4 shakes per day to better meet nutritional needs as solid food intake decreased.     MONITORING, EVALUATION, GOAL: weight trends, intake   NEXT VISIT: Monday, June 27 during infusion  Jaasiel Hollyfield B. Zenia Resides, [place], [place] Registered Dietitian [phone] (mobile)

## 2021-03-12 ENCOUNTER — Inpatient Hospital Stay: Payer: Medicare Other

## 2021-03-12 ENCOUNTER — Encounter: Payer: Self-pay | Admitting: Internal Medicine

## 2021-03-12 DIAGNOSIS — Z5112 Encounter for antineoplastic immunotherapy: Secondary | ICD-10-CM | POA: Diagnosis not present

## 2021-03-12 DIAGNOSIS — C3432 Malignant neoplasm of lower lobe, left bronchus or lung: Secondary | ICD-10-CM

## 2021-03-12 LAB — CANCER ANTIGEN 19-9: CA 19-9: 632 U/mL — ABNORMAL HIGH (ref 0–35)

## 2021-03-12 LAB — CEA: CEA: 6587 ng/mL — ABNORMAL HIGH (ref 0.0–4.7)

## 2021-03-12 MED ORDER — PEGFILGRASTIM-JMDB 6 MG/0.6ML ~~LOC~~ SOSY
6.0000 mg | PREFILLED_SYRINGE | Freq: Once | SUBCUTANEOUS | Status: AC
  Administered 2021-03-12: 6 mg via SUBCUTANEOUS
  Filled 2021-03-12: qty 0.6

## 2021-03-13 ENCOUNTER — Encounter: Payer: Self-pay | Admitting: Internal Medicine

## 2021-03-16 ENCOUNTER — Emergency Department: Payer: Medicare Other

## 2021-03-16 ENCOUNTER — Encounter: Payer: Self-pay | Admitting: Emergency Medicine

## 2021-03-16 ENCOUNTER — Other Ambulatory Visit: Payer: Self-pay

## 2021-03-16 ENCOUNTER — Inpatient Hospital Stay
Admission: EM | Admit: 2021-03-16 | Discharge: 2021-04-05 | DRG: 870 | Disposition: E | Payer: Medicare Other | Attending: Pulmonary Disease | Admitting: Pulmonary Disease

## 2021-03-16 DIAGNOSIS — U071 COVID-19: Secondary | ICD-10-CM

## 2021-03-16 DIAGNOSIS — J440 Chronic obstructive pulmonary disease with acute lower respiratory infection: Secondary | ICD-10-CM | POA: Diagnosis present

## 2021-03-16 DIAGNOSIS — J1282 Pneumonia due to coronavirus disease 2019: Secondary | ICD-10-CM | POA: Diagnosis present

## 2021-03-16 DIAGNOSIS — J441 Chronic obstructive pulmonary disease with (acute) exacerbation: Secondary | ICD-10-CM | POA: Diagnosis present

## 2021-03-16 DIAGNOSIS — Z515 Encounter for palliative care: Secondary | ICD-10-CM

## 2021-03-16 DIAGNOSIS — E876 Hypokalemia: Secondary | ICD-10-CM | POA: Diagnosis present

## 2021-03-16 DIAGNOSIS — A4189 Other specified sepsis: Principal | ICD-10-CM | POA: Diagnosis present

## 2021-03-16 DIAGNOSIS — Z90710 Acquired absence of both cervix and uterus: Secondary | ICD-10-CM

## 2021-03-16 DIAGNOSIS — Y842 Radiological procedure and radiotherapy as the cause of abnormal reaction of the patient, or of later complication, without mention of misadventure at the time of the procedure: Secondary | ICD-10-CM | POA: Diagnosis present

## 2021-03-16 DIAGNOSIS — R0602 Shortness of breath: Secondary | ICD-10-CM

## 2021-03-16 DIAGNOSIS — N179 Acute kidney failure, unspecified: Secondary | ICD-10-CM | POA: Diagnosis present

## 2021-03-16 DIAGNOSIS — C787 Secondary malignant neoplasm of liver and intrahepatic bile duct: Secondary | ICD-10-CM | POA: Diagnosis present

## 2021-03-16 DIAGNOSIS — I700 Atherosclerosis of aorta: Secondary | ICD-10-CM | POA: Diagnosis present

## 2021-03-16 DIAGNOSIS — D61810 Antineoplastic chemotherapy induced pancytopenia: Secondary | ICD-10-CM | POA: Diagnosis present

## 2021-03-16 DIAGNOSIS — E785 Hyperlipidemia, unspecified: Secondary | ICD-10-CM | POA: Diagnosis present

## 2021-03-16 DIAGNOSIS — Z4659 Encounter for fitting and adjustment of other gastrointestinal appliance and device: Secondary | ICD-10-CM

## 2021-03-16 DIAGNOSIS — Z978 Presence of other specified devices: Secondary | ICD-10-CM

## 2021-03-16 DIAGNOSIS — T451X5A Adverse effect of antineoplastic and immunosuppressive drugs, initial encounter: Secondary | ICD-10-CM | POA: Diagnosis present

## 2021-03-16 DIAGNOSIS — J9600 Acute respiratory failure, unspecified whether with hypoxia or hypercapnia: Secondary | ICD-10-CM | POA: Diagnosis not present

## 2021-03-16 DIAGNOSIS — D6959 Other secondary thrombocytopenia: Secondary | ICD-10-CM | POA: Diagnosis present

## 2021-03-16 DIAGNOSIS — Z882 Allergy status to sulfonamides status: Secondary | ICD-10-CM

## 2021-03-16 DIAGNOSIS — C3432 Malignant neoplasm of lower lobe, left bronchus or lung: Secondary | ICD-10-CM | POA: Diagnosis present

## 2021-03-16 DIAGNOSIS — D8481 Immunodeficiency due to conditions classified elsewhere: Secondary | ICD-10-CM | POA: Diagnosis present

## 2021-03-16 DIAGNOSIS — E1122 Type 2 diabetes mellitus with diabetic chronic kidney disease: Secondary | ICD-10-CM | POA: Diagnosis present

## 2021-03-16 DIAGNOSIS — Z66 Do not resuscitate: Secondary | ICD-10-CM | POA: Diagnosis not present

## 2021-03-16 DIAGNOSIS — Z880 Allergy status to penicillin: Secondary | ICD-10-CM

## 2021-03-16 DIAGNOSIS — J80 Acute respiratory distress syndrome: Secondary | ICD-10-CM | POA: Diagnosis present

## 2021-03-16 DIAGNOSIS — K219 Gastro-esophageal reflux disease without esophagitis: Secondary | ICD-10-CM | POA: Diagnosis present

## 2021-03-16 DIAGNOSIS — A419 Sepsis, unspecified organism: Secondary | ICD-10-CM | POA: Diagnosis present

## 2021-03-16 DIAGNOSIS — Z79899 Other long term (current) drug therapy: Secondary | ICD-10-CM

## 2021-03-16 DIAGNOSIS — N183 Chronic kidney disease, stage 3 unspecified: Secondary | ICD-10-CM | POA: Diagnosis present

## 2021-03-16 DIAGNOSIS — I10 Essential (primary) hypertension: Secondary | ICD-10-CM | POA: Diagnosis present

## 2021-03-16 DIAGNOSIS — F17210 Nicotine dependence, cigarettes, uncomplicated: Secondary | ICD-10-CM | POA: Diagnosis present

## 2021-03-16 DIAGNOSIS — I82409 Acute embolism and thrombosis of unspecified deep veins of unspecified lower extremity: Secondary | ICD-10-CM

## 2021-03-16 DIAGNOSIS — Z794 Long term (current) use of insulin: Secondary | ICD-10-CM

## 2021-03-16 DIAGNOSIS — I2510 Atherosclerotic heart disease of native coronary artery without angina pectoris: Secondary | ICD-10-CM | POA: Diagnosis present

## 2021-03-16 DIAGNOSIS — R451 Restlessness and agitation: Secondary | ICD-10-CM | POA: Diagnosis not present

## 2021-03-16 DIAGNOSIS — I129 Hypertensive chronic kidney disease with stage 1 through stage 4 chronic kidney disease, or unspecified chronic kidney disease: Secondary | ICD-10-CM | POA: Diagnosis present

## 2021-03-16 DIAGNOSIS — N1831 Chronic kidney disease, stage 3a: Secondary | ICD-10-CM | POA: Diagnosis present

## 2021-03-16 DIAGNOSIS — R6520 Severe sepsis without septic shock: Secondary | ICD-10-CM | POA: Diagnosis not present

## 2021-03-16 DIAGNOSIS — Z01818 Encounter for other preprocedural examination: Secondary | ICD-10-CM

## 2021-03-16 DIAGNOSIS — J9601 Acute respiratory failure with hypoxia: Secondary | ICD-10-CM

## 2021-03-16 DIAGNOSIS — Z8719 Personal history of other diseases of the digestive system: Secondary | ICD-10-CM

## 2021-03-16 DIAGNOSIS — E1165 Type 2 diabetes mellitus with hyperglycemia: Secondary | ICD-10-CM | POA: Diagnosis present

## 2021-03-16 DIAGNOSIS — Z8711 Personal history of peptic ulcer disease: Secondary | ICD-10-CM

## 2021-03-16 DIAGNOSIS — Z888 Allergy status to other drugs, medicaments and biological substances status: Secondary | ICD-10-CM

## 2021-03-16 DIAGNOSIS — R6521 Severe sepsis with septic shock: Secondary | ICD-10-CM | POA: Diagnosis present

## 2021-03-16 LAB — COMPREHENSIVE METABOLIC PANEL
ALT: 16 U/L (ref 0–44)
AST: 39 U/L (ref 15–41)
Albumin: 2.3 g/dL — ABNORMAL LOW (ref 3.5–5.0)
Alkaline Phosphatase: 113 U/L (ref 38–126)
Anion gap: 14 (ref 5–15)
BUN: 29 mg/dL — ABNORMAL HIGH (ref 8–23)
CO2: 19 mmol/L — ABNORMAL LOW (ref 22–32)
Calcium: 8.4 mg/dL — ABNORMAL LOW (ref 8.9–10.3)
Chloride: 101 mmol/L (ref 98–111)
Creatinine, Ser: 1.5 mg/dL — ABNORMAL HIGH (ref 0.44–1.00)
GFR, Estimated: 36 mL/min — ABNORMAL LOW (ref >60)
Glucose, Bld: 293 mg/dL — ABNORMAL HIGH (ref 70–99)
Potassium: 4.5 mmol/L (ref 3.5–5.1)
Sodium: 134 mmol/L — ABNORMAL LOW (ref 135–145)
Total Bilirubin: 0.9 mg/dL (ref 0.3–1.2)
Total Protein: 8.3 g/dL — ABNORMAL HIGH (ref 6.5–8.1)

## 2021-03-16 LAB — CBC WITH DIFFERENTIAL/PLATELET
Abs Immature Granulocytes: 1.25 10*3/uL — ABNORMAL HIGH (ref 0.00–0.07)
Basophils Absolute: 0 10*3/uL (ref 0.0–0.1)
Basophils Relative: 0 %
Eosinophils Absolute: 0 10*3/uL (ref 0.0–0.5)
Eosinophils Relative: 0 %
HCT: 30.6 % — ABNORMAL LOW (ref 36.0–46.0)
Hemoglobin: 9.8 g/dL — ABNORMAL LOW (ref 12.0–15.0)
Immature Granulocytes: 10 %
Lymphocytes Relative: 4 %
Lymphs Abs: 0.5 10*3/uL — ABNORMAL LOW (ref 0.7–4.0)
MCH: 32.9 pg (ref 26.0–34.0)
MCHC: 32 g/dL (ref 30.0–36.0)
MCV: 102.7 fL — ABNORMAL HIGH (ref 80.0–100.0)
Monocytes Absolute: 0.7 10*3/uL (ref 0.1–1.0)
Monocytes Relative: 5 %
Neutro Abs: 10.3 10*3/uL — ABNORMAL HIGH (ref 1.7–7.7)
Neutrophils Relative %: 81 %
Platelets: 222 10*3/uL (ref 150–400)
RBC: 2.98 MIL/uL — ABNORMAL LOW (ref 3.87–5.11)
RDW: 18.2 % — ABNORMAL HIGH (ref 11.5–15.5)
Smear Review: NORMAL
WBC: 12.8 10*3/uL — ABNORMAL HIGH (ref 4.0–10.5)
nRBC: 0 % (ref 0.0–0.2)

## 2021-03-16 LAB — RESP PANEL BY RT-PCR (FLU A&B, COVID) ARPGX2
Influenza A by PCR: NEGATIVE
Influenza B by PCR: NEGATIVE
SARS Coronavirus 2 by RT PCR: POSITIVE — AB

## 2021-03-16 LAB — LACTIC ACID, PLASMA
Lactic Acid, Venous: 4.8 mmol/L (ref 0.5–1.9)
Lactic Acid, Venous: 1.4 mmol/L (ref 0.5–1.9)

## 2021-03-16 LAB — D-DIMER, QUANTITATIVE: D-Dimer, Quant: 3.42 ug/mL-FEU — ABNORMAL HIGH (ref 0.00–0.50)

## 2021-03-16 LAB — TROPONIN I (HIGH SENSITIVITY)
Troponin I (High Sensitivity): 43 ng/L — ABNORMAL HIGH (ref <18)
Troponin I (High Sensitivity): 18 ng/L — ABNORMAL HIGH (ref <18)

## 2021-03-16 LAB — PROTIME-INR
INR: 1.2 (ref 0.8–1.2)
Prothrombin Time: 15.5 seconds — ABNORMAL HIGH (ref 11.4–15.2)

## 2021-03-16 LAB — CBG MONITORING, ED
Glucose-Capillary: 302 mg/dL — ABNORMAL HIGH (ref 70–99)
Glucose-Capillary: 242 mg/dL — ABNORMAL HIGH (ref 70–99)

## 2021-03-16 LAB — BRAIN NATRIURETIC PEPTIDE: B Natriuretic Peptide: 426.7 pg/mL — ABNORMAL HIGH (ref 0.0–100.0)

## 2021-03-16 LAB — PROCALCITONIN: Procalcitonin: 0.56 ng/mL

## 2021-03-16 MED ORDER — ACETAMINOPHEN 325 MG PO TABS
650.0000 mg | ORAL_TABLET | Freq: Four times a day (QID) | ORAL | Status: DC | PRN
  Administered 2021-03-18 (×2): 650 mg via ORAL
  Filled 2021-03-16 (×3): qty 2

## 2021-03-16 MED ORDER — SODIUM CHLORIDE 0.9 % IV BOLUS
1000.0000 mL | Freq: Once | INTRAVENOUS | Status: AC
  Administered 2021-03-16: 1000 mL via INTRAVENOUS

## 2021-03-16 MED ORDER — PREDNISONE 20 MG PO TABS: 50.0000 mg | ORAL_TABLET | Freq: Every day | ORAL | Status: DC

## 2021-03-16 MED ORDER — ONDANSETRON HCL 4 MG PO TABS: 4.0000 mg | ORAL_TABLET | Freq: Four times a day (QID) | ORAL | Status: DC | PRN

## 2021-03-16 MED ORDER — SODIUM CHLORIDE 0.9 % IV SOLN
100.0000 mg | Freq: Every day | INTRAVENOUS | Status: AC
  Administered 2021-03-17 – 2021-03-20 (×4): 100 mg via INTRAVENOUS
  Filled 2021-03-16: qty 100
  Filled 2021-03-16 (×4): qty 20

## 2021-03-16 MED ORDER — VANCOMYCIN HCL IN DEXTROSE 1-5 GM/200ML-% IV SOLN
1000.0000 mg | Freq: Once | INTRAVENOUS | Status: AC
  Administered 2021-03-16: 1000 mg via INTRAVENOUS
  Filled 2021-03-16: qty 200

## 2021-03-16 MED ORDER — ALBUTEROL SULFATE (2.5 MG/3ML) 0.083% IN NEBU: 2.5000 mg | INHALATION_SOLUTION | Freq: Four times a day (QID) | RESPIRATORY_TRACT | Status: DC | PRN

## 2021-03-16 MED ORDER — FOLIC ACID 1 MG PO TABS
1.0000 mg | ORAL_TABLET | Freq: Every day | ORAL | Status: DC
  Administered 2021-03-16 – 2021-03-19 (×4): 1 mg via ORAL
  Filled 2021-03-16 (×4): qty 1

## 2021-03-16 MED ORDER — METRONIDAZOLE 500 MG/100ML IV SOLN
500.0000 mg | Freq: Once | INTRAVENOUS | Status: AC
  Administered 2021-03-16: 500 mg via INTRAVENOUS
  Filled 2021-03-16: qty 100

## 2021-03-16 MED ORDER — SODIUM CHLORIDE 0.9 % IV SOLN
2.0000 g | INTRAVENOUS | Status: AC
  Administered 2021-03-16 – 2021-03-18 (×3): 2 g via INTRAVENOUS
  Filled 2021-03-16 (×3): qty 20

## 2021-03-16 MED ORDER — INSULIN ASPART 100 UNIT/ML IJ SOLN
0.0000 [IU] | Freq: Three times a day (TID) | INTRAMUSCULAR | Status: DC
  Administered 2021-03-16: 7 [IU] via SUBCUTANEOUS
  Administered 2021-03-17: 3 [IU] via SUBCUTANEOUS
  Administered 2021-03-17 – 2021-03-18 (×2): 7 [IU] via SUBCUTANEOUS
  Administered 2021-03-18 (×2): 5 [IU] via SUBCUTANEOUS
  Filled 2021-03-16 (×6): qty 1

## 2021-03-16 MED ORDER — SODIUM CHLORIDE 0.9 % IV SOLN
2.0000 g | Freq: Once | INTRAVENOUS | Status: AC
  Administered 2021-03-16: 2 g via INTRAVENOUS
  Filled 2021-03-16: qty 2

## 2021-03-16 MED ORDER — ENOXAPARIN SODIUM 30 MG/0.3ML IJ SOSY
30.0000 mg | PREFILLED_SYRINGE | INTRAMUSCULAR | Status: DC
  Administered 2021-03-16: 30 mg via SUBCUTANEOUS
  Filled 2021-03-16: qty 0.3

## 2021-03-16 MED ORDER — SODIUM CHLORIDE 0.9 % IV BOLUS
250.0000 mL | Freq: Once | INTRAVENOUS | Status: AC
  Administered 2021-03-16: 250 mL via INTRAVENOUS

## 2021-03-16 MED ORDER — ALBUTEROL SULFATE HFA 108 (90 BASE) MCG/ACT IN AERS: 1.0000 | INHALATION_SPRAY | Freq: Four times a day (QID) | RESPIRATORY_TRACT | Status: DC | PRN

## 2021-03-16 MED ORDER — SODIUM CHLORIDE 0.9 % IV SOLN
500.0000 mg | INTRAVENOUS | Status: DC
  Administered 2021-03-16 – 2021-03-17 (×2): 500 mg via INTRAVENOUS
  Filled 2021-03-16 (×3): qty 500

## 2021-03-16 MED ORDER — DEXAMETHASONE SODIUM PHOSPHATE 10 MG/ML IJ SOLN: 6.0000 mg | INTRAMUSCULAR | Status: DC

## 2021-03-16 MED ORDER — SODIUM CHLORIDE 0.9 % IV SOLN
200.0000 mg | Freq: Once | INTRAVENOUS | Status: AC
  Administered 2021-03-16: 200 mg via INTRAVENOUS
  Filled 2021-03-16: qty 200

## 2021-03-16 MED ORDER — MEGESTROL ACETATE 625 MG/5ML PO SUSP: 625.0000 mg | Freq: Every day | ORAL | Status: DC

## 2021-03-16 MED ORDER — SODIUM CHLORIDE 0.9 % IV BOLUS
500.0000 mL | Freq: Once | INTRAVENOUS | Status: AC
  Administered 2021-03-16: 500 mL via INTRAVENOUS

## 2021-03-16 MED ORDER — INSULIN DETEMIR 100 UNIT/ML ~~LOC~~ SOLN
0.0750 [IU]/kg | Freq: Two times a day (BID) | SUBCUTANEOUS | Status: DC
  Administered 2021-03-16 – 2021-03-18 (×5): 4 [IU] via SUBCUTANEOUS
  Filled 2021-03-16 (×6): qty 0.04

## 2021-03-16 MED ORDER — ADULT MULTIVITAMIN W/MINERALS CH
1.0000 | ORAL_TABLET | Freq: Every day | ORAL | Status: DC
  Administered 2021-03-16 – 2021-03-19 (×4): 1 via ORAL
  Filled 2021-03-16 (×4): qty 1

## 2021-03-16 MED ORDER — ALBUTEROL SULFATE HFA 108 (90 BASE) MCG/ACT IN AERS
1.0000 | INHALATION_SPRAY | Freq: Four times a day (QID) | RESPIRATORY_TRACT | Status: DC | PRN
  Administered 2021-03-18 – 2021-03-20 (×2): 2 via RESPIRATORY_TRACT
  Filled 2021-03-16 (×2): qty 6.7

## 2021-03-16 MED ORDER — ONDANSETRON HCL 4 MG/2ML IJ SOLN: 4.0000 mg | Freq: Four times a day (QID) | INTRAMUSCULAR | Status: DC | PRN

## 2021-03-16 MED ORDER — LACTATED RINGERS IV SOLN: INTRAVENOUS | Status: AC

## 2021-03-16 MED ORDER — IOHEXOL 350 MG/ML SOLN
50.0000 mL | Freq: Once | INTRAVENOUS | Status: AC | PRN
  Administered 2021-03-16: 50 mL via INTRAVENOUS

## 2021-03-16 MED ORDER — ACETAMINOPHEN 650 MG RE SUPP: 650.0000 mg | Freq: Four times a day (QID) | RECTAL | Status: DC | PRN

## 2021-03-16 MED ORDER — ROSUVASTATIN CALCIUM 10 MG PO TABS
40.0000 mg | ORAL_TABLET | Freq: Every evening | ORAL | Status: DC
  Administered 2021-03-16 – 2021-03-19 (×4): 40 mg via ORAL
  Filled 2021-03-16: qty 4
  Filled 2021-03-16: qty 2
  Filled 2021-03-16: qty 4
  Filled 2021-03-16: qty 2
  Filled 2021-03-16: qty 4

## 2021-03-16 MED ORDER — HYOSCYAMINE SULFATE ER 0.375 MG PO TB12
0.3750 mg | ORAL_TABLET | Freq: Every day | ORAL | Status: DC | PRN
  Filled 2021-03-16: qty 1

## 2021-03-16 MED ORDER — HYDROCOD POLST-CPM POLST ER 10-8 MG/5ML PO SUER
5.0000 mL | Freq: Every evening | ORAL | Status: DC | PRN
  Administered 2021-03-16 – 2021-03-17 (×3): 5 mL via ORAL
  Filled 2021-03-16 (×3): qty 5

## 2021-03-16 MED ORDER — ASCORBIC ACID 500 MG PO TABS
500.0000 mg | ORAL_TABLET | Freq: Every day | ORAL | Status: DC
  Administered 2021-03-16 – 2021-03-19 (×4): 500 mg via ORAL
  Filled 2021-03-16 (×4): qty 1

## 2021-03-16 MED ORDER — METHYLPREDNISOLONE SODIUM SUCC 125 MG IJ SOLR
60.0000 mg | Freq: Two times a day (BID) | INTRAMUSCULAR | Status: AC
  Administered 2021-03-16 – 2021-03-19 (×6): 60 mg via INTRAVENOUS
  Filled 2021-03-16 (×6): qty 2

## 2021-03-16 MED ORDER — METHYLPREDNISOLONE SODIUM SUCC 125 MG IJ SOLR
125.0000 mg | Freq: Once | INTRAMUSCULAR | Status: AC
  Administered 2021-03-16: 125 mg via INTRAVENOUS
  Filled 2021-03-16: qty 2

## 2021-03-16 MED ORDER — LIDOCAINE-PRILOCAINE 2.5-2.5 % EX CREA
1.0000 "application " | TOPICAL_CREAM | Freq: Once | CUTANEOUS | Status: DC
  Filled 2021-03-16: qty 5

## 2021-03-16 MED ORDER — ZINC SULFATE 220 (50 ZN) MG PO CAPS
220.0000 mg | ORAL_CAPSULE | Freq: Every day | ORAL | Status: DC
  Administered 2021-03-16 – 2021-03-19 (×4): 220 mg via ORAL
  Filled 2021-03-16 (×5): qty 1

## 2021-03-16 MED ORDER — EZETIMIBE 10 MG PO TABS
10.0000 mg | ORAL_TABLET | Freq: Every day | ORAL | Status: DC
  Administered 2021-03-17 – 2021-03-19 (×3): 10 mg via ORAL
  Filled 2021-03-16 (×4): qty 1

## 2021-03-16 MED ORDER — VITAMIN B-12 1000 MCG PO TABS
1000.0000 ug | ORAL_TABLET | Freq: Every day | ORAL | Status: DC
  Administered 2021-03-16 – 2021-03-19 (×4): 1000 ug via ORAL
  Filled 2021-03-16 (×4): qty 1

## 2021-03-16 MED ORDER — PANTOPRAZOLE SODIUM 40 MG PO TBEC
40.0000 mg | DELAYED_RELEASE_TABLET | Freq: Every day | ORAL | Status: DC
  Administered 2021-03-17 – 2021-03-19 (×3): 40 mg via ORAL
  Filled 2021-03-16 (×3): qty 1

## 2021-03-16 MED ORDER — MOMETASONE FURO-FORMOTEROL FUM 200-5 MCG/ACT IN AERO
2.0000 | INHALATION_SPRAY | Freq: Two times a day (BID) | RESPIRATORY_TRACT | Status: DC
  Administered 2021-03-17 – 2021-03-19 (×5): 2 via RESPIRATORY_TRACT
  Filled 2021-03-16 (×2): qty 8.8

## 2021-03-16 NOTE — ED Notes (Signed)
L PIV site leaking, dressing and catheter changed, no longer leaking. Pt ate a little off dinner tray, additional soda provided, no other needs at this time.

## 2021-03-16 NOTE — ED Notes (Signed)
Pt given dinner tray.

## 2021-03-16 NOTE — Progress Notes (Signed)
Remdesivir - Pharmacy Brief Note   O:  ALT: 16 CXR: Chronic patchy density over the left mid to lower lung. This may be slightly worse over the left base which may be due to atelectasis or early superimposed infection. SpO2: 98% on 2 L [place]   A/P:  Remdesivir 200 mg IVPB once followed by 100 mg IVPB daily x 4 days.   Dorena Bodo, PharmD 04/01/2021 2:45 PM

## 2021-03-16 NOTE — H&P (Signed)
History and Physical    Donna Brennan LOV:[id number] DOB: [birth year] DOA: 03/15/2021  PCP: Adin Hector, MD   Patient coming from: Home  I have personally briefly reviewed patient's old medical records in [place]  Chief Complaint: Cough  HPI: Donna Brennan is a 75 y.o. 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  Patient tested positive at home for the COVID-19 virus on the morning of her admission.  She has been exposed to her husband who is currently ill from the COVID-19 virus. She does have a history of a chronic cough but it is worse and is now productive of yellow phlegm associated with worsening shortness of breath.  Upon arrival to the ER she had room air pulse oximetry of 85% and was placed on 2 L of oxygen with improvement in her pulse oximetry to 92%. She received 2 doses of the COVID-19 vaccine but has not received the booster shot yet. She complains of pleuritic chest pain but denies having any nausea, no vomiting, no diarrhea, no dizziness, no lightheadedness, no fever, no urinary symptoms, no chills, no abdominal pain, no blurred vision, no anorexia, no palpitations or diaphoresis. Labs show sodium 134, potassium 4.5, chloride 101, bicarb 19, glucose 293, BUN 29, creatinine 1.5 above a baseline of 0.8, calcium 8.4, alkaline phosphatase 113, albumin 2.3, AST 39, ALT 16, total protein 8.9, BNP 426, troponin 43 << 18, lactic acid 4.8 >> 1.4, procalcitonin 0.56, white count 12.8, hemoglobin 9.8, hematocrit 30.6, MCV 102, RDW 18.2, platelet count 222, D-dimer 3.42, PT 15.5, INR 1.2 Patient's point-of-care SARS coronavirus 2 test is positive Chest x-ray reviewed by me shows chronic patchy density over the left mid to lower  lung. This may be slightly worse over the left base which may be due to atelectasis or early superimposed infection. CT angiogram of the chest shows no evidence of pulmonary embolism. Development of multifocal peripheral predominant ground-glass opacities, highly suspicious for COVID-19 pneumonia. Left lower lobe pulmonary opacities are slightly progressive and likely represent a combination of COVID-19 pneumonia and chronic infection or aspiration. Similar appearance of probable radiation induced consolidation within the lingula and anterior left lower lobe. Anterior left lower lobe nodularity is similar. Subcarinal adenopathy, mild and new since 02/18/2021. Most likely reactive. Metastatic disease could look similar. Recommend attention on follow-up.Coronary artery atherosclerosis. Aortic Atherosclerosis Right hepatic lobe metastasis, suboptimally evaluated secondary to bolus timing. Twelve-lead EKG reviewed by me shows sinus tachycardia   ED Course: Patient is a 75 year old Caucasian female with a history of metastatic lung cancer on chemotherapy, COPD, diabetes mellitus with stage IV chronic kidney disease who presents to the emergency room for evaluation of worsening shortness of breath from her baseline associated with a cough productive of yellow phlegm. She had a home positive COVID-19 test which was confirmed in the emergency room and was exposed to her husband who is currently ill from COVID-19 virus. Patient was hypoxic upon arrival to the ER with room air pulse oximetry of 85% and is currently on 2 L of oxygen with improvement in her pulse oximetry to 94%. Imaging is consistent with COVID-pneumonia and patient will be admitted to the hospital for further evaluation.     Review of Systems: As per HPI otherwise all other systems reviewed  and negative.    Past Medical History:  Diagnosis Date   CKD (chronic kidney disease), stage IV (HCC)    Colon polyps    COPD (chronic  obstructive pulmonary disease) (HCC)    Diabetes mellitus without complication (HCC)    GERD (gastroesophageal reflux disease)    Headache    Hepatitis    when in 4th or 5th grade.  Resolved   Hyperlipidemia    IBS (irritable bowel syndrome)    Peptic ulcer    Tobacco abuse    Vertigo    No "bad" episodes in over 1 year   Wears dentures    partial ujpper and lower    Past Surgical History:  Procedure Laterality Date   ABDOMINAL HYSTERECTOMY     carbuncle removal     urethra   CATARACT EXTRACTION W/PHACO Left 10/29/2020   Procedure: CATARACT EXTRACTION PHACO AND INTRAOCULAR LENS PLACEMENT (IOC) LEFT DIABETIC 1051 01:00.0 ;  Surgeon: Eulogio Bear, MD;  Location: [place];  Service: Ophthalmology;  Laterality: Left;  Diabetic - insulin   CATARACT EXTRACTION W/PHACO Right 11/19/2020   Procedure: CATARACT EXTRACTION PHACO AND INTRAOCULAR LENS PLACEMENT (IOC) RIGHT DIABETIC;  Surgeon: Eulogio Bear, MD;  Location: [place];  Service: Ophthalmology;  Laterality: Right;  16.54 1:40.0   COLONOSCOPY     COLONOSCOPY WITH PROPOFOL N/A 11/05/2017   Procedure: COLONOSCOPY WITH PROPOFOL;  Surgeon: Lollie Sails, MD;  Location: [facility] ENDOSCOPY;  Service: Endoscopy;  Laterality: N/A;   ESOPHAGOGASTRODUODENOSCOPY     ESOPHAGOGASTRODUODENOSCOPY (EGD) WITH PROPOFOL N/A 11/05/2017   Procedure: ESOPHAGOGASTRODUODENOSCOPY (EGD) WITH PROPOFOL;  Surgeon: Lollie Sails, MD;  Location: [facility] ENDOSCOPY;  Service: Endoscopy;  Laterality: N/A;   IR IMAGING GUIDED PORT INSERTION  12/13/2020   IR IMAGING GUIDED PORT INSERTION  01/25/2021   VIDEO BRONCHOSCOPY WITH ENDOBRONCHIAL NAVIGATION N/A 12/02/2019   Procedure: VIDEO BRONCHOSCOPY WITH ENDOBRONCHIAL NAVIGATION;  Surgeon: Ottie Glazier, MD;  Location: [facility]S;  Service: Thoracic;  Laterality: N/A;   VIDEO BRONCHOSCOPY WITH ENDOBRONCHIAL ULTRASOUND N/A 12/02/2019   Procedure: VIDEO BRONCHOSCOPY WITH ENDOBRONCHIAL ULTRASOUND;   Surgeon: Ottie Glazier, MD;  Location: [facility] OR[facility];  Service: Thoracic;  Laterality: N/A;     reports that she has been smoking cigarettes. She has a 11.25 pack-year smoking history. She has never used smokeless tobacco. She reports that she does not drink alcohol and does not use drugs.  Allergies  Allergen Reactions   Penicillins Anaphylaxis and Rash    Did it involve swelling of the face/tongue/throat, SOB, or low BP? Yes Did it involve sudden or severe rash/hives, skin peeling, or any reaction on the inside of your mouth or nose? Yes Did you need to seek medical attention at a hospital or doctor's office? Yes When did it last happen?More than 40 years ago If all above answers are "NO", may proceed with cephalosporin use.    Metformin Other (See Comments)    Upset stomach   Sulfa Antibiotics Nausea And Vomiting    Family History  Problem Relation Age of Onset   Breast cancer Neg Hx       Prior to Admission medications   Medication Sig Start Date End Date Taking? Authorizing Provider  acetaminophen (TYLENOL) 500 MG tablet Take 1,000 mg by mouth every 6 (six) hours as needed (for pain.).    [provider]  albuterol (VENTOLIN HFA) 108 (90 Base) MCG/ACT inhaler Inhale 1-2 puffs into the lungs every 6 (six) hours as needed for wheezing or  shortness of breath. 02/10/20   Cammie Sickle, MD  chlorpheniramine-HYDROcodone (TUSSIONEX) 10-8 MG/5ML SUER Take 5 mLs by mouth at bedtime as needed for cough. 02/08/21   Cammie Sickle, MD  diazepam (VALIUM) 5 MG tablet Take 5 mg by mouth daily as needed (for vertigo).    [provider]  ezetimibe (ZETIA) 10 MG tablet Take 10 mg by mouth daily.     [provider]  Fluticasone-Salmeterol (ADVAIR) 250-50 MCG/DOSE AEPB Inhale 1 puff into the lungs 2 (two) times daily.    [provider]  folic acid (FOLVITE) 1 MG tablet Take 1 tablet (1 mg total) by mouth daily. 12/14/20   Cammie Sickle, MD   hyoscyamine (LEVBID) 0.375 MG 12 hr tablet Take 0.375 mg by mouth daily as needed (abdominal cramping).  03/24/19   [provider]  Ipratropium-Albuterol (COMBIVENT) 20-100 MCG/ACT AERS respimat Inhale 1 puff into the lungs every 4 (four) hours as needed for wheezing.    [provider]  ipratropium-albuterol (DUONEB) 0.5-2.5 (3) MG/3ML SOLN Take 3 mLs by nebulization every 6 (six) hours as needed (wheezing). 12/28/20   Verlon Au, NP  LANTUS SOLOSTAR 100 UNIT/ML Solostar Pen Inject 12-20 Units into the skin 2 (two) times daily. 12U in AM, 20U in PM 10/25/19   [provider]  lidocaine-prilocaine (EMLA) cream Apply 1 application topically as needed. 12/05/20   Cammie Sickle, MD  megestrol (MEGACE ES) 625 MG/5ML suspension Take 5 mLs (625 mg total) by mouth daily. 02/25/21   Cammie Sickle, MD  Multiple Vitamin (MULTIVITAMIN WITH MINERALS) TABS tablet Take 1 tablet by mouth daily. One-A-Day for Women    [provider]  nystatin (MYCOSTATIN) 100000 UNIT/ML suspension Take 5 mLs by mouth 2 (two) times daily as needed (throat irritation.).    [provider]  omeprazole (PRILOSEC) 20 MG capsule Take 20 mg by mouth daily before breakfast.     [provider]  ondansetron (ZOFRAN) 8 MG tablet Take 1 tablet (8 mg total) by mouth every 8 (eight) hours as needed for nausea or vomiting. 12/05/20   Cammie Sickle, MD  prochlorperazine (COMPAZINE) 10 MG tablet Take 1 tablet (10 mg total) by mouth every 6 (six) hours as needed for nausea or vomiting. 12/05/20   Cammie Sickle, MD  rosuvastatin (CRESTOR) 40 MG tablet Take 40 mg by mouth every evening.     [provider]  vitamin B-12 (CYANOCOBALAMIN) 1000 MCG tablet Take 1,000 mcg by mouth daily.    [provider]    Physical Exam: Vitals:   03/28/2021 1409 03/08/2021 1415 03/20/2021 1500 03/10/2021 1520  BP: (!) 107/57 (!) 108/48 116/65   Pulse: (!) 101 99 (!) 114  (!) 106  Resp: 19 (!) 23 (!) 29 (!) 24  Temp:      TempSrc:      SpO2: 98% 100% 99% 99%  Weight:      Height:         Vitals:   03/06/2021 1409 03/19/2021 1415 03/13/2021 1500 03/06/2021 1520  BP: (!) 107/57 (!) 108/48 116/65   Pulse: (!) 101 99 (!) 114 (!) 106  Resp: 19 (!) 23 (!) 29 (!) 24  Temp:      TempSrc:      SpO2: 98% 100% 99% 99%  Weight:      Height:          Constitutional: Alert and oriented x 3 . Not in any apparent distress.  Chronically ill-appearing.  Has a constant wet sounding cough HEENT:      Head: Normocephalic and atraumatic.         Eyes: PERLA, EOMI, Conjunctivae pallor. Sclera is non-icteric.       Mouth/Throat: Mucous membranes are moist.       Neck: Supple with no signs of meningismus. Cardiovascular: Regular rate and rhythm. No murmurs, gallops, or rubs. 2+ symmetrical distal pulses are present . No JVD. No LE edema Respiratory: Tachypnea.diffuse rhonchi in both lung fields bilaterally.  Faint wheezes  Gastrointestinal: Soft, non tender, and non distended with positive bowel sounds.  Genitourinary: No CVA tenderness. Musculoskeletal: Nontender with normal range of motion in all extremities. No cyanosis, or erythema of extremities. Neurologic:  Face is symmetric. Moving all extremities. No gross focal neurologic deficits . Skin: Skin is warm, dry.  No rash or ulcers Psychiatric: Mood and affect are normal    Labs on Admission: I have personally reviewed following labs and imaging studies  CBC: Recent Labs  Lab 03/11/21 0854 03/20/2021 1225  WBC 3.8* 12.8*  NEUTROABS 2.5 10.3*  HGB 9.5* 9.8*  HCT 28.5* 30.6*  MCV 100.0 102.7*  PLT 323 710   Basic Metabolic Panel: Recent Labs  Lab 03/11/21 0854 03/14/2021 1225  NA 133* 134*  K 4.0 4.5  CL 102 101  CO2 20* 19*  GLUCOSE 90 293*  BUN 15 29*  CREATININE 0.88 1.50*  CALCIUM 8.0* 8.4*   GFR: Estimated Creatinine Clearance: 24.7 mL/min (A) (by C-G formula based on SCr of 1.5 mg/dL  (H)). Liver Function Tests: Recent Labs  Lab 03/11/21 0854 03/27/2021 1225  AST 28 39  ALT 17 16  ALKPHOS 68 113  BILITOT 0.3 0.9  PROT 7.5 8.3*  ALBUMIN 2.2* 2.3*   No results for input(s): LIPASE, AMYLASE in the last 168 hours. No results for input(s): AMMONIA in the last 168 hours. Coagulation Profile: Recent Labs  Lab 03/12/2021 1225  INR 1.2   Cardiac Enzymes: No results for input(s): CKTOTAL, CKMB, CKMBINDEX, TROPONINI in the last 168 hours. BNP (last 3 results) No results for input(s): PROBNP in the last 8760 hours. HbA1C: No results for input(s): HGBA1C in the last 72 hours. CBG: No results for input(s): GLUCAP in the last 168 hours. Lipid Profile: No results for input(s): CHOL, HDL, LDLCALC, TRIG, CHOLHDL, LDLDIRECT in the last 72 hours. Thyroid Function Tests: No results for input(s): TSH, T4TOTAL, FREET4, T3FREE, THYROIDAB in the last 72 hours. Anemia Panel: No results for input(s): VITAMINB12, FOLATE, FERRITIN, TIBC, IRON, RETICCTPCT in the last 72 hours. Urine analysis: No results found for: COLORURINE, APPEARANCEUR, LABSPEC, PHURINE, GLUCOSEU, HGBUR, BILIRUBINUR, KETONESUR, PROTEINUR, UROBILINOGEN, NITRITE, LEUKOCYTESUR  Radiological Exams on Admission: CT Angio Chest PE W and/or Wo Contrast  Result Date: 03/24/2021 CLINICAL DATA:  Weakness. Cough and shortness of breath. COVID positive. History of COPD. EXAM: CT ANGIOGRAPHY CHEST WITH CONTRAST TECHNIQUE: Multidetector CT imaging of the chest was performed using the standard protocol during bolus administration of intravenous contrast. Multiplanar CT image reconstructions and MIPs were obtained to evaluate the vascular anatomy. CONTRAST:  77mL OMNIPAQUE IOHEXOL 350 MG/ML SOLN COMPARISON:  Plain film of earlier today.  Chest CT of 02/19/2021. FINDINGS: Cardiovascular: The quality of this exam for evaluation of pulmonary embolism is good. No evidence of pulmonary embolism. Right Port-A-Cath tip low SVC. Aortic  atherosclerosis. Normal heart size, without pericardial effusion. Three vessel coronary artery calcification. Mediastinum/Nodes: No supraclavicular adenopathy. Index precarinal 7 mm node on 36/4 is similar  to 8 mm on the prior. A node within the subcarinal station with extension into the azygoesophageal recess measures 1.4 cm on 44/4 versus 7 mm on the prior exam (when remeasured). Left perihilar soft tissue thickening which is likely radiation induced. No well-defined adenopathy. Lungs/Pleura: Trace left pleural fluid. Left endobronchial compression is felt to be similar to on the prior. Worsening left lower lobe aeration with progressive airspace and ground-glass opacity. Development of bilateral, right greater than left peripheral predominant ground-glass. Similar dependent lingular consolidation. Lateral left lower lobe 7 mm lung nodule is unchanged on 52/6. The more central anterior left lower lobe nodule is likely obscured by airspace disease. Upper Abdomen: Right hepatic lobe treated metastasis is not well evaluated secondary to bolus timing. High left hepatic lobe cysts. Normal imaged portions of the spleen, stomach, pancreas, gallbladder, kidneys, adrenal glands. Abdominal aortic and branch vessel atherosclerosis. Musculoskeletal: No acute osseous abnormality. Review of the MIP images confirms the above findings. IMPRESSION: 1.  No evidence of pulmonary embolism. 2. Development of multifocal peripheral predominant ground-glass opacities, highly suspicious for COVID-19 pneumonia. 3. Left lower lobe pulmonary opacities are slightly progressive and likely represent a combination of COVID-19 pneumonia and chronic infection or aspiration. 4. Similar appearance of probable radiation induced consolidation within the lingula and anterior left lower lobe. Anterior left lower lobe nodularity is similar. 5. Subcarinal adenopathy, mild and new since 02/18/2021. Most likely reactive. Metastatic disease could look  similar. Recommend attention on follow-up. 6. Coronary artery atherosclerosis. Aortic Atherosclerosis (ICD10-I70.0). 7. Right hepatic lobe metastasis, suboptimally evaluated secondary to bolus timing. Electronically Signed   By: Abigail Miyamoto M.D.   On: 03/18/2021 14:56   DG Chest Portable 1 View  Result Date: 03/27/2021 CLINICAL DATA:  Shortness of breath with productive cough and weakness. COVID-19 positive this morning. EXAM: PORTABLE CHEST 1 VIEW COMPARISON:  12/28/2020 and chest CT 02/18/2021 FINDINGS: Right IJ Port-A-Cath unchanged. Lungs are adequately inflated demonstrate patchy density over the left mid to lower lung which may be slightly worse in the left base. Extensive bilateral costochondral calcification. Cardiomediastinal silhouette and remainder of the exam is unchanged. IMPRESSION: Chronic patchy density over the left mid to lower lung. This may be slightly worse over the left base which may be due to atelectasis or early superimposed infection. Electronically Signed   By: Marin Olp M.D.   On: 04/01/2021 13:08     Assessment/Plan Principal Problem:   Sepsis ([place]) Active Problems:   Hypertension   CKD stage 3 due to type 2 diabetes mellitus ([place])   Primary cancer of left lower lobe of lung ([place])   Pneumonia due to COVID-19 virus   Acute respiratory failure due to COVID-19 [facility])   COPD with acute exacerbation (HCC)   AKI (acute kidney injury) ([place])     Sepsis ([place]) Most likely secondary to COVID-19 pneumonia with superimposed bacterial infection As evidenced by tachycardia, tachypnea, leukocytosis and lactic acidosis with imaging showing bilateral opacities in both lung fields. Patient received sepsis fluid bolus with improvement in her lactic acid level Place patient empirically on IV Rocephin and Zithromax as well as remdesivir per protocol Continue IV fluid hydration Monitor respiratory status closely Follow-up results of blood cultures    COVID-19 pneumonia with  acute respiratory failure Patient's initial positive test 04/04/2021 She received 2 doses of the COVID-19 vaccine but is here to get the booster dose She was exposed to her husband who also has infection from the COVID-19 virus Imaging is suggestive of COVID-pneumonia and  patient was hypoxic upon arrival to the ER with room air pulse oximetry of 85% and is currently on 2 L of oxygen with improvement in her pulse oximetry to 94% Place patient on remdesivir per protocol Monitor inflammatory markers Place patient on IV Solu-Medrol Supportive care with bronchodilator therapy, inhaled steroids, antitussives and vitamins    Acute kidney injury At baseline patient has a serum creatinine of 0.8 but on admission today it is 1.5 Gentle IV fluid hydration Repeat renal parameters in a.m.    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 All questions and concerns have been addressed. She verbalizes understanding and agrees with the plan.  Status was discussed and she is a full code Disposition Plan: Back to previous home environment Consults called: Oncology Status: At the time of admission, it appears that the appropriate admission status for this patient is inpatient. This is judged to be reasonable and necessary in order to provide the required intensity of service to ensure the patient's safety given the presenting symptoms,  physical exam findings and initial radiographic and laboratory data in the context of their comorbid conditions. Patient requires inpatient status due to high intensity of service, high risk for further deterioration and high frequency of surveillance required.    Collier Bullock MD [facility]     03/08/2021, 3:36 PM

## 2021-03-16 NOTE — Consult Note (Signed)
PHARMACY -  BRIEF ANTIBIOTIC NOTE   Pharmacy has received consult(s) for aztreonam and vancomycin from an ED provider. Patient is also ordered metronidazole. The patient's profile has been reviewed for ht/wt/allergies/indication/available labs.    Patient with PCN allergy (anaphylaxis / rash). No documentation of receipt of PCN or cephalosporins within our system. Further investigation into allergy may be warranted.  One time order(s) placed for --Vancomycin 1 g IV --Aztreonam 2 g IV   Further antibiotics/pharmacy consults should be ordered by admitting physician if indicated.                       Thank you, Benita Gutter 03/21/2021  2:01 PM

## 2021-03-16 NOTE — ED Provider Notes (Signed)
[facility] Emergency Department Provider Note  ____________________________________________   Event Date/Time   First MD Initiated Contact with Patient 03/30/2021 1212     (approximate)  I have reviewed the triage vital signs and the nursing notes.   HISTORY  Chief Complaint Shortness of Breath    HPI Donna Brennan is a 75 y.o. 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  Patient had a positive COVID test this a.m. but on review of records patient had a patient message stating that patient had tested positive for COVID also 6/6/patient comes in for worsening shortness of breath, constant, worse with movement, better at rest.  Patient was given oxygen.  Denies any leg swelling, chest pain, abdominal pain.  Does report a frequent cough as well.  Patient reports taking her inhalers with mild improvement.          Past Medical History:  Diagnosis Date   CKD (chronic kidney disease), stage IV (HCC)    Colon polyps    COPD (chronic obstructive pulmonary disease) (HCC)    Diabetes mellitus without complication (HCC)    GERD (gastroesophageal reflux disease)    Headache    Hepatitis    when in 4th or 5th grade.  Resolved   Hyperlipidemia    IBS (irritable bowel syndrome)    Peptic ulcer    Tobacco abuse    Vertigo    No "bad" episodes in over 1 year   Wears dentures    partial ujpper and lower    Patient Active Problem List   Diagnosis Date Noted   Goals of care, counseling/discussion 12/04/2020   Primary cancer of left lower lobe of lung ([place]) 12/21/2019   Hypertension 12/13/2019   Chronic obstructive pulmonary disease with acute exacerbation ([place]) 12/05/2019   Stage 3a chronic kidney disease ([place]) 09/05/2019   Monoclonal gammopathy 09/27/2018   Adrenal nodule ([place]) 09/15/2018   Aortic atherosclerosis ([place]) 09/15/2018   B12 deficiency 01/06/2018    Personal history of tobacco use, presenting hazards to health 01/25/2017   Dizziness 06/21/2013   Coronary artery calcification seen on CAT scan 03/24/2013   Calcified granuloma of lung ([place]) 02/14/2013   Uncontrolled diabetes with stage 3 chronic kidney disease GFR 30-59 ([place]) 02/14/2013   Hyperlipidemia 01/20/2012    Past Surgical History:  Procedure Laterality Date   ABDOMINAL HYSTERECTOMY     carbuncle removal     urethra   CATARACT EXTRACTION W/PHACO Left 10/29/2020   Procedure: CATARACT EXTRACTION PHACO AND INTRAOCULAR LENS PLACEMENT (IOC) LEFT DIABETIC 1051 01:00.0 ;  Surgeon: Eulogio Bear, MD;  Location: [facility];  Service: Ophthalmology;  Laterality: Left;  Diabetic - insulin   CATARACT EXTRACTION W/PHACO Right 11/19/2020   Procedure: CATARACT EXTRACTION PHACO AND INTRAOCULAR LENS PLACEMENT (IOC) RIGHT DIABETIC;  Surgeon: Eulogio Bear, MD;  Location: [facility];  Service: Ophthalmology;  Laterality: Right;  16.54 1:40.0   COLONOSCOPY     COLONOSCOPY WITH PROPOFOL N/A 11/05/2017   Procedure: COLONOSCOPY WITH PROPOFOL;  Surgeon: Lollie Sails, MD;  Location: [facility] ENDOSCOPY;  Service: Endoscopy;  Laterality: N/A;   ESOPHAGOGASTRODUODENOSCOPY     ESOPHAGOGASTRODUODENOSCOPY (EGD) WITH PROPOFOL N/A 11/05/2017   Procedure: ESOPHAGOGASTRODUODENOSCOPY (EGD) WITH PROPOFOL;  Surgeon: Lollie Sails, MD;  Location: [facility] ENDOSCOPY;  Service: Endoscopy;  Laterality: N/A;   IR IMAGING GUIDED PORT INSERTION  12/13/2020   IR IMAGING GUIDED PORT INSERTION  01/25/2021   VIDEO  BRONCHOSCOPY WITH ENDOBRONCHIAL NAVIGATION N/A 12/02/2019   Procedure: VIDEO BRONCHOSCOPY WITH ENDOBRONCHIAL NAVIGATION;  Surgeon: Ottie Glazier, MD;  Location: [facility] ORS;  Service: Thoracic;  Laterality: N/A;   VIDEO BRONCHOSCOPY WITH ENDOBRONCHIAL ULTRASOUND N/A 12/02/2019   Procedure: VIDEO BRONCHOSCOPY WITH ENDOBRONCHIAL ULTRASOUND;  Surgeon: Ottie Glazier, MD;  Location: [facility] ORS;   Service: Thoracic;  Laterality: N/A;    Prior to Admission medications   Medication Sig Start Date End Date Taking? Authorizing Provider  acetaminophen (TYLENOL) 500 MG tablet Take 1,000 mg by mouth every 6 (six) hours as needed (for pain.).    [provider]  albuterol (VENTOLIN HFA) 108 (90 Base) MCG/ACT inhaler Inhale 1-2 puffs into the lungs every 6 (six) hours as needed for wheezing or shortness of breath. 02/10/20   Cammie Sickle, MD  chlorpheniramine-HYDROcodone (TUSSIONEX) 10-8 MG/5ML SUER Take 5 mLs by mouth at bedtime as needed for cough. 02/08/21   Cammie Sickle, MD  diazepam (VALIUM) 5 MG tablet Take 5 mg by mouth daily as needed (for vertigo).    [provider]  ezetimibe (ZETIA) 10 MG tablet Take 10 mg by mouth daily.     [provider]  Fluticasone-Salmeterol (ADVAIR) 250-50 MCG/DOSE AEPB Inhale 1 puff into the lungs 2 (two) times daily.    [provider]  folic acid (FOLVITE) 1 MG tablet Take 1 tablet (1 mg total) by mouth daily. 12/14/20   Cammie Sickle, MD  hyoscyamine (LEVBID) 0.375 MG 12 hr tablet Take 0.375 mg by mouth daily as needed (abdominal cramping).  03/24/19   [provider]  Ipratropium-Albuterol (COMBIVENT) 20-100 MCG/ACT AERS respimat Inhale 1 puff into the lungs every 4 (four) hours as needed for wheezing.    [provider]  ipratropium-albuterol (DUONEB) 0.5-2.5 (3) MG/3ML SOLN Take 3 mLs by nebulization every 6 (six) hours as needed (wheezing). 12/28/20   Verlon Au, NP  LANTUS SOLOSTAR 100 UNIT/ML Solostar Pen Inject 12-20 Units into the skin 2 (two) times daily. 12U in AM, 20U in PM 10/25/19   [provider]  lidocaine-prilocaine (EMLA) cream Apply 1 application topically as needed. 12/05/20   Cammie Sickle, MD  megestrol (MEGACE ES) 625 MG/5ML suspension Take 5 mLs (625 mg total) by mouth daily. 02/25/21   Cammie Sickle, MD  Multiple Vitamin (MULTIVITAMIN  WITH MINERALS) TABS tablet Take 1 tablet by mouth daily. One-A-Day for Women    [provider]  nystatin (MYCOSTATIN) 100000 UNIT/ML suspension Take 5 mLs by mouth 2 (two) times daily as needed (throat irritation.).    [provider]  omeprazole (PRILOSEC) 20 MG capsule Take 20 mg by mouth daily before breakfast.     [provider]  ondansetron (ZOFRAN) 8 MG tablet Take 1 tablet (8 mg total) by mouth every 8 (eight) hours as needed for nausea or vomiting. 12/05/20   Cammie Sickle, MD  prochlorperazine (COMPAZINE) 10 MG tablet Take 1 tablet (10 mg total) by mouth every 6 (six) hours as needed for nausea or vomiting. 12/05/20   Cammie Sickle, MD  rosuvastatin (CRESTOR) 40 MG tablet Take 40 mg by mouth every evening.     [provider]  vitamin B-12 (CYANOCOBALAMIN) 1000 MCG tablet Take 1,000 mcg by mouth daily.    [provider]    Allergies Penicillins, Metformin, and Sulfa antibiotics  Family History  Problem Relation Age of Onset   Breast cancer Neg Hx     Social History Social History  Tobacco Use   Smoking status: Every Day    Packs/day: 0.25    Years: 45.00    Pack years: 11.25    Types: Cigarettes    Last attempt to quit: 08/06/2020    Years since quitting: 0.6   Smokeless tobacco: Never  Vaping Use   Vaping Use: Never used  Substance Use Topics   Alcohol use: No   Drug use: No      Review of Systems Constitutional: No fever/chills Eyes: No visual changes. ENT: No sore throat. Cardiovascular: No chest pain Respiratory: Positive for SOB, positive cough Gastrointestinal: No abdominal pain.  No nausea, no vomiting.  No diarrhea.  No constipation. Genitourinary: Negative for dysuria. Musculoskeletal: Negative for back pain. Skin: Negative for rash. Neurological: Negative for headaches, focal weakness or numbness. All other ROS negative ____________________________________________   PHYSICAL  EXAM:  VITAL SIGNS: ED Triage Vitals  Enc Vitals Group     BP 03/12/2021 1208 111/62     Pulse Rate 03/29/2021 1208 (!) 131     Resp 03/07/2021 1208 20     Temp 03/23/2021 1208 98 F (36.7 C)     Temp Source 03/17/2021 1208 Oral     SpO2 03/22/2021 1208 (!) 85 %     Weight 04/02/2021 1211 125 lb (56.7 kg)     Height 03/13/2021 1211 5' 1.5" (1.562 m)     Head Circumference --      Peak Flow --      Pain Score 03/31/2021 1209 8     Pain Loc --      Pain Edu? --      Excl. in [place]? --     Constitutional: Alert and oriented. Well appearing and in no acute distress. Eyes: Conjunctivae are normal. EOMI. Head: Atraumatic. Nose: No congestion/rhinnorhea. Mouth/Throat: Mucous membranes are moist.   Neck: No stridor. Trachea Midline. FROM Cardiovascular: Tachycardic, regular rhythm. Grossly normal heart sounds.  Good peripheral circulation. Respiratory: Increased work of breathing but no wheezing on 2 L of oxygen Gastrointestinal: Soft and nontender. No distention. No abdominal bruits.  Musculoskeletal: No lower extremity tenderness nor edema.  No joint effusions. Neurologic:  Normal speech and language. No gross focal neurologic deficits are appreciated.  Skin:  Skin is warm, dry and intact. No rash noted. Psychiatric: Mood and affect are normal. Speech and behavior are normal. GU: Deferred   ____________________________________________   LABS (all labs ordered are listed, but only abnormal results are displayed)  Labs Reviewed  COMPREHENSIVE METABOLIC PANEL - Abnormal; Notable for the following components:      Result Value   Sodium 134 (*)    CO2 19 (*)    Glucose, Bld 293 (*)    BUN 29 (*)    Creatinine, Ser 1.50 (*)    Calcium 8.4 (*)    Total Protein 8.3 (*)    Albumin 2.3 (*)    GFR, Estimated 36 (*)    All other components within normal limits  LACTIC ACID, PLASMA - Abnormal; Notable for the following components:   Lactic Acid, Venous 4.8 (*)    All other components within normal  limits  CBC WITH DIFFERENTIAL/PLATELET - Abnormal; Notable for the following components:   WBC 12.8 (*)    RBC 2.98 (*)    Hemoglobin 9.8 (*)    HCT 30.6 (*)    MCV 102.7 (*)    RDW 18.2 (*)    Neutro Abs 10.3 (*)    Lymphs Abs 0.5 (*)  Abs Immature Granulocytes 1.25 (*)    All other components within normal limits  PROTIME-INR - Abnormal; Notable for the following components:   Prothrombin Time 15.5 (*)    All other components within normal limits  D-DIMER, QUANTITATIVE - Abnormal; Notable for the following components:   D-Dimer, Quant 3.42 (*)    All other components within normal limits  TROPONIN I (HIGH SENSITIVITY) - Abnormal; Notable for the following components:   Troponin I (High Sensitivity) 43 (*)    All other components within normal limits  CULTURE, BLOOD (ROUTINE X 2)  CULTURE, BLOOD (ROUTINE X 2)  RESP PANEL BY RT-PCR (FLU A&B, COVID) ARPGX2  LACTIC ACID, PLASMA  URINALYSIS, COMPLETE (UACMP) WITH MICROSCOPIC  BRAIN NATRIURETIC PEPTIDE  PROCALCITONIN   ____________________________________________   ED ECG REPORT I, Vanessa Nor[place], the attending physician, personally viewed and interpreted this ECG.  Sinus tachycardia rate of 129, no ST elevation, no T wave inversions, normal intervals ____________________________________________  RADIOLOGY Robert Bellow, personally viewed and evaluated these images (plain radiographs) as part of my medical decision making, as well as reviewing the written report by the radiologist.  ED MD interpretation:  left base infiltrate   Official radiology report(s): DG Chest Portable 1 View  Result Date: 03/21/2021 CLINICAL DATA:  Shortness of breath with productive cough and weakness. COVID-19 positive this morning. EXAM: PORTABLE CHEST 1 VIEW COMPARISON:  12/28/2020 and chest CT 02/18/2021 FINDINGS: Right IJ Port-A-Cath unchanged. Lungs are adequately inflated demonstrate patchy density over the left mid to lower lung which may  be slightly worse in the left base. Extensive bilateral costochondral calcification. Cardiomediastinal silhouette and remainder of the exam is unchanged. IMPRESSION: Chronic patchy density over the left mid to lower lung. This may be slightly worse over the left base which may be due to atelectasis or early superimposed infection. Electronically Signed   By: Marin Olp M.D.   On: 04/01/2021 13:08    ____________________________________________   PROCEDURES  Procedure(s) performed (including Critical Care):  .1-3 Lead EKG Interpretation  Date/Time: 03/06/2021 12:29 PM Performed by: Vanessa Garnett, MD Authorized by: Vanessa Ursina, MD     Interpretation: abnormal     ECG rate:  130s   ECG rate assessment: tachycardic     Rhythm: sinus tachycardia     Ectopy: none     Conduction: normal   .Critical Care  Date/Time: 03/07/2021 2:36 PM Performed by: Vanessa [place], MD Authorized by: Vanessa Idalou, MD   Critical care provider statement:    Critical care time (minutes):  45   Critical care was necessary to treat or prevent imminent or life-threatening deterioration of the following conditions:  Respiratory failure   Critical care was time spent personally by me on the following activities:  Discussions with consultants, evaluation of patient's response to treatment, examination of patient, ordering and performing treatments and interventions, ordering and review of laboratory studies, ordering and review of radiographic studies, pulse oximetry, re-evaluation of patient's condition, obtaining history from patient or surrogate and review of old charts   ____________________________________________   INITIAL IMPRESSION / [place] / ED COURSE   ANYELY CUNNING was evaluated in Emergency Department on 03/21/2021 for the symptoms described in the history of present illness. She was evaluated in the context of the global COVID-19 pandemic, which necessitated consideration that the  patient might be at risk for infection with the SARS-CoV-2 virus that causes COVID-19. Institutional protocols and algorithms that pertain to the  evaluation of patients at risk for COVID-19 are in a state of rapid change based on information released by regulatory bodies including the CDC and federal and state organizations. These policies and algorithms were followed during the patient's care in the ED.     Pt presents with SOB most likely secondary to COVID.  Will get D-dimer as well.  Patient placed on 2 L of oxygen due to hypoxia.  We will give a little bit of gentle fluid for her tachycardia.  We will give some steroids  PNA-will get xray to evaluation Anemia-CBC to evaluate ACS- will get trops Arrhythmia-Will get EKG and keep on monitor.  COVID- will get testing per algorithm.  Patient heart rate is coming down with the fluids and she looks more comfortable on the oxygen on my reassessment.  Her lactate did come back elevated.  Given her D-dimer was positive will get CT scan to evaluate for pulmonary embolism.  1:47 PM  Given her lactate is significantly elevated with tachycardia and an elevated white count sepsis alert was called.  Patient started on broad-spectrum antibiotics and due to penicillin allergy with anaphylaxis we will avoid some cephalosporin  2:36 PM heart rates improving and blood pressure staying stable.  Patient does not need pressors at this time.  Patient was admitted admitted to the hospital team for respiratory failure.  CT scan was done but results are pending.                ____________________________________________   FINAL CLINICAL IMPRESSION(S) / ED DIAGNOSES   Final diagnoses:  Sepsis, due to unspecified organism, unspecified whether acute organ dysfunction present ([place])  Acute respiratory failure with hypoxia ([place])  Pneumonia due to COVID-19 virus     MEDICATIONS GIVEN DURING THIS VISIT:  Medications  metroNIDAZOLE (FLAGYL) IVPB 500 mg  (500 mg Intravenous New Bag/Given 03/31/2021 1433)  vancomycin (VANCOCIN) IVPB 1000 mg/200 mL premix (has no administration in time range)  methylPREDNISolone sodium succinate (SOLU-MEDROL) 125 mg/2 mL injection 125 mg (125 mg Intravenous Given 03/15/2021 1232)  sodium chloride 0.9 % bolus 500 mL (0 mLs Intravenous Stopped 03/24/2021 1343)  sodium chloride 0.9 % bolus 1,000 mL (1,000 mLs Intravenous New Bag/Given 03/11/2021 1359)  sodium chloride 0.9 % bolus 250 mL (250 mLs Intravenous New Bag/Given 03/19/2021 1400)  sodium chloride 0.9 % bolus 1,000 mL (1,000 mLs Intravenous New Bag/Given 03/21/2021 1400)  aztreonam (AZACTAM) 2 g in sodium chloride 0.9 % 100 mL IVPB (0 g Intravenous Stopped 04/01/2021 1427)  iohexol (OMNIPAQUE) 350 MG/ML injection 50 mL (50 mLs Intravenous Contrast Given 03/20/2021 1423)     ED Discharge Orders     None        Note:  This document was prepared using Dragon voice recognition software and may include unintentional dictation errors.   Vanessa Newberg, MD 03/08/2021 1436

## 2021-03-16 NOTE — ED Notes (Signed)
EDP [place] notified of lactic 4.8

## 2021-03-16 NOTE — Progress Notes (Signed)
Elink monitoring for sepsis protocol 

## 2021-03-16 NOTE — ED Notes (Signed)
Pt with CT at this time

## 2021-03-16 NOTE — ED Triage Notes (Signed)
Pt via POV from home. Pt c/o weakness, productive cough, and SOB. Pt is COVID positive this AM, pt took an at home test. Pt is on arrival, 85% on RA. Pt placed on 2L [place] and 92%. Pt is A&Ox4 but WOB is increased. Pt has a hx of COPD.

## 2021-03-16 NOTE — Progress Notes (Signed)
PHARMACIST - PHYSICIAN COMMUNICATION  CONCERNING:  Enoxaparin (Lovenox) for DVT Prophylaxis    RECOMMENDATION: Patient was prescribed enoxaparin 40mg  q24 hours for VTE prophylaxis.   Filed Weights   03/30/2021 1211  Weight: 56.7 kg (125 lb)    Body mass index is 23.24 kg/m.  Estimated Creatinine Clearance: 24.7 mL/min (A) (by C-G formula based on SCr of 1.5 mg/dL (H)).   Patient is candidate for enoxaparin 30mg  every 24 hours based on CrCl <66ml/min or Weight <45kg  DESCRIPTION: Pharmacy has adjusted enoxaparin dose per [facility] policy.  Patient is now receiving enoxaparin 30 mg every 24 hours    Benita Gutter 03/06/2021 2:53 PM

## 2021-03-16 NOTE — Progress Notes (Signed)
CODE SEPSIS - PHARMACY COMMUNICATION  **Broad Spectrum Antibiotics should be administered within 1 hour of Sepsis diagnosis**  Time Code Sepsis Called/Page Received: 1351  Antibiotics Ordered: aztreonam, vancomycin, metronidazole  Time of 1st antibiotic administration: 1401  Additional action taken by pharmacy: N/A   Benita Gutter 03/24/2021  2:03 PM

## 2021-03-17 ENCOUNTER — Encounter: Payer: Self-pay | Admitting: Internal Medicine

## 2021-03-17 DIAGNOSIS — J441 Chronic obstructive pulmonary disease with (acute) exacerbation: Secondary | ICD-10-CM | POA: Diagnosis not present

## 2021-03-17 DIAGNOSIS — R6520 Severe sepsis without septic shock: Secondary | ICD-10-CM

## 2021-03-17 DIAGNOSIS — N179 Acute kidney failure, unspecified: Secondary | ICD-10-CM | POA: Diagnosis not present

## 2021-03-17 DIAGNOSIS — U071 COVID-19: Secondary | ICD-10-CM | POA: Diagnosis not present

## 2021-03-17 DIAGNOSIS — A419 Sepsis, unspecified organism: Secondary | ICD-10-CM

## 2021-03-17 DIAGNOSIS — J9601 Acute respiratory failure with hypoxia: Secondary | ICD-10-CM

## 2021-03-17 DIAGNOSIS — C3432 Malignant neoplasm of lower lobe, left bronchus or lung: Secondary | ICD-10-CM

## 2021-03-17 LAB — COMPREHENSIVE METABOLIC PANEL
ALT: 14 U/L (ref 0–44)
AST: 26 U/L (ref 15–41)
Albumin: 1.7 g/dL — ABNORMAL LOW (ref 3.5–5.0)
Alkaline Phosphatase: 72 U/L (ref 38–126)
Anion gap: 6 (ref 5–15)
BUN: 28 mg/dL — ABNORMAL HIGH (ref 8–23)
CO2: 20 mmol/L — ABNORMAL LOW (ref 22–32)
Calcium: 7.5 mg/dL — ABNORMAL LOW (ref 8.9–10.3)
Chloride: 111 mmol/L (ref 98–111)
Creatinine, Ser: 1.07 mg/dL — ABNORMAL HIGH (ref 0.44–1.00)
GFR, Estimated: 54 mL/min — ABNORMAL LOW (ref >60)
Glucose, Bld: 224 mg/dL — ABNORMAL HIGH (ref 70–99)
Potassium: 3.6 mmol/L (ref 3.5–5.1)
Sodium: 137 mmol/L (ref 135–145)
Total Bilirubin: 0.4 mg/dL (ref 0.3–1.2)
Total Protein: 6 g/dL — ABNORMAL LOW (ref 6.5–8.1)

## 2021-03-17 LAB — CBC WITH DIFFERENTIAL/PLATELET
Abs Immature Granulocytes: 0.06 10*3/uL (ref 0.00–0.07)
Basophils Absolute: 0 10*3/uL (ref 0.0–0.1)
Basophils Relative: 1 %
Eosinophils Absolute: 0 10*3/uL (ref 0.0–0.5)
Eosinophils Relative: 0 %
HCT: 23.2 % — ABNORMAL LOW (ref 36.0–46.0)
Hemoglobin: 7.6 g/dL — ABNORMAL LOW (ref 12.0–15.0)
Immature Granulocytes: 2 %
Lymphocytes Relative: 4 %
Lymphs Abs: 0.2 10*3/uL — ABNORMAL LOW (ref 0.7–4.0)
MCH: 33.8 pg (ref 26.0–34.0)
MCHC: 32.8 g/dL (ref 30.0–36.0)
MCV: 103.1 fL — ABNORMAL HIGH (ref 80.0–100.0)
Monocytes Absolute: 0.3 10*3/uL (ref 0.1–1.0)
Monocytes Relative: 7 %
Neutro Abs: 3.1 10*3/uL (ref 1.7–7.7)
Neutrophils Relative %: 86 %
Platelets: 127 10*3/uL — ABNORMAL LOW (ref 150–400)
RBC: 2.25 MIL/uL — ABNORMAL LOW (ref 3.87–5.11)
RDW: 18.3 % — ABNORMAL HIGH (ref 11.5–15.5)
Smear Review: NORMAL
WBC: 3.7 10*3/uL — ABNORMAL LOW (ref 4.0–10.5)
nRBC: 0 % (ref 0.0–0.2)

## 2021-03-17 LAB — C-REACTIVE PROTEIN: CRP: 19.8 mg/dL — ABNORMAL HIGH (ref <1.0)

## 2021-03-17 LAB — FERRITIN: Ferritin: 1208 ng/mL — ABNORMAL HIGH (ref 11–307)

## 2021-03-17 LAB — URINALYSIS, COMPLETE (UACMP) WITH MICROSCOPIC
Bacteria, UA: NONE SEEN
Bilirubin Urine: NEGATIVE
Glucose, UA: >=500 mg/dL — AB
Hgb urine dipstick: NEGATIVE
Ketones, ur: NEGATIVE mg/dL
Leukocytes,Ua: NEGATIVE
Nitrite: NEGATIVE
Protein, ur: NEGATIVE mg/dL
Specific Gravity, Urine: 1.029 (ref 1.005–1.030)
pH: 5 (ref 5.0–8.0)

## 2021-03-17 LAB — PHOSPHORUS: Phosphorus: 3.3 mg/dL (ref 2.5–4.6)

## 2021-03-17 LAB — GLUCOSE, CAPILLARY
Glucose-Capillary: 307 mg/dL — ABNORMAL HIGH (ref 70–99)
Glucose-Capillary: 242 mg/dL — ABNORMAL HIGH (ref 70–99)

## 2021-03-17 LAB — CORTISOL-AM, BLOOD: Cortisol - AM: 8.1 ug/dL (ref 6.7–22.6)

## 2021-03-17 LAB — PROTIME-INR
INR: 1.4 — ABNORMAL HIGH (ref 0.8–1.2)
Prothrombin Time: 17.4 seconds — ABNORMAL HIGH (ref 11.4–15.2)

## 2021-03-17 LAB — D-DIMER, QUANTITATIVE: D-Dimer, Quant: 2.56 ug/mL-FEU — ABNORMAL HIGH (ref 0.00–0.50)

## 2021-03-17 LAB — CBG MONITORING, ED: Glucose-Capillary: 207 mg/dL — ABNORMAL HIGH (ref 70–99)

## 2021-03-17 LAB — MAGNESIUM: Magnesium: 1.4 mg/dL — ABNORMAL LOW (ref 1.7–2.4)

## 2021-03-17 MED ORDER — MEGESTROL ACETATE 400 MG/10ML PO SUSP
625.0000 mg | Freq: Every day | ORAL | Status: DC
  Administered 2021-03-17 – 2021-03-20 (×4): 625 mg via ORAL
  Filled 2021-03-17 (×5): qty 20

## 2021-03-17 MED ORDER — ENOXAPARIN SODIUM 40 MG/0.4ML IJ SOSY
40.0000 mg | PREFILLED_SYRINGE | INTRAMUSCULAR | Status: DC
  Administered 2021-03-17 – 2021-03-23 (×7): 40 mg via SUBCUTANEOUS
  Filled 2021-03-17 (×7): qty 0.4

## 2021-03-17 NOTE — ED Notes (Signed)
Pt up to bathroom, increased WOB, pt urinated 480ml, urine specimen sent to lab. Pants and pad wet 'I pee everytime I cough,' pt changed into gown, pericare provided, purewick and diaper placed. No other needs at this time

## 2021-03-17 NOTE — ED Notes (Signed)
Lab called reporting questionable morning lab results, phlebotomy to draw

## 2021-03-17 NOTE — Progress Notes (Signed)
PHARMACIST - PHYSICIAN COMMUNICATION  CONCERNING:  Enoxaparin (Lovenox) for DVT Prophylaxis    RECOMMENDATION: Patient was prescribed enoxaparin 30mg  q24 hours for VTE prophylaxis.   Filed Weights   03/19/2021 1211  Weight: 56.7 kg (125 lb)    Body mass index is 23.24 kg/m.  Estimated Creatinine Clearance: 34.6 mL/min (A) (by C-G formula based on SCr of 1.07 mg/dL (H)).   Patient is candidate for enoxaparin 40mg  every 24 hours based on CrCl >51ml/min and Weight >45kg  DESCRIPTION: Pharmacy has adjusted enoxaparin dose per [facility] policy.  Patient is now receiving enoxaparin 40 mg every 24 hours    Benita Gutter 03/17/2021 8:09 AM

## 2021-03-17 NOTE — ED Notes (Signed)
Pt tolerated meal tray.

## 2021-03-17 NOTE — Progress Notes (Addendum)
Progress Note    Donna Brennan  EHM:[id number] DOB: 1946-02-01  DOA: 03/17/2021 PCP: Adin Hector, MD      Brief Narrative:    Medical records reviewed and are as summarized below:  Donna Brennan is a 75 y.o. 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  She presented to the hospital with worsening cough.  She has a history of chronic cough at this cough was much worse.  She was hypoxic in the emergency room with oxygen saturation of 85% on room air.  She tested positive for COVID-19 infection.  She was admitted to the hospital for severe sepsis secondary to COVID-19 pneumonia, with probable super bacterial infection, complicated by acute hypoxic respiratory failure and AKI.    Assessment/Plan:   Principal Problem:   Sepsis ([place]) Active Problems:   Hypertension   CKD stage 3 due to type 2 diabetes mellitus ([place])   Primary cancer of left lower lobe of lung ([place])   Pneumonia due to COVID-19 virus   Acute respiratory failure due to COVID-19 [facility])   COPD with acute exacerbation (HCC)   AKI (acute kidney injury) ([place])    Body mass index is 23.24 kg/m.   Severe sepsis secondary to COVID-19 pneumonia with suspected bacterial infection: Continue remdesivir, steroids and empiric IV antibiotics. She had received 2 doses of COVID-19 vaccine but has not had the booster dose.  She was exposed to her husband who has COVID-19 infection.  Acute hypoxic respiratory failure: She is on 2 L/min oxygen via nasal cannula.  Taper off oxygen as able.  AKI: Improved.  COPD exacerbation: Continue bronchodilators and steroids  Stage IV poorly differentiated adenocarcinoma with unclear primary.  Primary disease suspected to be lung versus hepatobiliary.  Outpatient follow-up with  oncologist.  Pancytopenia: Likely from chemotherapy.  No indication for blood transfusion now.  Hemoglobin is trending down.  Continue to monitor.    Diet Order             Diet heart healthy/carb modified Room service appropriate? Yes; Fluid consistency: Thin  Diet effective now                      Consultants: Oncologist   Procedures: None     Medications:    vitamin C  500 mg Oral Daily   enoxaparin (LOVENOX) injection  40 mg Subcutaneous Q24H   ezetimibe  10 mg Oral Daily   folic acid  1 mg Oral Daily   insulin aspart  0-9 Units Subcutaneous TID WC   insulin detemir  0.075 Units/kg Subcutaneous BID   lidocaine-prilocaine  1 application Topical Once   megestrol  625 mg Oral Daily   methylPREDNISolone (SOLU-MEDROL) injection  60 mg Intravenous Q12H   Followed by   Derrill Memo ON 03/20/2021] predniSONE  50 mg Oral Daily   mometasone-formoterol  2 puff Inhalation BID   multivitamin with minerals  1 tablet Oral Daily   pantoprazole  40 mg Oral Daily   rosuvastatin  40 mg Oral QPM   vitamin B-12  1,000 mcg Oral Daily   zinc sulfate  220 mg Oral Daily   Continuous Infusions:  azithromycin Stopped (03/06/2021 1817)   cefTRIAXone (ROCEPHIN)  IV Stopped (03/15/2021 1533)   remdesivir 100 mg in NS 100 mL Stopped (03/17/21 0956)  Anti-infectives (From admission, onward)    Start     Dose/Rate Route Frequency Ordered Stop   03/17/21 1000  remdesivir 100 mg in sodium chloride 0.9 % 100 mL IVPB       See Hyperspace for full Linked Orders Report.   100 mg 200 mL/hr over 30 Minutes Intravenous Daily 03/10/2021 1442 03/21/21 0959   03/19/2021 1600  remdesivir 200 mg in sodium chloride 0.9% 250 mL IVPB       See Hyperspace for full Linked Orders Report.   200 mg 580 mL/hr over 30 Minutes Intravenous Once 03/29/2021 1442 03/15/2021 1700   03/07/2021 1500  cefTRIAXone (ROCEPHIN) 2 g in sodium chloride 0.9 % 100 mL IVPB        2 g 200 mL/hr over 30 Minutes Intravenous Every 24  hours 03/08/2021 1439     03/28/2021 1500  azithromycin (ZITHROMAX) 500 mg in sodium chloride 0.9 % 250 mL IVPB        500 mg 250 mL/hr over 60 Minutes Intravenous Every 24 hours 03/15/2021 1439     03/27/2021 1400  aztreonam (AZACTAM) 2 g in sodium chloride 0.9 % 100 mL IVPB        2 g 200 mL/hr over 30 Minutes Intravenous  Once 04/02/2021 1346 04/04/2021 1427   03/31/2021 1400  metroNIDAZOLE (FLAGYL) IVPB 500 mg        500 mg 100 mL/hr over 60 Minutes Intravenous  Once 03/18/2021 1346 03/29/2021 1536   04/02/2021 1400  vancomycin (VANCOCIN) IVPB 1000 mg/200 mL premix        1,000 mg 200 mL/hr over 60 Minutes Intravenous  Once 03/09/2021 1346 04/01/2021 1700              Family Communication/Anticipated D/C date and plan/Code Status   DVT prophylaxis: enoxaparin (LOVENOX) injection 40 mg Start: 03/17/21 2200     Code Status: Full Code  Family Communication: None Disposition Plan:    Status is: Inpatient  Remains inpatient appropriate because:IV treatments appropriate due to intensity of illness or inability to take PO  Dispo: The patient is from: Home              Anticipated d/c is to: Home              Patient currently is not medically stable to d/c.   Difficult to place patient No           Subjective:   C/o cough and general weakness  Objective:    Vitals:   03/17/21 0930 03/17/21 1300 03/17/21 1330 03/17/21 1400  BP:  125/77 (!) 113/57 115/60  Pulse: 93 100 95 74  Resp: 20 17 17 17   Temp:      TempSrc:      SpO2: 98% 96% 96% 100%  Weight:      Height:       No data found.  No intake or output data in the 24 hours ending 03/17/21 1434 Filed Weights   03/15/2021 1211  Weight: 56.7 kg    Exam:  GEN: NAD SKIN: Warm and dry EYES: EOMI ENT: MMM CV: RRR PULM: CTA B ABD: soft, ND, NT, +BS CNS: AAO x 3, non focal EXT: No edema or tenderness        Data Reviewed:   I have personally reviewed following labs and imaging studies:  Labs: Labs show  the following:   Basic Metabolic Panel: Recent Labs  Lab 03/11/21 0854 03/15/2021 1225 03/17/21 0648  NA 133* 134*  137  K 4.0 4.5 3.6  CL 102 101 111  CO2 20* 19* 20*  GLUCOSE 90 293* 224*  BUN 15 29* 28*  CREATININE 0.88 1.50* 1.07*  CALCIUM 8.0* 8.4* 7.5*  MG  --   --  1.4*  PHOS  --   --  3.3   GFR Estimated Creatinine Clearance: 34.6 mL/min (A) (by C-G formula based on SCr of 1.07 mg/dL (H)). Liver Function Tests: Recent Labs  Lab 03/11/21 0854 03/29/2021 1225 03/17/21 0648  AST 28 39 26  ALT 17 16 14   ALKPHOS 68 113 72  BILITOT 0.3 0.9 0.4  PROT 7.5 8.3* 6.0*  ALBUMIN 2.2* 2.3* 1.7*   No results for input(s): LIPASE, AMYLASE in the last 168 hours. No results for input(s): AMMONIA in the last 168 hours. Coagulation profile Recent Labs  Lab 03/24/2021 1225 03/17/21 0600  INR 1.2 1.4*    CBC: Recent Labs  Lab 03/11/21 0854 03/06/2021 1225 03/17/21 0648  WBC 3.8* 12.8* 3.7*  NEUTROABS 2.5 10.3* 3.1  HGB 9.5* 9.8* 7.6*  HCT 28.5* 30.6* 23.2*  MCV 100.0 102.7* 103.1*  PLT 323 222 127*   Cardiac Enzymes: No results for input(s): CKTOTAL, CKMB, CKMBINDEX, TROPONINI in the last 168 hours. BNP (last 3 results) No results for input(s): PROBNP in the last 8760 hours. CBG: Recent Labs  Lab 03/11/2021 1805 03/15/2021 2204 03/17/21 0725  GLUCAP 302* 242* 207*   D-Dimer: Recent Labs    03/26/2021 1226 03/17/21 0600  DDIMER 3.42* 2.56*   Hgb A1c: No results for input(s): HGBA1C in the last 72 hours. Lipid Profile: No results for input(s): CHOL, HDL, LDLCALC, TRIG, CHOLHDL, LDLDIRECT in the last 72 hours. Thyroid function studies: No results for input(s): TSH, T4TOTAL, T3FREE, THYROIDAB in the last 72 hours.  Invalid input(s): FREET3 Anemia work up: Recent Labs    03/17/21 0648  FERRITIN 1,208*   Sepsis Labs: Recent Labs  Lab 03/11/21 0854 03/20/2021 1225 03/11/2021 1226 03/14/2021 1408 03/17/21 0648  PROCALCITON  --   --  0.56  --   --   WBC 3.8*  12.8*  --   --  3.7*  LATICACIDVEN  --  4.8*  --  1.4  --     Microbiology Recent Results (from the past 240 hour(s))  Culture, blood (Routine x 2)     Status: None (Preliminary result)   Collection Time: 03/28/2021 12:25 PM   Specimen: BLOOD  Result Value Ref Range Status   Specimen Description BLOOD LEFT AC  Final   Special Requests   Final    BOTTLES DRAWN AEROBIC AND ANAEROBIC Blood Culture adequate volume   Culture   Final    NO GROWTH < 24 HOURS Performed at [facility], [address] [place], [place] [address]    Report Status PENDING  Incomplete  Culture, blood (Routine x 2)     Status: None (Preliminary result)   Collection Time: 03/18/2021 12:25 PM   Specimen: BLOOD  Result Value Ref Range Status   Specimen Description BLOOD RIGHT [facility]  Final   Special Requests   Final    BOTTLES DRAWN AEROBIC AND ANAEROBIC Blood Culture adequate volume   Culture   Final    NO GROWTH < 24 HOURS Performed at [facility], [address] [place], [place] [address]    Report Status PENDING  Incomplete  Resp Panel by RT-PCR (Flu A&B, Covid) Nasopharyngeal Swab     Status: Abnormal   Collection Time: 03/26/2021  12:41 PM   Specimen: Nasopharyngeal Swab; Nasopharyngeal(NP) swabs in vial transport medium  Result Value Ref Range Status   SARS Coronavirus 2 by RT PCR POSITIVE (A) NEGATIVE Final    Comment: RESULT CALLED TO, READ BACK BY AND VERIFIED WITH: RUDD,S AT 1349 ON 03/28/2021 BY MOSLEY,J (NOTE) SARS-CoV-2 target nucleic acids are DETECTED.  The SARS-CoV-2 RNA is generally detectable in upper respiratory specimens during the acute phase of infection. Positive results are indicative of the presence of the identified virus, but do not rule out bacterial infection or co-infection with other pathogens not detected by the test. Clinical correlation with patient history and other diagnostic information is necessary to determine patient infection status. The expected  result is Negative.  Fact Sheet for Patients: EntrepreneurPulse.com.au  Fact Sheet for Healthcare Providers: IncredibleEmployment.be  This test is not yet approved or cleared by the [place] FDA and  has been authorized for detection and/or diagnosis of SARS-CoV-2 by FDA under an Emergency Use Authorization (EUA).  This EUA will remain in effect (meaning this test  can be used) for the duration of  the COVID-19 declaration under Section 564(b)(1) of the Act, 21 U.S.C. section 360bbb-3(b)(1), unless the authorization is terminated or revoked sooner.     Influenza A by PCR NEGATIVE NEGATIVE Final   Influenza B by PCR NEGATIVE NEGATIVE Final    Comment: (NOTE) The Xpert Xpress SARS-CoV-2/FLU/RSV plus assay is intended as an aid in the diagnosis of influenza from Nasopharyngeal swab specimens and should not be used as a sole basis for treatment. Nasal washings and aspirates are unacceptable for Xpert Xpress SARS-CoV-2/FLU/RSV testing.  Fact Sheet for Patients: EntrepreneurPulse.com.au  Fact Sheet for Healthcare Providers: IncredibleEmployment.be  This test is not yet approved or cleared by the [place] FDA and has been authorized for detection and/or diagnosis of SARS-CoV-2 by FDA under an Emergency Use Authorization (EUA). This EUA will remain in effect (meaning this test can be used) for the duration of the COVID-19 declaration under Section 564(b)(1) of the Act, 21 U.S.C. section 360bbb-3(b)(1), unless the authorization is terminated or revoked.  Performed at [facility], [address] [place], [place] [address]     Procedures and diagnostic studies:  CT Angio Chest PE W and/or Wo Contrast  Result Date: 03/19/2021 CLINICAL DATA:  Weakness. Cough and shortness of breath. COVID positive. History of COPD. EXAM: CT ANGIOGRAPHY CHEST WITH CONTRAST TECHNIQUE: Multidetector CT imaging  of the chest was performed using the standard protocol during bolus administration of intravenous contrast. Multiplanar CT image reconstructions and MIPs were obtained to evaluate the vascular anatomy. CONTRAST:  65mL OMNIPAQUE IOHEXOL 350 MG/ML SOLN COMPARISON:  Plain film of earlier today.  Chest CT of 02/19/2021. FINDINGS: Cardiovascular: The quality of this exam for evaluation of pulmonary embolism is good. No evidence of pulmonary embolism. Right Port-A-Cath tip low SVC. Aortic atherosclerosis. Normal heart size, without pericardial effusion. Three vessel coronary artery calcification. Mediastinum/Nodes: No supraclavicular adenopathy. Index precarinal 7 mm node on 36/4 is similar to 8 mm on the prior. A node within the subcarinal station with extension into the azygoesophageal recess measures 1.4 cm on 44/4 versus 7 mm on the prior exam (when remeasured). Left perihilar soft tissue thickening which is likely radiation induced. No well-defined adenopathy. Lungs/Pleura: Trace left pleural fluid. Left endobronchial compression is felt to be similar to on the prior. Worsening left lower lobe aeration with progressive airspace and ground-glass opacity. Development of bilateral, right greater than left peripheral predominant ground-glass. Similar  dependent lingular consolidation. Lateral left lower lobe 7 mm lung nodule is unchanged on 52/6. The more central anterior left lower lobe nodule is likely obscured by airspace disease. Upper Abdomen: Right hepatic lobe treated metastasis is not well evaluated secondary to bolus timing. High left hepatic lobe cysts. Normal imaged portions of the spleen, stomach, pancreas, gallbladder, kidneys, adrenal glands. Abdominal aortic and branch vessel atherosclerosis. Musculoskeletal: No acute osseous abnormality. Review of the MIP images confirms the above findings. IMPRESSION: 1.  No evidence of pulmonary embolism. 2. Development of multifocal peripheral predominant ground-glass  opacities, highly suspicious for COVID-19 pneumonia. 3. Left lower lobe pulmonary opacities are slightly progressive and likely represent a combination of COVID-19 pneumonia and chronic infection or aspiration. 4. Similar appearance of probable radiation induced consolidation within the lingula and anterior left lower lobe. Anterior left lower lobe nodularity is similar. 5. Subcarinal adenopathy, mild and new since 02/18/2021. Most likely reactive. Metastatic disease could look similar. Recommend attention on follow-up. 6. Coronary artery atherosclerosis. Aortic Atherosclerosis (ICD10-I70.0). 7. Right hepatic lobe metastasis, suboptimally evaluated secondary to bolus timing. Electronically Signed   By: Abigail Miyamoto M.D.   On: 03/30/2021 14:56   DG Chest Portable 1 View  Result Date: 03/06/2021 CLINICAL DATA:  Shortness of breath with productive cough and weakness. COVID-19 positive this morning. EXAM: PORTABLE CHEST 1 VIEW COMPARISON:  12/28/2020 and chest CT 02/18/2021 FINDINGS: Right IJ Port-A-Cath unchanged. Lungs are adequately inflated demonstrate patchy density over the left mid to lower lung which may be slightly worse in the left base. Extensive bilateral costochondral calcification. Cardiomediastinal silhouette and remainder of the exam is unchanged. IMPRESSION: Chronic patchy density over the left mid to lower lung. This may be slightly worse over the left base which may be due to atelectasis or early superimposed infection. Electronically Signed   By: Marin Olp M.D.   On: 03/12/2021 13:08               LOS: 1 day   Tinie Mcgloin  [facility]   Pager on [email or web address]. If 7PM-7AM, please contact night-coverage at [email or web address]     03/17/2021, 2:34 PM

## 2021-03-17 NOTE — Consult Note (Signed)
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  INTERVAL HISTORY: Patient is a 75 year old female actively receiving treatment with carboplatinum and Keytruda for the above-stated malignancy.  She last received treatment on March 11, 2021.  She recently had worsening shortness of breath and cough and found to be COVID-positive.  Her husband is also at home under quarantine for COVID positivity.  She otherwise feels well.  She has no neurologic complaints.  She denies any fevers.  She has good appetite and denies weight loss.  She has no chest pain or hemoptysis.  She has no nausea, vomiting, constipation, or diarrhea.  She has no urinary complaints.  Patient offers no further specific complaints today.  REVIEW OF SYSTEMS:   Review of Systems  Constitutional: Negative.  Negative for fever, malaise/fatigue and weight loss.  Respiratory:  Positive for cough and shortness of breath. Negative for hemoptysis.   Cardiovascular: Negative.  Negative for chest pain and leg swelling.  Gastrointestinal: Negative.  Negative for abdominal pain.  Genitourinary: Negative.  Negative for dysuria.  Musculoskeletal: Negative.  Negative for back pain.  Skin: Negative.  Negative for rash.  Neurological: Negative.  Negative for dizziness, focal weakness, weakness and headaches.  Psychiatric/Behavioral: Negative.  The patient is not nervous/anxious.    As per HPI. Otherwise, a  complete review of systems is negative.  PAST MEDICAL HISTORY: Past Medical History:  Diagnosis Date   CKD (chronic kidney disease), stage IV (HCC)    Colon polyps    COPD (chronic obstructive pulmonary disease) ([place])    Diabetes mellitus without complication (HCC)    GERD (gastroesophageal reflux disease)    Headache    Hepatitis    when in 4th or 5th grade.  Resolved   Hyperlipidemia    IBS (irritable bowel syndrome)    Peptic ulcer    Tobacco abuse    Vertigo    No "bad" episodes in over 1 year   Wears dentures    partial ujpper and lower    PAST SURGICAL HISTORY: Past Surgical History:  Procedure Laterality Date   ABDOMINAL HYSTERECTOMY     carbuncle removal     urethra   CATARACT EXTRACTION W/PHACO Left 10/29/2020   Procedure: CATARACT EXTRACTION PHACO AND INTRAOCULAR LENS PLACEMENT (IOC) LEFT DIABETIC 1051 01:00.0 ;  Surgeon: Eulogio Bear, MD;  Location: [place];  Service: Ophthalmology;  Laterality: Left;  Diabetic - insulin   CATARACT EXTRACTION W/PHACO Right 11/19/2020   Procedure: CATARACT EXTRACTION PHACO AND INTRAOCULAR LENS PLACEMENT (IOC) RIGHT DIABETIC;  Surgeon: Eulogio Bear, MD;  Location: [place];  Service: Ophthalmology;  Laterality: Right;  16.54 1:40.0   COLONOSCOPY     COLONOSCOPY WITH PROPOFOL N/A 11/05/2017   Procedure: COLONOSCOPY WITH PROPOFOL;  Surgeon: Lollie Sails, MD;  Location: [facility] ENDOSCOPY;  Service: Endoscopy;  Laterality: N/A;   ESOPHAGOGASTRODUODENOSCOPY     ESOPHAGOGASTRODUODENOSCOPY (EGD) WITH PROPOFOL N/A 11/05/2017   Procedure: ESOPHAGOGASTRODUODENOSCOPY (EGD) WITH PROPOFOL;  Surgeon: Lollie Sails, MD;  Location: [facility] ENDOSCOPY;  Service: Endoscopy;  Laterality: N/A;  IR IMAGING GUIDED PORT INSERTION  12/13/2020   IR IMAGING GUIDED PORT INSERTION  01/25/2021   VIDEO BRONCHOSCOPY WITH ENDOBRONCHIAL NAVIGATION N/A 12/02/2019   Procedure: VIDEO BRONCHOSCOPY WITH ENDOBRONCHIAL NAVIGATION;   Surgeon: Ottie Glazier, MD;  Location: [facility]S;  Service: Thoracic;  Laterality: N/A;   VIDEO BRONCHOSCOPY WITH ENDOBRONCHIAL ULTRASOUND N/A 12/02/2019   Procedure: VIDEO BRONCHOSCOPY WITH ENDOBRONCHIAL ULTRASOUND;  Surgeon: Ottie Glazier, MD;  Location: [facility] ORS;  Service: Thoracic;  Laterality: N/A;    FAMILY HISTORY: Family History  Problem Relation Age of Onset   Breast cancer Neg Hx     ADVANCED DIRECTIVES (Y/N):  @ADVDIR @  HEALTH MAINTENANCE: Social History   Tobacco Use   Smoking status: Every Day    Packs/day: 0.25    Years: 45.00    Pack years: 11.25    Types: Cigarettes    Last attempt to quit: 08/06/2020    Years since quitting: 0.6   Smokeless tobacco: Never  Vaping Use   Vaping Use: Never used  Substance Use Topics   Alcohol use: No   Drug use: No     Colonoscopy:  PAP:  Bone density:  Lipid panel:  Allergies  Allergen Reactions   Penicillins Anaphylaxis and Rash    Did it involve swelling of the face/tongue/throat, SOB, or low BP? Yes Did it involve sudden or severe rash/hives, skin peeling, or any reaction on the inside of your mouth or nose? Yes Did you need to seek medical attention at a hospital or doctor's office? Yes When did it last happen?More than 40 years ago If all above answers are "NO", may proceed with cephalosporin use.    Metformin Other (See Comments)    Upset stomach   Sulfa Antibiotics Nausea And Vomiting    Current Facility-Administered Medications  Medication Dose Route Frequency Provider Last Rate Last Admin   acetaminophen (TYLENOL) tablet 650 mg  650 mg Oral Q6H PRN Agbata, Tochukwu, MD       Or   acetaminophen (TYLENOL) suppository 650 mg  650 mg Rectal Q6H PRN Agbata, Tochukwu, MD       albuterol (VENTOLIN HFA) 108 (90 Base) MCG/ACT inhaler 1-2 puff  1-2 puff Inhalation Q6H PRN Agbata, Tochukwu, MD       ascorbic acid (VITAMIN C) tablet 500 mg  500 mg Oral Daily Agbata, Tochukwu, MD   500 mg at 03/17/21 0913    azithromycin (ZITHROMAX) 500 mg in sodium chloride 0.9 % 250 mL IVPB  500 mg Intravenous Q24H Agbata, Tochukwu, MD   Stopped at 04/03/2021 1817   cefTRIAXone (ROCEPHIN) 2 g in sodium chloride 0.9 % 100 mL IVPB  2 g Intravenous Q24H Agbata, Tochukwu, MD   Stopped at 03/17/2021 1533   chlorpheniramine-HYDROcodone (TUSSIONEX) 10-8 MG/5ML suspension 5 mL  5 mL Oral QHS PRN Agbata, Tochukwu, MD   5 mL at 03/17/2021 2200   enoxaparin (LOVENOX) injection 40 mg  40 mg Subcutaneous Q24H Chappell, Alex B, RPH       ezetimibe (ZETIA) tablet 10 mg  10 mg Oral Daily Agbata, Tochukwu, MD   10 mg at 32/95/18 8416   folic acid (FOLVITE) tablet 1 mg  1 mg Oral Daily Agbata, Tochukwu, MD   1 mg at 03/17/21 0913   hyoscyamine (LEVBID) 0.375 MG 12 hr tablet 0.375 mg  0.375 mg Oral Daily PRN Agbata, Tochukwu, MD       insulin aspart (novoLOG) injection 0-9 Units  0-9 Units Subcutaneous TID WC Agbata, Tochukwu, MD   3  Units at 03/17/21 0734   insulin detemir (LEVEMIR) injection 4 Units  0.075 Units/kg Subcutaneous BID Agbata, Tochukwu, MD   4 Units at 03/17/21 0917   lidocaine-prilocaine (EMLA) cream 1 application  1 application Topical Once Agbata, Tochukwu, MD       megestrol (MEGACE) 400 MG/10ML suspension 625 mg  625 mg Oral Daily Jennye Boroughs, MD   625 mg at 03/17/21 1109   methylPREDNISolone sodium succinate (SOLU-MEDROL) 125 mg/2 mL injection 60 mg  60 mg Intravenous Q12H Agbata, Tochukwu, MD   60 mg at 03/17/21 0559   Followed by   Derrill Memo ON 03/20/2021] predniSONE (DELTASONE) tablet 50 mg  50 mg Oral Daily Agbata, Tochukwu, MD       mometasone-formoterol (DULERA) 200-5 MCG/ACT inhaler 2 puff  2 puff Inhalation BID Agbata, Tochukwu, MD       multivitamin with minerals tablet 1 tablet  1 tablet Oral Daily Agbata, Tochukwu, MD   1 tablet at 03/17/21 0913   ondansetron (ZOFRAN) tablet 4 mg  4 mg Oral Q6H PRN Agbata, Tochukwu, MD       Or   ondansetron (ZOFRAN) injection 4 mg  4 mg Intravenous Q6H PRN Agbata, Tochukwu, MD        pantoprazole (PROTONIX) EC tablet 40 mg  40 mg Oral Daily Agbata, Tochukwu, MD   40 mg at 03/17/21 3435   remdesivir 100 mg in sodium chloride 0.9 % 100 mL IVPB  100 mg Intravenous Daily Agbata, Tochukwu, MD   Stopped at 03/17/21 0956   rosuvastatin (CRESTOR) tablet 40 mg  40 mg Oral QPM Agbata, Tochukwu, MD   40 mg at 03/26/2021 1812   vitamin B-12 (CYANOCOBALAMIN) tablet 1,000 mcg  1,000 mcg Oral Daily Agbata, Tochukwu, MD   1,000 mcg at 03/17/21 0913   zinc sulfate capsule 220 mg  220 mg Oral Daily Agbata, Tochukwu, MD   220 mg at 03/17/21 6861   Current Outpatient Medications  Medication Sig Dispense Refill   acetaminophen (TYLENOL) 500 MG tablet Take 1,000 mg by mouth every 6 (six) hours as needed for mild pain or moderate pain.     albuterol (VENTOLIN HFA) 108 (90 Base) MCG/ACT inhaler Inhale 1-2 puffs into the lungs every 6 (six) hours as needed for wheezing or shortness of breath. 8 g 1   chlorpheniramine-HYDROcodone (TUSSIONEX) 10-8 MG/5ML SUER Take 5 mLs by mouth at bedtime as needed for cough. (Patient taking differently: Take 5 mLs by mouth 3 (three) times daily as needed for cough.) 200 mL 0   diazepam (VALIUM) 5 MG tablet Take 5 mg by mouth daily as needed (for vertigo).     ezetimibe (ZETIA) 10 MG tablet Take 10 mg by mouth daily.      Fluticasone-Salmeterol (ADVAIR) 250-50 MCG/DOSE AEPB Inhale 1 puff into the lungs 2 (two) times daily.     folic acid (FOLVITE) 1 MG tablet Take 1 tablet (1 mg total) by mouth daily. 90 tablet 1   hyoscyamine (LEVBID) 0.375 MG 12 hr tablet Take 0.375 mg by mouth daily as needed (abdominal cramping).      Ipratropium-Albuterol (COMBIVENT) 20-100 MCG/ACT AERS respimat Inhale 1 puff into the lungs every 4 (four) hours as needed for wheezing.     ipratropium-albuterol (DUONEB) 0.5-2.5 (3) MG/3ML SOLN Take 3 mLs by nebulization every 6 (six) hours as needed (wheezing). 360 mL 0   LANTUS SOLOSTAR 100 UNIT/ML Solostar Pen Inject 12-20 Units into the skin  See admin instructions. Inject 12u under the skin every morning  and inject 20u under the skin at bedtime     lidocaine-prilocaine (EMLA) cream Apply 1 application topically as needed. 30 g 3   megestrol (MEGACE ES) 625 MG/5ML suspension Take 5 mLs (625 mg total) by mouth daily. 150 mL 0   Multiple Vitamin (MULTIVITAMIN WITH MINERALS) TABS tablet Take 1 tablet by mouth daily. One-A-Day for Women     nystatin (MYCOSTATIN) 100000 UNIT/ML suspension Take 5 mLs by mouth 2 (two) times daily as needed (throat irritation.).     omeprazole (PRILOSEC) 20 MG capsule Take 20 mg by mouth daily before breakfast.      ondansetron (ZOFRAN) 8 MG tablet Take 1 tablet (8 mg total) by mouth every 8 (eight) hours as needed for nausea or vomiting. 20 tablet 3   prochlorperazine (COMPAZINE) 10 MG tablet Take 1 tablet (10 mg total) by mouth every 6 (six) hours as needed for nausea or vomiting. 30 tablet 3   rosuvastatin (CRESTOR) 40 MG tablet Take 40 mg by mouth every evening.      vitamin B-12 (CYANOCOBALAMIN) 1000 MCG tablet Take 1,000 mcg by mouth daily.     Facility-Administered Medications Ordered in Other Encounters  Medication Dose Route Frequency Provider Last Rate Last Admin   sodium chloride flush (NS) 0.9 % injection 10 mL  10 mL Intravenous PRN Charlaine Dalton R, MD   10 mL at 12/24/20 0842   sodium chloride flush (NS) 0.9 % injection 10 mL  10 mL Intravenous PRN Verlon Au, NP   10 mL at 12/28/20 1415    OBJECTIVE: Vitals:   03/17/21 0900 03/17/21 0930  BP: (!) 119/54   Pulse: 93 93  Resp: 18 20  Temp:    SpO2: 99% 98%     Body mass index is 23.24 kg/m.    ECOG FS:1 - Symptomatic but completely ambulatory  General: Well-developed, well-nourished, no acute distress. Eyes: Pink conjunctiva, anicteric sclera. HEENT: Normocephalic, moist mucous membranes. Lungs: Patient coughing intermittently.  No audible wheezing.   Heart: Regular rate and rhythm. Abdomen: Soft, nontender, no obvious  distention. Musculoskeletal: No edema, cyanosis, or clubbing. Neuro: Alert, answering all questions appropriately. Cranial nerves grossly intact. Skin: No rashes or petechiae noted. Psych: Normal affect.   LAB RESULTS:  Lab Results  Component Value Date   NA 137 03/17/2021   K 3.6 03/17/2021   CL 111 03/17/2021   CO2 20 (L) 03/17/2021   GLUCOSE 224 (H) 03/17/2021   BUN 28 (H) 03/17/2021   CREATININE 1.07 (H) 03/17/2021   CALCIUM 7.5 (L) 03/17/2021   PROT 6.0 (L) 03/17/2021   ALBUMIN 1.7 (L) 03/17/2021   AST 26 03/17/2021   ALT 14 03/17/2021   ALKPHOS 72 03/17/2021   BILITOT 0.4 03/17/2021   GFRNONAA 54 (L) 03/17/2021   GFRAA 60 (L) 05/30/2019    Lab Results  Component Value Date   WBC 3.7 (L) 03/17/2021   NEUTROABS 3.1 03/17/2021   HGB 7.6 (L) 03/17/2021   HCT 23.2 (L) 03/17/2021   MCV 103.1 (H) 03/17/2021   PLT 127 (L) 03/17/2021     STUDIES: CT Angio Chest PE W and/or Wo Contrast  Result Date: 03/27/2021 CLINICAL DATA:  Weakness. Cough and shortness of breath. COVID positive. History of COPD. EXAM: CT ANGIOGRAPHY CHEST WITH CONTRAST TECHNIQUE: Multidetector CT imaging of the chest was performed using the standard protocol during bolus administration of intravenous contrast. Multiplanar CT image reconstructions and MIPs were obtained to evaluate the vascular anatomy. CONTRAST:  6mL OMNIPAQUE IOHEXOL 350  MG/ML SOLN COMPARISON:  Plain film of earlier today.  Chest CT of 02/19/2021. FINDINGS: Cardiovascular: The quality of this exam for evaluation of pulmonary embolism is good. No evidence of pulmonary embolism. Right Port-A-Cath tip low SVC. Aortic atherosclerosis. Normal heart size, without pericardial effusion. Three vessel coronary artery calcification. Mediastinum/Nodes: No supraclavicular adenopathy. Index precarinal 7 mm node on 36/4 is similar to 8 mm on the prior. A node within the subcarinal station with extension into the azygoesophageal recess measures 1.4 cm on  44/4 versus 7 mm on the prior exam (when remeasured). Left perihilar soft tissue thickening which is likely radiation induced. No well-defined adenopathy. Lungs/Pleura: Trace left pleural fluid. Left endobronchial compression is felt to be similar to on the prior. Worsening left lower lobe aeration with progressive airspace and ground-glass opacity. Development of bilateral, right greater than left peripheral predominant ground-glass. Similar dependent lingular consolidation. Lateral left lower lobe 7 mm lung nodule is unchanged on 52/6. The more central anterior left lower lobe nodule is likely obscured by airspace disease. Upper Abdomen: Right hepatic lobe treated metastasis is not well evaluated secondary to bolus timing. High left hepatic lobe cysts. Normal imaged portions of the spleen, stomach, pancreas, gallbladder, kidneys, adrenal glands. Abdominal aortic and branch vessel atherosclerosis. Musculoskeletal: No acute osseous abnormality. Review of the MIP images confirms the above findings. IMPRESSION: 1.  No evidence of pulmonary embolism. 2. Development of multifocal peripheral predominant ground-glass opacities, highly suspicious for COVID-19 pneumonia. 3. Left lower lobe pulmonary opacities are slightly progressive and likely represent a combination of COVID-19 pneumonia and chronic infection or aspiration. 4. Similar appearance of probable radiation induced consolidation within the lingula and anterior left lower lobe. Anterior left lower lobe nodularity is similar. 5. Subcarinal adenopathy, mild and new since 02/18/2021. Most likely reactive. Metastatic disease could look similar. Recommend attention on follow-up. 6. Coronary artery atherosclerosis. Aortic Atherosclerosis (ICD10-I70.0). 7. Right hepatic lobe metastasis, suboptimally evaluated secondary to bolus timing. Electronically Signed   By: Abigail Miyamoto M.D.   On: 03/09/2021 14:56   DG SWALLOW FUNC OP MEDICARE SPEECH PATH  Result Date:  03/01/2021 Objective Swallowing Evaluation: Type of Study: MBS-Modified Barium Swallow Study  Patient Details Name: KAMARRI FISCHETTI MRN: [id number] Date of Birth: 16-Jun-1946 Today's Date: 03/01/2021 Time: SLP Start Time (ACUTE ONLY): 1330 -SLP Stop Time (ACUTE ONLY): 1400 SLP Time Calculation (min) (ACUTE ONLY): 30 min Past Medical History: Past Medical History: Diagnosis Date  CKD (chronic kidney disease), stage IV (HCC)   Colon polyps   COPD (chronic obstructive pulmonary disease) ([place])   Diabetes mellitus without complication (HCC)   GERD (gastroesophageal reflux disease)   Headache   Hepatitis   when in 4th or 5th grade.  Resolved  Hyperlipidemia   IBS (irritable bowel syndrome)   Peptic ulcer   Tobacco abuse   Vertigo   No "bad" episodes in over 1 year  Wears dentures   partial ujpper and lower Past Surgical History: Past Surgical History: Procedure Laterality Date  ABDOMINAL HYSTERECTOMY    carbuncle removal    urethra  CATARACT EXTRACTION W/PHACO Left 10/29/2020  Procedure: CATARACT EXTRACTION PHACO AND INTRAOCULAR LENS PLACEMENT (IOC) LEFT DIABETIC 1051 01:00.0 ;  Surgeon: Eulogio Bear, MD;  Location: [place];  Service: Ophthalmology;  Laterality: Left;  Diabetic - insulin  CATARACT EXTRACTION W/PHACO Right 11/19/2020  Procedure: CATARACT EXTRACTION PHACO AND INTRAOCULAR LENS PLACEMENT (IOC) RIGHT DIABETIC;  Surgeon: Eulogio Bear, MD;  Location: [place];  Service: Ophthalmology;  Laterality:  Right;  16.54 1:40.0  COLONOSCOPY    COLONOSCOPY WITH PROPOFOL N/A 11/05/2017  Procedure: COLONOSCOPY WITH PROPOFOL;  Surgeon: Lollie Sails, MD;  Location[facility] ENDOSCOPY;  Service: Endoscopy;  Laterality: N/A;  ESOPHAGOGASTRODUODENOSCOPY    ESOPHAGOGASTRODUODENOSCOPY (EGD) WITH PROPOFOL N/A 11/05/2017  Procedure: ESOPHAGOGASTRODUODENOSCOPY (EGD) WITH PROPOFOL;  Surgeon: Lollie Sails, MD;  Location: [facility] ENDOSCOPY;  Service: Endoscopy;  Laterality: N/A;  IR IMAGING GUIDED PORT  INSERTION  12/13/2020  IR IMAGING GUIDED PORT INSERTION  01/25/2021  VIDEO BRONCHOSCOPY WITH ENDOBRONCHIAL NAVIGATION N/A 12/02/2019  Procedure: VIDEO BRONCHOSCOPY WITH ENDOBRONCHIAL NAVIGATION;  Surgeon: Ottie Glazier, MD;  Location: [facility];  Service: Thoracic;  Laterality: N/A;  VIDEO BRONCHOSCOPY WITH ENDOBRONCHIAL ULTRASOUND N/A 12/02/2019  Procedure: VIDEO BRONCHOSCOPY WITH ENDOBRONCHIAL ULTRASOUND;  Surgeon: Ottie Glazier, MD;  Location: [facility];  Service: Thoracic;  Laterality: N/A; HPI: Pt is a 75 year old female who was referred by Dr Rogue Bussing d/t difficulty swallowing. Pt is currently undergoing treatment adenocarcinoma-s/p liver biopsy (?  Recurrent lung cancer versus cholangiocarcinoma) thought clinically lung cancer status post carbo-Keytruda.  Subjective: pt pleasant, good historian Assessment / Plan / Recommendation CHL IP CLINICAL IMPRESSIONS 03/01/2021 Clinical Impression Pt demonstrates adequate oropharyngeal abilities when consuming nectar thick liquids, thin liquids, puree, graham crackers and whole barium tablet. Penetration and aspiration were not observed during the study. Behaviorally, pt has sensation to effortfully swallow "to get it to go down." Pt does have indentation of the posterior pharyngeal wall from anterior cervical spine osteophytes and prominent cricopharyngeus but bolus was not impeded. Pt does endorse strong family history of esophageal strictures. MD may wish to consider an eosphageal assessment if pt is interested. SLP Visit Diagnosis Dysphagia, unspecified (R13.10) Attention and concentration deficit following -- Frontal lobe and executive function deficit following -- Impact on safety and function No limitations   CHL IP TREATMENT RECOMMENDATION 03/01/2021 Treatment Recommendations No treatment recommended at this time   No flowsheet data found. CHL IP DIET RECOMMENDATION 03/01/2021 SLP Diet Recommendations Regular solids;Thin liquid Liquid Administration via Cup;Straw  Medication Administration Whole meds with liquid Compensations Minimize environmental distractions;Slow rate;Small sips/bites Postural Changes Remain semi-upright after after feeds/meals (Comment);Seated upright at 90 degrees   CHL IP OTHER RECOMMENDATIONS 03/01/2021 Recommended Consults Consider esophageal assessment Oral Care Recommendations Oral care BID Other Recommendations --   CHL IP FOLLOW UP RECOMMENDATIONS 03/01/2021 Follow up Recommendations None   No flowsheet data found.     CHL IP ORAL PHASE 03/01/2021 Oral Phase WFL Oral - Pudding Teaspoon -- Oral - Pudding Cup -- Oral - Honey Teaspoon -- Oral - Honey Cup -- Oral - Nectar Teaspoon -- Oral - Nectar Cup -- Oral - Nectar Straw -- Oral - Thin Teaspoon -- Oral - Thin Cup -- Oral - Thin Straw -- Oral - Puree -- Oral - Mech Soft -- Oral - Regular -- Oral - Multi-Consistency -- Oral - Pill -- Oral Phase - Comment --  CHL IP PHARYNGEAL PHASE 03/01/2021 Pharyngeal Phase WFL Pharyngeal- Pudding Teaspoon -- Pharyngeal -- Pharyngeal- Pudding Cup -- Pharyngeal -- Pharyngeal- Honey Teaspoon -- Pharyngeal -- Pharyngeal- Honey Cup -- Pharyngeal -- Pharyngeal- Nectar Teaspoon -- Pharyngeal -- Pharyngeal- Nectar Cup -- Pharyngeal -- Pharyngeal- Nectar Straw -- Pharyngeal -- Pharyngeal- Thin Teaspoon -- Pharyngeal -- Pharyngeal- Thin Cup -- Pharyngeal -- Pharyngeal- Thin Straw -- Pharyngeal -- Pharyngeal- Puree -- Pharyngeal -- Pharyngeal- Mechanical Soft -- Pharyngeal -- Pharyngeal- Regular -- Pharyngeal -- Pharyngeal- Multi-consistency -- Pharyngeal -- Pharyngeal- Pill -- Pharyngeal -- Pharyngeal Comment --  CHL IP CERVICAL ESOPHAGEAL PHASE 03/01/2021 Cervical Esophageal Phase (No Data) Pudding Teaspoon -- Pudding Cup -- Honey Teaspoon -- Honey Cup -- Nectar Teaspoon -- Nectar Cup -- Nectar Straw -- Thin Teaspoon -- Thin Cup -- Thin Straw -- Puree -- Mechanical Soft -- Regular -- Multi-consistency -- Pill -- Cervical Esophageal Comment -- Happi B. Rutherford Nail M.S., CCC-SLP,  [place] Office [phone] Happi Rutherford Nail 03/01/2021, 2:42 PM            CLINICAL DATA:  Possible aspiration EXAM: MODIFIED BARIUM SWALLOW TECHNIQUE: Different consistencies of barium were administered orally to the patient by the Speech Pathologist. Imaging of the pharynx was performed in the lateral projection. The radiologist was present in the fluoroscopy room for this study, providing personal supervision. FLUOROSCOPY TIME:  Fluoroscopy Time:  1 minutes 36 seconds Radiation Exposure Index (if provided by the fluoroscopic device): 3.5 mGy COMPARISON:  None. FINDINGS: No aspiration with any attempted consistency. There is indentation of the posterior pharyngeal wall from anterior cervical spine osteophytes and prominent cricopharyngeus without holdup of contrast. IMPRESSION: No aspiration. Please refer to the Speech Pathologists report for complete details and recommendations. Electronically Signed   By: Macy Mis M.D.   On: 03/01/2021 13:53   CT CHEST ABDOMEN PELVIS W CONTRAST  Result Date: 02/19/2021 CLINICAL DATA:  Metastatic left lower lobe non-small cell lung cancer, status post chemotherapy and radiation EXAM: CT CHEST, ABDOMEN, AND PELVIS WITH CONTRAST TECHNIQUE: Multidetector CT imaging of the chest, abdomen and pelvis was performed following the standard protocol during bolus administration of intravenous contrast. CONTRAST:  22mL OMNIPAQUE IOHEXOL 300 MG/ML SOLN, additional oral enteric contrast COMPARISON:  PET-CT, 11/21/2020, CT chest, 11/06/2020 FINDINGS: CT CHEST FINDINGS Cardiovascular: Right chest port catheter. Normal heart size. Left and right coronary artery calcifications no pericardial effusion. Mediastinum/Nodes: Interval decrease in size of a high left paratracheal/superior mediastinal lymph node, previously FDG PET avid, measuring 0.7 x 0.6 cm, previously 1.0 x 0.8 cm (series 2, image 7). Interval decrease in size of enlarged,  previously FDG PET avid pretracheal lymph node, measuring 0.8 x 0.8 cm, previously 1.1 x 1.1 cm (series 2, image 22). Thyroid gland, trachea, and esophagus demonstrate no significant findings. Lungs/Pleura: Unchanged, bandlike post treatment fibrosis of the perihilar left lung and lingula (series 3, image 61, 70). Unchanged nodules in the anterior left lower lobe measuring 1.3 x 0.6 cm and 0.7 x 0.5 cm (series 3, image 74, 70). Extensive bronchial plugging in the dependent left lung, with new heterogeneous and ground-glass airspace opacity and centrilobular in the left lower lobe (series 3, image 89, 98). Unchanged 3 mm fissural nodule of the anterior right lower lobe (series 3, image 77). Background of very fine centrilobular pulmonary nodularity. Musculoskeletal: No chest wall mass or suspicious bone lesions identified. CT ABDOMEN PELVIS FINDINGS Hepatobiliary: Slight interval decrease in size of a mass of the peripheral right lobe of the liver, hepatic segment VI, measuring 3.5 x 3.5 cm, previously 4.2 x 4.0 cm (series 2, image 53). Hepatic steatosis. No gallstones, gallbladder wall thickening, or biliary dilatation. Pancreas: Unremarkable. No pancreatic ductal dilatation or surrounding inflammatory changes. Spleen: Normal in size without significant abnormality. Adrenals/Urinary Tract: Adrenal glands are unremarkable. Kidneys are normal, without renal calculi, solid lesion, or hydronephrosis. Bladder is unremarkable. Stomach/Bowel: Stomach is within normal limits. Appendix appears normal. No evidence of bowel wall thickening, distention, or inflammatory changes. Vascular/Lymphatic: Aortic atherosclerosis. No enlarged abdominal or pelvic lymph nodes. Reproductive: Status post hysterectomy. Other: Right inguinal hernia  containing predominantly fat and a small portion of the bladder dome (series 2, image 113). No abdominopelvic ascites. Musculoskeletal: No acute or significant osseous findings. IMPRESSION: 1.  Interval decrease in size of previously FDG avid mediastinal lymph nodes, consistent with treatment response of nodal metastatic disease. 2. Slight interval decrease in size of a mass of the peripheral right lobe of the liver, consistent with treatment response of hepatic metastatic disease. 3. Unchanged, bandlike post treatment fibrosis of the perihilar left lung and lingula as well as nodules of the adjacent anterior left lower lobe. 4. Extensive bronchial plugging in the dependent left lung, with new heterogeneous and ground-glass airspace opacity and centrilobular in the left lower lobe. Findings are most consistent with aspiration. 5. Background of very fine centrilobular pulmonary nodularity, consistent with smoking-related respiratory bronchiolitis. 6. Coronary artery disease. Aortic Atherosclerosis (ICD10-I70.0). Electronically Signed   By: Eddie Candle M.D.   On: 02/19/2021 16:22   DG Chest Portable 1 View  Result Date: 03/29/2021 CLINICAL DATA:  Shortness of breath with productive cough and weakness. COVID-19 positive this morning. EXAM: PORTABLE CHEST 1 VIEW COMPARISON:  12/28/2020 and chest CT 02/18/2021 FINDINGS: Right IJ Port-A-Cath unchanged. Lungs are adequately inflated demonstrate patchy density over the left mid to lower lung which may be slightly worse in the left base. Extensive bilateral costochondral calcification. Cardiomediastinal silhouette and remainder of the exam is unchanged. IMPRESSION: Chronic patchy density over the left mid to lower lung. This may be slightly worse over the left base which may be due to atelectasis or early superimposed infection. Electronically Signed   By: Marin Olp M.D.   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  PLAN:    1.  Stage IV poorly differentiated adenocarcinoma: Patient last received treatment with carboplatinum and Keytruda on March 11, 2021.  Her next treatment is not scheduled for at least 2 weeks.  This will likely be delayed, but will defer final decision to patient's primary oncologist, Dr. Rogue Bussing.  No intervention is needed from an oncology standpoint at this time.  Will arrange appropriate follow-up upon discharge. 2.  COVID pneumonia: Patient initiated on remdesivir.  Continue to treat symptomatically. 3.  Leukopenia: Mild, secondary to chemotherapy.  Monitor. 4.  Anemia: Patient's hemoglobin decreased to 7.6.  Patient initially was scheduled for blood transfusion on March 21, 2021 in the cancer center.  Okay to proceed with 1 unit packed red blood cells if hemoglobin continues to decline. 5.  Thrombocytopenia: Secondary to chemotherapy, monitor.  Appreciate consult, will follow.   Lloyd Huger, MD   03/17/2021 1:41 PM

## 2021-03-18 ENCOUNTER — Encounter: Payer: Self-pay | Admitting: Internal Medicine

## 2021-03-18 DIAGNOSIS — A419 Sepsis, unspecified organism: Secondary | ICD-10-CM | POA: Diagnosis not present

## 2021-03-18 DIAGNOSIS — U071 COVID-19: Secondary | ICD-10-CM | POA: Diagnosis not present

## 2021-03-18 DIAGNOSIS — R6520 Severe sepsis without septic shock: Secondary | ICD-10-CM | POA: Diagnosis not present

## 2021-03-18 DIAGNOSIS — J441 Chronic obstructive pulmonary disease with (acute) exacerbation: Secondary | ICD-10-CM | POA: Diagnosis not present

## 2021-03-18 LAB — GLUCOSE, CAPILLARY
Glucose-Capillary: 272 mg/dL — ABNORMAL HIGH (ref 70–99)
Glucose-Capillary: 287 mg/dL — ABNORMAL HIGH (ref 70–99)
Glucose-Capillary: 345 mg/dL — ABNORMAL HIGH (ref 70–99)
Glucose-Capillary: 332 mg/dL — ABNORMAL HIGH (ref 70–99)

## 2021-03-18 LAB — COMPREHENSIVE METABOLIC PANEL
ALT: 14 U/L (ref 0–44)
AST: 26 U/L (ref 15–41)
Albumin: 1.6 g/dL — ABNORMAL LOW (ref 3.5–5.0)
Alkaline Phosphatase: 67 U/L (ref 38–126)
Anion gap: 6 (ref 5–15)
BUN: 29 mg/dL — ABNORMAL HIGH (ref 8–23)
CO2: 21 mmol/L — ABNORMAL LOW (ref 22–32)
Calcium: 7.6 mg/dL — ABNORMAL LOW (ref 8.9–10.3)
Chloride: 110 mmol/L (ref 98–111)
Creatinine, Ser: 1.04 mg/dL — ABNORMAL HIGH (ref 0.44–1.00)
GFR, Estimated: 56 mL/min — ABNORMAL LOW (ref >60)
Glucose, Bld: 297 mg/dL — ABNORMAL HIGH (ref 70–99)
Potassium: 3.2 mmol/L — ABNORMAL LOW (ref 3.5–5.1)
Sodium: 137 mmol/L (ref 135–145)
Total Bilirubin: 0.4 mg/dL (ref 0.3–1.2)
Total Protein: 5.9 g/dL — ABNORMAL LOW (ref 6.5–8.1)

## 2021-03-18 LAB — CBC WITH DIFFERENTIAL/PLATELET
Abs Immature Granulocytes: 0.27 10*3/uL — ABNORMAL HIGH (ref 0.00–0.07)
Basophils Absolute: 0 10*3/uL (ref 0.0–0.1)
Basophils Relative: 1 %
Eosinophils Absolute: 0 10*3/uL (ref 0.0–0.5)
Eosinophils Relative: 0 %
HCT: 23 % — ABNORMAL LOW (ref 36.0–46.0)
Hemoglobin: 7.6 g/dL — ABNORMAL LOW (ref 12.0–15.0)
Immature Granulocytes: 9 %
Lymphocytes Relative: 4 %
Lymphs Abs: 0.1 10*3/uL — ABNORMAL LOW (ref 0.7–4.0)
MCH: 33.5 pg (ref 26.0–34.0)
MCHC: 33 g/dL (ref 30.0–36.0)
MCV: 101.3 fL — ABNORMAL HIGH (ref 80.0–100.0)
Monocytes Absolute: 0.3 10*3/uL (ref 0.1–1.0)
Monocytes Relative: 11 %
Neutro Abs: 2.3 10*3/uL (ref 1.7–7.7)
Neutrophils Relative %: 75 %
Platelets: 142 10*3/uL — ABNORMAL LOW (ref 150–400)
RBC: 2.27 MIL/uL — ABNORMAL LOW (ref 3.87–5.11)
RDW: 18.4 % — ABNORMAL HIGH (ref 11.5–15.5)
Smear Review: NORMAL
WBC: 3 10*3/uL — ABNORMAL LOW (ref 4.0–10.5)
nRBC: 0 % (ref 0.0–0.2)

## 2021-03-18 LAB — PHOSPHORUS: Phosphorus: 3 mg/dL (ref 2.5–4.6)

## 2021-03-18 LAB — C-REACTIVE PROTEIN: CRP: 12.5 mg/dL — ABNORMAL HIGH (ref <1.0)

## 2021-03-18 LAB — PROCALCITONIN: Procalcitonin: 0.49 ng/mL

## 2021-03-18 LAB — FERRITIN: Ferritin: 1120 ng/mL — ABNORMAL HIGH (ref 11–307)

## 2021-03-18 LAB — MAGNESIUM: Magnesium: 1.5 mg/dL — ABNORMAL LOW (ref 1.7–2.4)

## 2021-03-18 LAB — D-DIMER, QUANTITATIVE: D-Dimer, Quant: 3.31 ug/mL-FEU — ABNORMAL HIGH (ref 0.00–0.50)

## 2021-03-18 MED ORDER — MAGNESIUM SULFATE 2 GM/50ML IV SOLN
2.0000 g | Freq: Once | INTRAVENOUS | Status: AC
  Administered 2021-03-18: 2 g via INTRAVENOUS
  Filled 2021-03-18: qty 50

## 2021-03-18 MED ORDER — ENSURE ENLIVE PO LIQD: 237.0000 mL | Freq: Every day | ORAL | Status: DC | PRN

## 2021-03-18 MED ORDER — KATE FARMS STANDARD 1.4 PO LIQD
325.0000 mL | Freq: Every day | ORAL | Status: DC
  Administered 2021-03-18 – 2021-03-19 (×2): 325 mL via ORAL
  Filled 2021-03-18: qty 325

## 2021-03-18 MED ORDER — ENSURE ENLIVE PO LIQD: 237.0000 mL | ORAL | Status: DC

## 2021-03-18 MED ORDER — POTASSIUM CHLORIDE CRYS ER 20 MEQ PO TBCR
40.0000 meq | EXTENDED_RELEASE_TABLET | Freq: Once | ORAL | Status: AC
  Administered 2021-03-18: 40 meq via ORAL
  Filled 2021-03-18: qty 2

## 2021-03-18 MED ORDER — HYDROCOD POLST-CPM POLST ER 10-8 MG/5ML PO SUER
5.0000 mL | Freq: Two times a day (BID) | ORAL | Status: DC | PRN
  Administered 2021-03-18 – 2021-03-19 (×3): 5 mL via ORAL
  Filled 2021-03-18 (×3): qty 5

## 2021-03-18 MED ORDER — INSULIN ASPART 100 UNIT/ML IJ SOLN
0.0000 [IU] | Freq: Every day | INTRAMUSCULAR | Status: DC
  Administered 2021-03-18: 4 [IU] via SUBCUTANEOUS
  Filled 2021-03-18: qty 1

## 2021-03-18 MED ORDER — SODIUM CHLORIDE 0.9 % IV SOLN
500.0000 mg | INTRAVENOUS | Status: AC
  Administered 2021-03-18: 500 mg via INTRAVENOUS
  Filled 2021-03-18: qty 500

## 2021-03-18 MED ORDER — GUAIFENESIN-DM 100-10 MG/5ML PO SYRP
5.0000 mL | ORAL_SOLUTION | ORAL | Status: DC | PRN
  Administered 2021-03-18 – 2021-03-19 (×3): 5 mL via ORAL
  Filled 2021-03-18 (×4): qty 5

## 2021-03-18 MED ORDER — PROSOURCE PLUS PO LIQD
30.0000 mL | Freq: Two times a day (BID) | ORAL | Status: DC
  Administered 2021-03-18 – 2021-03-19 (×3): 30 mL via ORAL
  Filled 2021-03-18 (×4): qty 30

## 2021-03-18 MED ORDER — INSULIN DETEMIR 100 UNIT/ML ~~LOC~~ SOLN
12.0000 [IU] | Freq: Two times a day (BID) | SUBCUTANEOUS | Status: DC
  Administered 2021-03-18 – 2021-03-19 (×3): 12 [IU] via SUBCUTANEOUS
  Filled 2021-03-18 (×5): qty 0.12

## 2021-03-18 MED ORDER — INSULIN DETEMIR 100 UNIT/ML ~~LOC~~ SOLN
8.0000 [IU] | Freq: Two times a day (BID) | SUBCUTANEOUS | Status: DC
  Filled 2021-03-18 (×2): qty 0.08

## 2021-03-18 MED ORDER — INSULIN ASPART 100 UNIT/ML IJ SOLN
0.0000 [IU] | Freq: Three times a day (TID) | INTRAMUSCULAR | Status: DC
  Administered 2021-03-19: 3 [IU] via SUBCUTANEOUS
  Administered 2021-03-19: 5 [IU] via SUBCUTANEOUS
  Filled 2021-03-18 (×2): qty 1

## 2021-03-18 NOTE — Plan of Care (Signed)
Patient had an uneventful shift. No changes in neurological and neurovascular assessments. No complaints of pain and respiratory distress.  Vital signs within normal range.Denies any needs at this time. All Safety measures maintained. Care continues.  Problem: Education: Goal: Knowledge of General Education information will improve Description: Including pain rating scale, medication(s)/side effects and non-pharmacologic comfort measures Outcome: Progressing   Problem: Health Behavior/Discharge Planning: Goal: Ability to manage health-related needs will improve Outcome: Progressing   Problem: Clinical Measurements: Goal: Ability to maintain clinical measurements within normal limits will improve Outcome: Progressing Goal: Will remain free from infection Outcome: Progressing Goal: Diagnostic test results will improve Outcome: Progressing Goal: Respiratory complications will improve Outcome: Progressing Goal: Cardiovascular complication will be avoided Outcome: Progressing   Problem: Activity: Goal: Risk for activity intolerance will decrease Outcome: Progressing   Problem: Nutrition: Goal: Adequate nutrition will be maintained Outcome: Progressing   Problem: Coping: Goal: Level of anxiety will decrease Outcome: Progressing   Problem: Elimination: Goal: Will not experience complications related to bowel motility Outcome: Progressing Goal: Will not experience complications related to urinary retention Outcome: Progressing   Problem: Pain Managment: Goal: General experience of comfort will improve Outcome: Progressing   Problem: Safety: Goal: Ability to remain free from injury will improve Outcome: Progressing   Problem: Skin Integrity: Goal: Risk for impaired skin integrity will decrease Outcome: Progressing

## 2021-03-18 NOTE — Progress Notes (Signed)
Inpatient Diabetes Program Recommendations  AACE/ADA: New Consensus Statement on Inpatient Glycemic Control   Target Ranges:  Prepandial:   less than 140 mg/dL      Peak postprandial:   less than 180 mg/dL (1-2 hours)      Critically ill patients:  140 - 180 mg/dL   Results for Donna Brennan, Donna Brennan (MRN [id number]) as of 03/18/2021 08:58  Ref. Range 03/17/2021 07:25 03/17/2021 18:02 03/17/2021 21:58 03/18/2021 07:45  Glucose-Capillary Latest Ref Range: 70 - 99 mg/dL 207 (H) 307 (H) 242 (H) 272 (H)    Review of Glycemic Control  Diabetes history: DM2 Outpatient Diabetes medications: Lantus 12 units QAM, Lantus 20 units QHS Current orders for Inpatient glycemic control: Novolog 0-9 units TID with meals, Levemir 4 units BID; Solumedrol 60 mg Q12H  Inpatient Diabetes Program Recommendations:    Insulin: If steroids are continued, please consider increasing Levemir to 8 units BID.   Thanks, Barnie Alderman, RN, MSN, CDE Diabetes Coordinator Inpatient Diabetes Program [phone] (Team Pager from 8am to 5pm)

## 2021-03-18 NOTE — Progress Notes (Signed)
IV team consulted for PIV placement.  Pt has port-a-cath.  MD provided order to access.

## 2021-03-18 NOTE — Progress Notes (Signed)
Initial Nutrition Assessment RD working remotely.  DOCUMENTATION CODES:   Not applicable  INTERVENTION:  - will order [employer] 1.4 once/day, each supplement provide 455 kcal and 20 grams protein. - will order Ensure Enlive once/day PRN, each supplement provides 350 kcal and 20 grams protein/ - will order 30 ml Prosource Plus BID, each supplement provides 100 kcal and 15 grams protein.  - recommend liberalize diet from Heart Healthy, Carb Modified to only Carb Modified.  - complete NFPE when feasible.   NUTRITION DIAGNOSIS:   Increased nutrient needs related to acute illness, catabolic illness ([id number] infection) as evidenced by estimated needs.  GOAL:   Patient will meet greater than or equal to 90% of their needs  MONITOR:   PO intake, Supplement acceptance, Labs, Weight trends  REASON FOR ASSESSMENT:   Malnutrition Screening Tool  ASSESSMENT:   75 y.o. female with medical history of DM, stage 3-4 CKD, COPD, lung cancer with mets, and GERD. She presented to the ED for evaluation of cough productive of yellow phlegm, worsening SOB, and weakness. She tested positive on home COVID-19 test on 6/11 AM. Her husband is confirmed to be COVID-19 positive. She tested positive for COVID-19 in the ED. She has had 2 COVID vaccines, but has not had the booster. CT angiogram of chest did not show pulmonary embolism.  Unable to reach patient by phone. RD is working at another campus and unable to visit patient's room.   She is being followed by [facility] and was assessed by that RD on 4/27, 5/23, 5/24, and 6/6. On 6/6 RD saw patient during infusion. She was on megace at that time and felt it was slightly helpful. She drinks 1 carton (325 ml) [employer] 1.4 once/day and mixes chocolate syrup into it.   Will order [employer] 1.4, but this is only available in vanilla. Will order PRN Ensure in case patient will only accept chocolate supplements.   Weight on 6/11 was documented as 125  lb and appears to be a stated weight. Weight on 6/6 and 5/23 also documented as 125 lb. Weight on 5/2 documented as 129 lb and weight on 4/22 was 133 lb. This indicates 8 lb weight loss (6% body weight) in the past 1.5-2 months.  Per notes: - sepsis - COVID-19 PNA  - AKI - COPD with exacerbation - lung cancer with liver mets   Labs reviewed; CBGs:272 and 287 mg/dl, K: 3.2 mmol/l, BUN: 29 mg/dl, creatinine: 1.04 mg/dl, Ca: 7.6 mg/dl, Mg: 1.5 mg/dl, GFR: 56 ml/min. Medications reviewed; 500 mg ascorbic acid/day, 1 mg folvite/day, sliding scale novolog, 8 units levemir BID, 2 g IV Mg sulfate x1 run 6/13, 625 mg megace/day, 60 mg solu-medrol BID today, 50 mg deltasone/day starting 6/15, 1 tablet multivitamin with minerals/day, 40 mg oral protonix/day, 40 mEq Klor-Con x1 dose 6/13, 100 mg IV remdesivir x1 dose/day 6/12-6/15, 1000 mcg oral cyanocobalamin/day, 220 mg zinc sulfate/day.      NUTRITION - FOCUSED PHYSICAL EXAM:  Unable to complete.  Diet Order:   Diet Order             Diet heart healthy/carb modified Room service appropriate? Yes; Fluid consistency: Thin  Diet effective now                   EDUCATION NEEDS:   Not appropriate for education at this time  Skin:  Skin Assessment: Reviewed RN Assessment  Last BM:  PTA/unknown  Height:   Ht Readings from Last  1 Encounters:  03/30/2021 5' 1.5" (1.562 m)    Weight:   Wt Readings from Last 1 Encounters:  03/17/2021 56.7 kg      Estimated Nutritional Needs:  Kcal:  1850-2050 kcal Protein:  95-110 grams Fluid:  >/= 2 L/day      Jarome Matin, MS, RD, LDN, CNSC Inpatient Clinical Dietitian RD pager # available in [facility]  After hours/weekend pager # available in [facility]

## 2021-03-18 NOTE — Progress Notes (Addendum)
Progress Note    Donna Brennan  QZE:[id number] DOB: [birth year]  DOA: 03/24/2021 PCP: Adin Hector, MD      Brief Narrative:    Medical records reviewed and are as summarized below:  Donna Brennan is a 75 y.o. 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  She presented to the hospital with worsening cough.  She has a history of chronic cough at this cough was much worse.  She was hypoxic in the emergency room with oxygen saturation of 85% on room air.  She tested positive for COVID-19 infection.  She was admitted to the hospital for severe sepsis secondary to COVID-19 pneumonia, with probable super bacterial infection, complicated by acute hypoxic respiratory failure and AKI.    Assessment/Plan:   Principal Problem:   Sepsis ([place]) Active Problems:   Hypertension   CKD stage 3 due to type 2 diabetes mellitus ([place])   Primary cancer of left lower lobe of lung ([place])   Pneumonia due to COVID-19 virus   Acute respiratory failure due to COVID-19 [facility])   COPD with acute exacerbation (HCC)   AKI (acute kidney injury) ([place])    Body mass index is 23.24 kg/m.   Severe sepsis secondary to COVID-19 pneumonia with suspected bacterial infection: Continue remdesivir, steroids and antibiotics.  She had received 2 doses of COVID-19 vaccine but has not had the booster dose.  She was exposed to her husband who has COVID-19 infection.  Acute hypoxic respiratory failure: Improved.  She is tolerating room air.   Hypokalemia and hypomagnesemia: Replete potassium and magnesium respectively.  Monitor levels.  Type II DM with hyperglycemia: Increase Lantus to 12 units twice daily.  Continue NovoLog as needed for hyperglycemia.  AKI: Improved.  COPD exacerbation: Continue bronchodilators and  steroids  Stage IV poorly differentiated adenocarcinoma with unclear primary.  Primary disease suspected to be lung versus hepatobiliary.  Outpatient follow-up with oncologist.  Pancytopenia: Likely from chemotherapy.  Monitor H&H.  No indication for blood transfusion at this time.  Immunocompromised state.  General weakness: PT evaluation  Diet Order             Diet heart healthy/carb modified Room service appropriate? Yes; Fluid consistency: Thin  Diet effective now                      Consultants: Oncologist   Procedures: None     Medications:    vitamin C  500 mg Oral Daily   enoxaparin (LOVENOX) injection  40 mg Subcutaneous Q24H   ezetimibe  10 mg Oral Daily   folic acid  1 mg Oral Daily   insulin aspart  0-9 Units Subcutaneous TID WC   insulin detemir  8 Units Subcutaneous BID   lidocaine-prilocaine  1 application Topical Once   megestrol  625 mg Oral Daily   methylPREDNISolone (SOLU-MEDROL) injection  60 mg Intravenous Q12H   Followed by   Derrill Memo ON 03/20/2021] predniSONE  50 mg Oral Daily   mometasone-formoterol  2 puff Inhalation BID   multivitamin with minerals  1 tablet Oral Daily   pantoprazole  40 mg Oral Daily   potassium chloride  40 mEq Oral Once   rosuvastatin  40 mg Oral QPM   vitamin B-12  1,000 mcg Oral Daily   zinc sulfate  220 mg Oral  Daily   Continuous Infusions:  azithromycin     cefTRIAXone (ROCEPHIN)  IV 2 g (03/17/21 1857)   magnesium sulfate bolus IVPB     remdesivir 100 mg in NS 100 mL 100 mg (03/18/21 1107)     Anti-infectives (From admission, onward)    Start     Dose/Rate Route Frequency Ordered Stop   03/18/21 1500  azithromycin (ZITHROMAX) 500 mg in sodium chloride 0.9 % 250 mL IVPB        500 mg 250 mL/hr over 60 Minutes Intravenous Every 24 hours 03/18/21 1233 03/18/21 2359   03/17/21 1000  remdesivir 100 mg in sodium chloride 0.9 % 100 mL IVPB       See Hyperspace for full Linked Orders Report.   100 mg 200  mL/hr over 30 Minutes Intravenous Daily 03/30/2021 1442 03/21/21 0959   03/27/2021 1600  remdesivir 200 mg in sodium chloride 0.9% 250 mL IVPB       See Hyperspace for full Linked Orders Report.   200 mg 580 mL/hr over 30 Minutes Intravenous Once 03/26/2021 1442 03/17/2021 1700   03/28/2021 1500  cefTRIAXone (ROCEPHIN) 2 g in sodium chloride 0.9 % 100 mL IVPB        2 g 200 mL/hr over 30 Minutes Intravenous Every 24 hours 04/02/2021 1439 03/18/21 2359   03/26/2021 1500  azithromycin (ZITHROMAX) 500 mg in sodium chloride 0.9 % 250 mL IVPB  Status:  Discontinued        500 mg 250 mL/hr over 60 Minutes Intravenous Every 24 hours 03/21/2021 1439 03/18/21 1233   03/24/2021 1400  aztreonam (AZACTAM) 2 g in sodium chloride 0.9 % 100 mL IVPB        2 g 200 mL/hr over 30 Minutes Intravenous  Once 03/29/2021 1346 03/09/2021 1427   03/09/2021 1400  metroNIDAZOLE (FLAGYL) IVPB 500 mg        500 mg 100 mL/hr over 60 Minutes Intravenous  Once 04/04/2021 1346 03/27/2021 1536   03/27/2021 1400  vancomycin (VANCOCIN) IVPB 1000 mg/200 mL premix        1,000 mg 200 mL/hr over 60 Minutes Intravenous  Once 03/22/2021 1346 04/02/2021 1700              Family Communication/Anticipated D/C date and plan/Code Status   DVT prophylaxis: enoxaparin (LOVENOX) injection 40 mg Start: 03/17/21 2200     Code Status: Full Code  Family Communication: None Disposition Plan:    Status is: Inpatient  Remains inpatient appropriate because:IV treatments appropriate due to intensity of illness or inability to take PO  Dispo: The patient is from: Home              Anticipated d/c is to: Home              Patient currently is not medically stable to d/c.   Difficult to place patient No           Subjective:   Interval events noted.  C/o cough and generalized weakness.  Objective:    Vitals:   03/17/21 1449 03/17/21 2057 03/18/21 0700 03/18/21 1132  BP: 139/67 (!) 135/54 133/65 125/64  Pulse: 94 100 92 100  Resp: 19 18 20  16   Temp: 98.1 F (36.7 C) 98.1 F (36.7 C) 98 F (36.7 C) 97.8 F (36.6 C)  TempSrc: Oral Oral Oral Oral  SpO2: 99% 97% 96% 91%  Weight:      Height:       No data found.  Intake/Output Summary (Last 24 hours) at 03/18/2021 1250 Last data filed at 03/18/2021 0326 Gross per 24 hour  Intake 568.56 ml  Output 125 ml  Net 443.56 ml   Filed Weights   03/30/2021 1211  Weight: 56.7 kg    Exam:   GEN: NAD SKIN: No rash EYES: EOMI ENT: MMM CV: RRR PULM: CTA B ABD: soft, ND, NT, +BS CNS: AAO x 3, non focal EXT: No edema or tenderness          Data Reviewed:   I have personally reviewed following labs and imaging studies:  Labs: Labs show the following:   Basic Metabolic Panel: Recent Labs  Lab 03/23/2021 1225 03/17/21 0648 03/18/21 0521  NA 134* 137 137  K 4.5 3.6 3.2*  CL 101 111 110  CO2 19* 20* 21*  GLUCOSE 293* 224* 297*  BUN 29* 28* 29*  CREATININE 1.50* 1.07* 1.04*  CALCIUM 8.4* 7.5* 7.6*  MG  --  1.4* 1.5*  PHOS  --  3.3 3.0   GFR Estimated Creatinine Clearance: 35.6 mL/min (A) (by C-G formula based on SCr of 1.04 mg/dL (H)). Liver Function Tests: Recent Labs  Lab 03/28/2021 1225 03/17/21 0648 03/18/21 0521  AST 39 26 26  ALT 16 14 14   ALKPHOS 113 72 67  BILITOT 0.9 0.4 0.4  PROT 8.3* 6.0* 5.9*  ALBUMIN 2.3* 1.7* 1.6*   No results for input(s): LIPASE, AMYLASE in the last 168 hours. No results for input(s): AMMONIA in the last 168 hours. Coagulation profile Recent Labs  Lab 03/23/2021 1225 03/17/21 0600  INR 1.2 1.4*    CBC: Recent Labs  Lab 04/03/2021 1225 03/17/21 0648 03/18/21 0521  WBC 12.8* 3.7* 3.0*  NEUTROABS 10.3* 3.1 2.3  HGB 9.8* 7.6* 7.6*  HCT 30.6* 23.2* 23.0*  MCV 102.7* 103.1* 101.3*  PLT 222 127* 142*   Cardiac Enzymes: No results for input(s): CKTOTAL, CKMB, CKMBINDEX, TROPONINI in the last 168 hours. BNP (last 3 results) No results for input(s): PROBNP in the last 8760 hours. CBG: Recent Labs  Lab  03/17/21 0725 03/17/21 1802 03/17/21 2158 03/18/21 0745 03/18/21 1136  GLUCAP 207* 307* 242* 272* 287*   D-Dimer: Recent Labs    03/17/21 0600 03/18/21 0521  DDIMER 2.56* 3.31*   Hgb A1c: No results for input(s): HGBA1C in the last 72 hours. Lipid Profile: No results for input(s): CHOL, HDL, LDLCALC, TRIG, CHOLHDL, LDLDIRECT in the last 72 hours. Thyroid function studies: No results for input(s): TSH, T4TOTAL, T3FREE, THYROIDAB in the last 72 hours.  Invalid input(s): FREET3 Anemia work up: Recent Labs    03/17/21 0648 03/18/21 0521  FERRITIN 1,208* 1,120*   Sepsis Labs: Recent Labs  Lab 03/20/2021 1225 03/29/2021 1226 04/04/2021 1408 03/17/21 0648 03/18/21 0521  PROCALCITON  --  0.56  --   --  0.49  WBC 12.8*  --   --  3.7* 3.0*  LATICACIDVEN 4.8*  --  1.4  --   --     Microbiology Recent Results (from the past 240 hour(s))  Culture, blood (Routine x 2)     Status: None (Preliminary result)   Collection Time: 03/12/2021 12:25 PM   Specimen: BLOOD  Result Value Ref Range Status   Specimen Description BLOOD LEFT [facility]  Final   Special Requests   Final    BOTTLES DRAWN AEROBIC AND ANAEROBIC Blood Culture adequate volume   Culture   Final    NO GROWTH 2 DAYS Performed at [facility], [address] [place], [place] [address]    Report Status PENDING  Incomplete  Culture, blood (Routine x 2)     Status: None (Preliminary result)   Collection Time: 03/17/2021 12:25 PM   Specimen: BLOOD  Result Value Ref Range Status   Specimen Description BLOOD RIGHT [facility]  Final   Special Requests   Final    BOTTLES DRAWN AEROBIC AND ANAEROBIC Blood Culture adequate volume   Culture   Final    NO GROWTH 2 DAYS Performed at [facility], [address] [place],  [address]    Report Status PENDING  Incomplete  Resp Panel by RT-PCR (Flu A&B, Covid) Nasopharyngeal Swab     Status: Abnormal   Collection Time: 03/31/2021 12:41 PM   Specimen: Nasopharyngeal  Swab; Nasopharyngeal(NP) swabs in vial transport medium  Result Value Ref Range Status   SARS Coronavirus 2 by RT PCR POSITIVE (A) NEGATIVE Final    Comment: RESULT CALLED TO, READ BACK BY AND VERIFIED WITH: RUDD,S AT 1349 ON 03/08/2021 BY MOSLEY,J (NOTE) SARS-CoV-2 target nucleic acids are DETECTED.  The SARS-CoV-2 RNA is generally detectable in upper respiratory specimens during the acute phase of infection. Positive results are indicative of the presence of the identified virus, but do not rule out bacterial infection or co-infection with other pathogens not detected by the test. Clinical correlation with patient history and other diagnostic information is necessary to determine patient infection status. The expected result is Negative.  Fact Sheet for Patients: EntrepreneurPulse.com.au  Fact Sheet for Healthcare Providers: IncredibleEmployment.be  This test is not yet approved or cleared by the [place] FDA and  has been authorized for detection and/or diagnosis of SARS-CoV-2 by FDA under an Emergency Use Authorization (EUA).  This EUA will remain in effect (meaning this test  can be used) for the duration of  the COVID-19 declaration under Section 564(b)(1) of the Act, 21 U.S.C. section 360bbb-3(b)(1), unless the authorization is terminated or revoked sooner.     Influenza A by PCR NEGATIVE NEGATIVE Final   Influenza B by PCR NEGATIVE NEGATIVE Final    Comment: (NOTE) The Xpert Xpress SARS-CoV-2/FLU/RSV plus assay is intended as an aid in the diagnosis of influenza from Nasopharyngeal swab specimens and should not be used as a sole basis for treatment. Nasal washings and aspirates are unacceptable for Xpert Xpress SARS-CoV-2/FLU/RSV testing.  Fact Sheet for Patients: EntrepreneurPulse.com.au  Fact Sheet for Healthcare Providers: IncredibleEmployment.be  This test is not yet approved or cleared  by the [place] FDA and has been authorized for detection and/or diagnosis of SARS-CoV-2 by FDA under an Emergency Use Authorization (EUA). This EUA will remain in effect (meaning this test can be used) for the duration of the COVID-19 declaration under Section 564(b)(1) of the Act, 21 U.S.C. section 360bbb-3(b)(1), unless the authorization is terminated or revoked.  Performed at [facility], [address] [place],  [address]     Procedures and diagnostic studies:  CT Angio Chest PE W and/or Wo Contrast  Result Date: 04/04/2021 CLINICAL DATA:  Weakness. Cough and shortness of breath. COVID positive. History of COPD. EXAM: CT ANGIOGRAPHY CHEST WITH CONTRAST TECHNIQUE: Multidetector CT imaging of the chest was performed using the standard protocol during bolus administration of intravenous contrast. Multiplanar CT image reconstructions and MIPs were obtained to evaluate the vascular anatomy. CONTRAST:  54mL OMNIPAQUE IOHEXOL 350 MG/ML SOLN COMPARISON:  Plain film of earlier today.  Chest CT of 02/19/2021. FINDINGS: Cardiovascular: The quality of this exam for  evaluation of pulmonary embolism is good. No evidence of pulmonary embolism. Right Port-A-Cath tip low SVC. Aortic atherosclerosis. Normal heart size, without pericardial effusion. Three vessel coronary artery calcification. Mediastinum/Nodes: No supraclavicular adenopathy. Index precarinal 7 mm node on 36/4 is similar to 8 mm on the prior. A node within the subcarinal station with extension into the azygoesophageal recess measures 1.4 cm on 44/4 versus 7 mm on the prior exam (when remeasured). Left perihilar soft tissue thickening which is likely radiation induced. No well-defined adenopathy. Lungs/Pleura: Trace left pleural fluid. Left endobronchial compression is felt to be similar to on the prior. Worsening left lower lobe aeration with progressive airspace and ground-glass opacity. Development of bilateral, right  greater than left peripheral predominant ground-glass. Similar dependent lingular consolidation. Lateral left lower lobe 7 mm lung nodule is unchanged on 52/6. The more central anterior left lower lobe nodule is likely obscured by airspace disease. Upper Abdomen: Right hepatic lobe treated metastasis is not well evaluated secondary to bolus timing. High left hepatic lobe cysts. Normal imaged portions of the spleen, stomach, pancreas, gallbladder, kidneys, adrenal glands. Abdominal aortic and branch vessel atherosclerosis. Musculoskeletal: No acute osseous abnormality. Review of the MIP images confirms the above findings. IMPRESSION: 1.  No evidence of pulmonary embolism. 2. Development of multifocal peripheral predominant ground-glass opacities, highly suspicious for COVID-19 pneumonia. 3. Left lower lobe pulmonary opacities are slightly progressive and likely represent a combination of COVID-19 pneumonia and chronic infection or aspiration. 4. Similar appearance of probable radiation induced consolidation within the lingula and anterior left lower lobe. Anterior left lower lobe nodularity is similar. 5. Subcarinal adenopathy, mild and new since 02/18/2021. Most likely reactive. Metastatic disease could look similar. Recommend attention on follow-up. 6. Coronary artery atherosclerosis. Aortic Atherosclerosis (ICD10-I70.0). 7. Right hepatic lobe metastasis, suboptimally evaluated secondary to bolus timing. Electronically Signed   By: Abigail Miyamoto M.D.   On: 03/12/2021 14:56   DG Chest Portable 1 View  Result Date: 03/14/2021 CLINICAL DATA:  Shortness of breath with productive cough and weakness. COVID-19 positive this morning. EXAM: PORTABLE CHEST 1 VIEW COMPARISON:  12/28/2020 and chest CT 02/18/2021 FINDINGS: Right IJ Port-A-Cath unchanged. Lungs are adequately inflated demonstrate patchy density over the left mid to lower lung which may be slightly worse in the left base. Extensive bilateral costochondral  calcification. Cardiomediastinal silhouette and remainder of the exam is unchanged. IMPRESSION: Chronic patchy density over the left mid to lower lung. This may be slightly worse over the left base which may be due to atelectasis or early superimposed infection. Electronically Signed   By: Marin Olp M.D.   On: 03/24/2021 13:08               LOS: 2 days   Salihah Peckham  [facility]   Pager on [email or web address]. If 7PM-7AM, please contact night-coverage at [email or web address]     03/18/2021, 12:50 PM

## 2021-03-19 ENCOUNTER — Encounter: Payer: Self-pay | Admitting: Internal Medicine

## 2021-03-19 DIAGNOSIS — U071 COVID-19: Secondary | ICD-10-CM | POA: Diagnosis not present

## 2021-03-19 DIAGNOSIS — A419 Sepsis, unspecified organism: Secondary | ICD-10-CM | POA: Diagnosis not present

## 2021-03-19 DIAGNOSIS — C3432 Malignant neoplasm of lower lobe, left bronchus or lung: Secondary | ICD-10-CM | POA: Diagnosis not present

## 2021-03-19 DIAGNOSIS — J441 Chronic obstructive pulmonary disease with (acute) exacerbation: Secondary | ICD-10-CM | POA: Diagnosis not present

## 2021-03-19 LAB — COMPREHENSIVE METABOLIC PANEL
ALT: 22 U/L (ref 0–44)
AST: 42 U/L — ABNORMAL HIGH (ref 15–41)
Albumin: 1.6 g/dL — ABNORMAL LOW (ref 3.5–5.0)
Alkaline Phosphatase: 142 U/L — ABNORMAL HIGH (ref 38–126)
Anion gap: 6 (ref 5–15)
BUN: 29 mg/dL — ABNORMAL HIGH (ref 8–23)
CO2: 23 mmol/L (ref 22–32)
Calcium: 7.9 mg/dL — ABNORMAL LOW (ref 8.9–10.3)
Chloride: 112 mmol/L — ABNORMAL HIGH (ref 98–111)
Creatinine, Ser: 1.03 mg/dL — ABNORMAL HIGH (ref 0.44–1.00)
GFR, Estimated: 56 mL/min — ABNORMAL LOW (ref >60)
Glucose, Bld: 265 mg/dL — ABNORMAL HIGH (ref 70–99)
Potassium: 3.2 mmol/L — ABNORMAL LOW (ref 3.5–5.1)
Sodium: 141 mmol/L (ref 135–145)
Total Bilirubin: 0.4 mg/dL (ref 0.3–1.2)
Total Protein: 6 g/dL — ABNORMAL LOW (ref 6.5–8.1)

## 2021-03-19 LAB — CBC WITH DIFFERENTIAL/PLATELET
Abs Immature Granulocytes: 0.03 10*3/uL (ref 0.00–0.07)
Basophils Absolute: 0 10*3/uL (ref 0.0–0.1)
Basophils Relative: 1 %
Eosinophils Absolute: 0 10*3/uL (ref 0.0–0.5)
Eosinophils Relative: 0 %
HCT: 21.7 % — ABNORMAL LOW (ref 36.0–46.0)
Hemoglobin: 7.4 g/dL — ABNORMAL LOW (ref 12.0–15.0)
Immature Granulocytes: 1 %
Lymphocytes Relative: 3 %
Lymphs Abs: 0.1 10*3/uL — ABNORMAL LOW (ref 0.7–4.0)
MCH: 33.6 pg (ref 26.0–34.0)
MCHC: 34.1 g/dL (ref 30.0–36.0)
MCV: 98.6 fL (ref 80.0–100.0)
Monocytes Absolute: 0.3 10*3/uL (ref 0.1–1.0)
Monocytes Relative: 8 %
Neutro Abs: 2.8 10*3/uL (ref 1.7–7.7)
Neutrophils Relative %: 87 %
Platelets: 132 10*3/uL — ABNORMAL LOW (ref 150–400)
RBC: 2.2 MIL/uL — ABNORMAL LOW (ref 3.87–5.11)
RDW: 18.3 % — ABNORMAL HIGH (ref 11.5–15.5)
Smear Review: NORMAL
WBC: 3.2 10*3/uL — ABNORMAL LOW (ref 4.0–10.5)
nRBC: 0 % (ref 0.0–0.2)

## 2021-03-19 LAB — D-DIMER, QUANTITATIVE: D-Dimer, Quant: 2.73 ug/mL-FEU — ABNORMAL HIGH (ref 0.00–0.50)

## 2021-03-19 LAB — GLUCOSE, CAPILLARY
Glucose-Capillary: 184 mg/dL — ABNORMAL HIGH (ref 70–99)
Glucose-Capillary: 213 mg/dL — ABNORMAL HIGH (ref 70–99)
Glucose-Capillary: 108 mg/dL — ABNORMAL HIGH (ref 70–99)

## 2021-03-19 LAB — IRON AND TIBC
Iron: 24 ug/dL — ABNORMAL LOW (ref 28–170)
Saturation Ratios: 14 % (ref 10.4–31.8)
TIBC: 175 ug/dL — ABNORMAL LOW (ref 250–450)
UIBC: 151 ug/dL

## 2021-03-19 LAB — PHOSPHORUS: Phosphorus: 2.1 mg/dL — ABNORMAL LOW (ref 2.5–4.6)

## 2021-03-19 LAB — VITAMIN B12: Vitamin B-12: 3256 pg/mL — ABNORMAL HIGH (ref 180–914)

## 2021-03-19 LAB — MAGNESIUM: Magnesium: 1.7 mg/dL (ref 1.7–2.4)

## 2021-03-19 LAB — FERRITIN: Ferritin: 1155 ng/mL — ABNORMAL HIGH (ref 11–307)

## 2021-03-19 LAB — C-REACTIVE PROTEIN: CRP: 9.1 mg/dL — ABNORMAL HIGH (ref <1.0)

## 2021-03-19 LAB — PREPARE RBC (CROSSMATCH)

## 2021-03-19 MED ORDER — POTASSIUM CHLORIDE CRYS ER 20 MEQ PO TBCR
40.0000 meq | EXTENDED_RELEASE_TABLET | ORAL | Status: AC
  Administered 2021-03-19 (×2): 40 meq via ORAL
  Filled 2021-03-19 (×2): qty 2

## 2021-03-19 MED ORDER — MAGNESIUM SULFATE 2 GM/50ML IV SOLN
2.0000 g | Freq: Once | INTRAVENOUS | Status: AC
  Administered 2021-03-19: 2 g via INTRAVENOUS
  Filled 2021-03-19: qty 50

## 2021-03-19 MED ORDER — CHLORHEXIDINE GLUCONATE CLOTH 2 % EX PADS
6.0000 | MEDICATED_PAD | Freq: Every day | CUTANEOUS | Status: DC
  Administered 2021-03-19 – 2021-03-22 (×4): 6 via TOPICAL

## 2021-03-19 MED ORDER — SODIUM CHLORIDE 0.9% IV SOLUTION: Freq: Once | INTRAVENOUS | Status: DC

## 2021-03-19 MED ORDER — HYDROCOD POLST-CPM POLST ER 10-8 MG/5ML PO SUER
5.0000 mL | Freq: Two times a day (BID) | ORAL | Status: DC | PRN
  Administered 2021-03-20: 5 mL via ORAL
  Filled 2021-03-19: qty 5

## 2021-03-19 MED ORDER — K PHOS MONO-SOD PHOS DI & MONO 155-852-130 MG PO TABS
500.0000 mg | ORAL_TABLET | Freq: Three times a day (TID) | ORAL | Status: DC
  Administered 2021-03-19 (×3): 500 mg via ORAL
  Filled 2021-03-19 (×4): qty 2

## 2021-03-19 NOTE — Progress Notes (Addendum)
Progress Note    Donna Brennan  XQJ:[id number] DOB: 11/28/1945  DOA: 03/27/2021 PCP: Adin Hector, MD      Brief Narrative:    Medical records reviewed and are as summarized below:  Donna Brennan is a 75 y.o. 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  She presented to the hospital with worsening cough.  She has a history of chronic cough at this cough was much worse.  She was hypoxic in the emergency room with oxygen saturation of 85% on room air.  She tested positive for COVID-19 infection.  She was admitted to the hospital for severe sepsis secondary to COVID-19 pneumonia, with probable super bacterial infection, complicated by acute hypoxic respiratory failure and AKI.  She was treated with empiric IV antibiotics, steroids, IV remdesivir and oxygen via nasal cannula.  She has pancytopenia likely from recent chemotherapy.  She was transfused with 1 unit of packed red blood cells for symptomatic anemia.    Assessment/Plan:   Principal Problem:   Sepsis ([place]) Active Problems:   Hypertension   CKD stage 3 due to type 2 diabetes mellitus ([place])   Primary cancer of left lower lobe of lung ([place])   Pneumonia due to COVID-19 virus   Acute respiratory failure due to COVID-19 [facility])   COPD with acute exacerbation (HCC)   AKI (acute kidney injury) ([place])    Body mass index is 23.24 kg/m.   Severe sepsis secondary to COVID-19 pneumonia with suspected bacterial infection: Continue IV remdesivir and steroids.  Discontinue empiric IV ceftriaxone and azithromycin.  Continue Tussionex for cough.  She and her daughter requested increasing dose of Tussionex to 3 times a day because she takes it 3 times a day at home.  They were told that the standard dose of Tussionex is every 12 hours and therefore I  do not recommend taking it 3 times a day.  I offered to substitute Hycodan syrup for Tussionex but they insisted on Tussionex because Hycodan does not help her.  She had received 2 doses of COVID-19 vaccine but has not had the booster dose.  She was exposed to her husband who has COVID-19 infection.  Acute hypoxic respiratory failure: She is back on 2 L/min oxygen via nasal cannula.  She does not use oxygen at home.  Hypokalemia, hypophosphatemia and hypomagnesemia: Replete electrolytes and monitor levels.  Type II DM with hyperglycemia: Continue Levemir and NovoLog.  Monitor glucose levels closely.  AKI on CKD 3a: Improved.   COPD exacerbation: Continue bronchodilators and steroids  Stage IV poorly differentiated adenocarcinoma with unclear primary.  Primary disease suspected to be lung versus hepatobiliary.  Outpatient follow-up with oncologist.  Pancytopenia: Transfuse 1 unit of packed red blood cells for symptomatic anemia.  Risk and benefits of blood transfusion were discussed and patient is agreeable.  Repeat CBC tomorrow.  Immunocompromised state.  General weakness: PT evaluation  Diet Order             Diet Carb Modified Fluid consistency: Thin; Room service appropriate? Yes  Diet effective now                      Consultants: Oncologist   Procedures: None     Medications:    (feeding supplement) PROSource Plus  30 mL Oral BID BM   sodium  chloride   Intravenous Once   vitamin C  500 mg Oral Daily   Chlorhexidine Gluconate Cloth  6 each Topical Daily   enoxaparin (LOVENOX) injection  40 mg Subcutaneous Q24H   ezetimibe  10 mg Oral Daily   feeding supplement (K[employer]MS STANDARD 1.4)  325 mL Oral Daily   folic acid  1 mg Oral Daily   insulin aspart  0-15 Units Subcutaneous TID WC   insulin aspart  0-5 Units Subcutaneous QHS   insulin detemir  12 Units Subcutaneous BID   lidocaine-prilocaine  1 application Topical Once   megestrol  625 mg Oral Daily    mometasone-formoterol  2 puff Inhalation BID   multivitamin with minerals  1 tablet Oral Daily   pantoprazole  40 mg Oral Daily   phosphorus  500 mg Oral TID   [START ON 03/20/2021] predniSONE  50 mg Oral Daily   rosuvastatin  40 mg Oral QPM   vitamin B-12  1,000 mcg Oral Daily   zinc sulfate  220 mg Oral Daily   Continuous Infusions:  remdesivir 100 mg in NS 100 mL 100 mg (03/19/21 1123)     Anti-infectives (From admission, onward)    Start     Dose/Rate Route Frequency Ordered Stop   03/18/21 1500  azithromycin (ZITHROMAX) 500 mg in sodium chloride 0.9 % 250 mL IVPB        500 mg 250 mL/hr over 60 Minutes Intravenous Every 24 hours 03/18/21 1233 03/18/21 1754   03/17/21 1000  remdesivir 100 mg in sodium chloride 0.9 % 100 mL IVPB       See Hyperspace for full Linked Orders Report.   100 mg 200 mL/hr over 30 Minutes Intravenous Daily 03/18/2021 1442 03/21/21 0959   03/13/2021 1600  remdesivir 200 mg in sodium chloride 0.9% 250 mL IVPB       See Hyperspace for full Linked Orders Report.   200 mg 580 mL/hr over 30 Minutes Intravenous Once 03/15/2021 1442 03/19/2021 1700   03/27/2021 1500  cefTRIAXone (ROCEPHIN) 2 g in sodium chloride 0.9 % 100 mL IVPB        2 g 200 mL/hr over 30 Minutes Intravenous Every 24 hours 03/28/2021 1439 03/18/21 1751   03/28/2021 1500  azithromycin (ZITHROMAX) 500 mg in sodium chloride 0.9 % 250 mL IVPB  Status:  Discontinued        500 mg 250 mL/hr over 60 Minutes Intravenous Every 24 hours 04/01/2021 1439 03/18/21 1233   03/12/2021 1400  aztreonam (AZACTAM) 2 g in sodium chloride 0.9 % 100 mL IVPB        2 g 200 mL/hr over 30 Minutes Intravenous  Once 03/28/2021 1346 03/12/2021 1427   03/11/2021 1400  metroNIDAZOLE (FLAGYL) IVPB 500 mg        500 mg 100 mL/hr over 60 Minutes Intravenous  Once 03/23/2021 1346 03/18/2021 1536   03/15/2021 1400  vancomycin (VANCOCIN) IVPB 1000 mg/200 mL premix        1,000 mg 200 mL/hr over 60 Minutes Intravenous  Once 03/26/2021 1346 03/15/2021 1700               Family Communication/Anticipated D/C date and plan/Code Status   DVT prophylaxis: enoxaparin (LOVENOX) injection 40 mg Start: 03/17/21 2200     Code Status: Full Code  Family Communication: Rhonda, daughter Disposition Plan:    Status is: Inpatient  Remains inpatient appropriate because:IV treatments appropriate due to intensity of illness or inability to take PO  Dispo:  The patient is from: Home              Anticipated d/c is to: Home              Patient currently is not medically stable to d/c.   Difficult to place patient No           Subjective:   C/o cough.  She was placed back on oxygen because of low oxygen saturation.  She also complains of fatigue and generalized weakness.  Objective:    Vitals:   03/19/21 0917 03/19/21 1158 03/19/21 1345 03/19/21 1426  BP: (!) 147/66 (!) 145/65 138/72 140/70  Pulse: (!) 102 (!) 107 100 98  Resp: 18 18 18 20   Temp: 98.5 F (36.9 C) 99.2 F (37.3 C) 98.9 F (37.2 C) 98.6 F (37 C)  TempSrc: Oral  Oral Oral  SpO2: 97% 94% 96% 95%  Weight:      Height:       No data found.   Intake/Output Summary (Last 24 hours) at 03/19/2021 1603 Last data filed at 03/19/2021 1426 Gross per 24 hour  Intake 500 ml  Output --  Net 500 ml   Filed Weights   03/22/2021 1211  Weight: 56.7 kg    Exam:   GEN: NAD SKIN: Warm and dry EYES: EOMI ENT: MMM CV: RRR PULM: CTA B ABD: soft, ND, NT, +BS CNS: AAO x 3, non focal EXT: No edema or tenderness           Data Reviewed:   I have personally reviewed following labs and imaging studies:  Labs: Labs show the following:   Basic Metabolic Panel: Recent Labs  Lab 04/04/2021 1225 03/17/21 0648 03/18/21 0521 03/19/21 0434  NA 134* 137 137 141  K 4.5 3.6 3.2* 3.2*  CL 101 111 110 112*  CO2 19* 20* 21* 23  GLUCOSE 293* 224* 297* 265*  BUN 29* 28* 29* 29*  CREATININE 1.50* 1.07* 1.04* 1.03*  CALCIUM 8.4* 7.5* 7.6* 7.9*  MG  --  1.4*  1.5* 1.7  PHOS  --  3.3 3.0 2.1*   GFR Estimated Creatinine Clearance: 35.9 mL/min (A) (by C-G formula based on SCr of 1.03 mg/dL (H)). Liver Function Tests: Recent Labs  Lab 03/18/2021 1225 03/17/21 0648 03/18/21 0521 03/19/21 0434  AST 39 26 26 42*  ALT 16 14 14 22   ALKPHOS 113 72 67 142*  BILITOT 0.9 0.4 0.4 0.4  PROT 8.3* 6.0* 5.9* 6.0*  ALBUMIN 2.3* 1.7* 1.6* 1.6*   No results for input(s): LIPASE, AMYLASE in the last 168 hours. No results for input(s): AMMONIA in the last 168 hours. Coagulation profile Recent Labs  Lab 03/30/2021 1225 03/17/21 0600  INR 1.2 1.4*    CBC: Recent Labs  Lab 03/27/2021 1225 03/17/21 0648 03/18/21 0521 03/19/21 0434  WBC 12.8* 3.7* 3.0* 3.2*  NEUTROABS 10.3* 3.1 2.3 2.8  HGB 9.8* 7.6* 7.6* 7.4*  HCT 30.6* 23.2* 23.0* 21.7*  MCV 102.7* 103.1* 101.3* 98.6  PLT 222 127* 142* 132*   Cardiac Enzymes: No results for input(s): CKTOTAL, CKMB, CKMBINDEX, TROPONINI in the last 168 hours. BNP (last 3 results) No results for input(s): PROBNP in the last 8760 hours. CBG: Recent Labs  Lab 03/18/21 1136 03/18/21 1710 03/18/21 2140 03/19/21 0931 03/19/21 1200  GLUCAP 287* 345* 332* 184* 213*   D-Dimer: Recent Labs    03/18/21 0521 03/19/21 0434  DDIMER 3.31* 2.73*   Hgb A1c: No results for input(s): HGBA1C  in the last 72 hours. Lipid Profile: No results for input(s): CHOL, HDL, LDLCALC, TRIG, CHOLHDL, LDLDIRECT in the last 72 hours. Thyroid function studies: No results for input(s): TSH, T4TOTAL, T3FREE, THYROIDAB in the last 72 hours.  Invalid input(s): FREET3 Anemia work up: Recent Labs    03/18/21 0521 03/19/21 0434 03/19/21 1016  VITAMINB12  --   --  3,256*  FERRITIN 1,120* 1,155*  --   TIBC  --   --  175*  IRON  --   --  24*   Sepsis Labs: Recent Labs  Lab 03/08/2021 1225 03/30/2021 1226 03/09/2021 1408 03/17/21 0648 03/18/21 0521 03/19/21 0434  PROCALCITON  --  0.56  --   --  0.49  --   WBC 12.8*  --   --  3.7*  3.0* 3.2*  LATICACIDVEN 4.8*  --  1.4  --   --   --     Microbiology Recent Results (from the past 240 hour(s))  Culture, blood (Routine x 2)     Status: None (Preliminary result)   Collection Time: 03/11/2021 12:25 PM   Specimen: BLOOD  Result Value Ref Range Status   Specimen Description BLOOD LEFT AC  Final   Special Requests   Final    BOTTLES DRAWN AEROBIC AND ANAEROBIC Blood Culture adequate volume   Culture   Final    NO GROWTH 3 DAYS Performed at [facility], [address] [place], [place] [address]    Report Status PENDING  Incomplete  Culture, blood (Routine x 2)     Status: None (Preliminary result)   Collection Time: 03/18/2021 12:25 PM   Specimen: BLOOD  Result Value Ref Range Status   Specimen Description BLOOD RIGHT [facility]  Final   Special Requests   Final    BOTTLES DRAWN AEROBIC AND ANAEROBIC Blood Culture adequate volume   Culture   Final    NO GROWTH 3 DAYS Performed at [facility], [address] [place], [place] [address]    Report Status PENDING  Incomplete  Resp Panel by RT-PCR (Flu A&B, Covid) Nasopharyngeal Swab     Status: Abnormal   Collection Time: 04/04/2021 12:41 PM   Specimen: Nasopharyngeal Swab; Nasopharyngeal(NP) swabs in vial transport medium  Result Value Ref Range Status   SARS Coronavirus 2 by RT PCR POSITIVE (A) NEGATIVE Final    Comment: RESULT CALLED TO, READ BACK BY AND VERIFIED WITH: RUDD,S AT 1349 ON 03/27/2021 BY MOSLEY,J (NOTE) SARS-CoV-2 target nucleic acids are DETECTED.  The SARS-CoV-2 RNA is generally detectable in upper respiratory specimens during the acute phase of infection. Positive results are indicative of the presence of the identified virus, but do not rule out bacterial infection or co-infection with other pathogens not detected by the test. Clinical correlation with patient history and other diagnostic information is necessary to determine patient infection status. The expected result is  Negative.  Fact Sheet for Patients: EntrepreneurPulse.com.au  Fact Sheet for Healthcare Providers: IncredibleEmployment.be  This test is not yet approved or cleared by the [place] FDA and  has been authorized for detection and/or diagnosis of SARS-CoV-2 by FDA under an Emergency Use Authorization (EUA).  This EUA will remain in effect (meaning this test  can be used) for the duration of  the COVID-19 declaration under Section 564(b)(1) of the Act, 21 U.S.C. section 360bbb-3(b)(1), unless the authorization is terminated or revoked sooner.     Influenza A by PCR NEGATIVE NEGATIVE Final   Influenza B by PCR NEGATIVE  NEGATIVE Final    Comment: (NOTE) The Xpert Xpress SARS-CoV-2/FLU/RSV plus assay is intended as an aid in the diagnosis of influenza from Nasopharyngeal swab specimens and should not be used as a sole basis for treatment. Nasal washings and aspirates are unacceptable for Xpert Xpress SARS-CoV-2/FLU/RSV testing.  Fact Sheet for Patients: EntrepreneurPulse.com.au  Fact Sheet for Healthcare Providers: IncredibleEmployment.be  This test is not yet approved or cleared by the [place] FDA and has been authorized for detection and/or diagnosis of SARS-CoV-2 by FDA under an Emergency Use Authorization (EUA). This EUA will remain in effect (meaning this test can be used) for the duration of the COVID-19 declaration under Section 564(b)(1) of the Act, 21 U.S.C. section 360bbb-3(b)(1), unless the authorization is terminated or revoked.  Performed at [facility], [address] [place], [place] [address]     Procedures and diagnostic studies:  No results found.             LOS: 3 days   Zivah Mayr  [facility]   Pager on [email or web address]. If 7PM-7AM, please contact night-coverage at [email or web address]     03/19/2021, 4:03 PM

## 2021-03-19 NOTE — Progress Notes (Signed)
Vitals machine wouldn't validate pt, entered manually

## 2021-03-19 NOTE — Care Management Important Message (Signed)
Important Message  Patient Details  Name: Donna Brennan MRN: [id number] Date of Birth: Jun 21, 1946   Medicare Important Message Given:  N/Donna - LOS <3 / Initial given by admissions     Donna Brennan Donna Brennan 03/19/2021, 9:02 AM

## 2021-03-19 NOTE — Progress Notes (Signed)
PT Cancellation Note  Patient Details Name: BLUMA BURESH MRN: [id number] DOB: 1946/08/09   Cancelled Treatment:    Reason Eval/Treat Not Completed: Other (comment). Consult received and chart reviewed. Pt currently receiving blood transfusion. Will hold exertional activity at this time.   Rocket Gunderson 03/19/2021, 3:15 PM Greggory Stallion, PT, DPT [phone]

## 2021-03-19 NOTE — Plan of Care (Signed)
Daughter called and concerned about not getting tussinex ofetn enough.  Said patient takes "religiously" 3x/day while awake - and has trouble if not. Dr. Text to inform.

## 2021-03-19 NOTE — Plan of Care (Signed)
Dr asked to please call daughter with update after rounding for today - per daughter's request.

## 2021-03-20 ENCOUNTER — Other Ambulatory Visit: Payer: Self-pay

## 2021-03-20 ENCOUNTER — Inpatient Hospital Stay: Payer: Medicare Other

## 2021-03-20 DIAGNOSIS — J1282 Pneumonia due to coronavirus disease 2019: Secondary | ICD-10-CM

## 2021-03-20 DIAGNOSIS — U071 COVID-19: Secondary | ICD-10-CM

## 2021-03-20 DIAGNOSIS — Z978 Presence of other specified devices: Secondary | ICD-10-CM

## 2021-03-20 DIAGNOSIS — Z515 Encounter for palliative care: Secondary | ICD-10-CM

## 2021-03-20 DIAGNOSIS — R0602 Shortness of breath: Secondary | ICD-10-CM

## 2021-03-20 DIAGNOSIS — J9600 Acute respiratory failure, unspecified whether with hypoxia or hypercapnia: Secondary | ICD-10-CM

## 2021-03-20 LAB — GLUCOSE, CAPILLARY
Glucose-Capillary: 108 mg/dL — ABNORMAL HIGH (ref 70–99)
Glucose-Capillary: 111 mg/dL — ABNORMAL HIGH (ref 70–99)
Glucose-Capillary: 127 mg/dL — ABNORMAL HIGH (ref 70–99)
Glucose-Capillary: 140 mg/dL — ABNORMAL HIGH (ref 70–99)
Glucose-Capillary: 170 mg/dL — ABNORMAL HIGH (ref 70–99)
Glucose-Capillary: 47 mg/dL — ABNORMAL LOW (ref 70–99)
Glucose-Capillary: 54 mg/dL — ABNORMAL LOW (ref 70–99)
Glucose-Capillary: 55 mg/dL — ABNORMAL LOW (ref 70–99)
Glucose-Capillary: 60 mg/dL — ABNORMAL LOW (ref 70–99)
Glucose-Capillary: 66 mg/dL — ABNORMAL LOW (ref 70–99)
Glucose-Capillary: 76 mg/dL (ref 70–99)
Glucose-Capillary: 78 mg/dL (ref 70–99)
Glucose-Capillary: 82 mg/dL (ref 70–99)
Glucose-Capillary: 90 mg/dL (ref 70–99)

## 2021-03-20 LAB — BLOOD GAS, ARTERIAL
Acid-Base Excess: 0 mmol/L (ref 0.0–2.0)
Acid-base deficit: 2.4 mmol/L — ABNORMAL HIGH (ref 0.0–2.0)
Bicarbonate: 22.6 mmol/L (ref 20.0–28.0)
Bicarbonate: 21.4 mmol/L (ref 20.0–28.0)
FIO2: 0.4
FIO2: 1
MECHVT: 450 mL
Mechanical Rate: 25
O2 Saturation: 94 %
O2 Saturation: 95.3 %
PEEP: 10 cmH2O
Patient temperature: 37
Patient temperature: 37
pCO2 arterial: 29 mmHg — ABNORMAL LOW (ref 32.0–48.0)
pCO2 arterial: 33 mmHg (ref 32.0–48.0)
pH, Arterial: 7.5 — ABNORMAL HIGH (ref 7.350–7.450)
pH, Arterial: 7.42 (ref 7.350–7.450)
pO2, Arterial: 64 mmHg — ABNORMAL LOW (ref 83.0–108.0)
pO2, Arterial: 76 mmHg — ABNORMAL LOW (ref 83.0–108.0)

## 2021-03-20 LAB — COMPREHENSIVE METABOLIC PANEL
ALT: 74 U/L — ABNORMAL HIGH (ref 0–44)
AST: 184 U/L — ABNORMAL HIGH (ref 15–41)
Albumin: 1.8 g/dL — ABNORMAL LOW (ref 3.5–5.0)
Alkaline Phosphatase: 248 U/L — ABNORMAL HIGH (ref 38–126)
Anion gap: 14 (ref 5–15)
BUN: 25 mg/dL — ABNORMAL HIGH (ref 8–23)
CO2: 21 mmol/L — ABNORMAL LOW (ref 22–32)
Calcium: 7.7 mg/dL — ABNORMAL LOW (ref 8.9–10.3)
Chloride: 107 mmol/L (ref 98–111)
Creatinine, Ser: 0.93 mg/dL (ref 0.44–1.00)
GFR, Estimated: >60 mL/min (ref >60)
Glucose, Bld: 138 mg/dL — ABNORMAL HIGH (ref 70–99)
Potassium: 2.9 mmol/L — ABNORMAL LOW (ref 3.5–5.1)
Sodium: 142 mmol/L (ref 135–145)
Total Bilirubin: 0.3 mg/dL (ref 0.3–1.2)
Total Protein: 6.7 g/dL (ref 6.5–8.1)

## 2021-03-20 LAB — TROPONIN I (HIGH SENSITIVITY)
Troponin I (High Sensitivity): 372 ng/L (ref <18)
Troponin I (High Sensitivity): 862 ng/L (ref <18)
Troponin I (High Sensitivity): 757 ng/L (ref <18)

## 2021-03-20 LAB — BPAM RBC
Blood Product Expiration Date: 202207092359
ISSUE DATE / TIME: 202206141400
Unit Type and Rh: 6200

## 2021-03-20 LAB — CBC WITH DIFFERENTIAL/PLATELET
Abs Immature Granulocytes: 0.1 10*3/uL — ABNORMAL HIGH (ref 0.00–0.07)
Basophils Absolute: 0 10*3/uL (ref 0.0–0.1)
Basophils Relative: 0 %
Eosinophils Absolute: 0 10*3/uL (ref 0.0–0.5)
Eosinophils Relative: 0 %
HCT: 33.8 % — ABNORMAL LOW (ref 36.0–46.0)
Hemoglobin: 11.6 g/dL — ABNORMAL LOW (ref 12.0–15.0)
Immature Granulocytes: 1 %
Lymphocytes Relative: 3 %
Lymphs Abs: 0.3 10*3/uL — ABNORMAL LOW (ref 0.7–4.0)
MCH: 32.7 pg (ref 26.0–34.0)
MCHC: 34.3 g/dL (ref 30.0–36.0)
MCV: 95.2 fL (ref 80.0–100.0)
Monocytes Absolute: 0.3 10*3/uL (ref 0.1–1.0)
Monocytes Relative: 4 %
Neutro Abs: 7.4 10*3/uL (ref 1.7–7.7)
Neutrophils Relative %: 92 %
Platelets: 152 10*3/uL (ref 150–400)
RBC: 3.55 MIL/uL — ABNORMAL LOW (ref 3.87–5.11)
RDW: 18.8 % — ABNORMAL HIGH (ref 11.5–15.5)
Smear Review: NORMAL
WBC Morphology: INCREASED
WBC: 8.1 10*3/uL (ref 4.0–10.5)
nRBC: 0 % (ref 0.0–0.2)

## 2021-03-20 LAB — PHOSPHORUS
Phosphorus: 4.8 mg/dL — ABNORMAL HIGH (ref 2.5–4.6)
Phosphorus: 3.5 mg/dL (ref 2.5–4.6)

## 2021-03-20 LAB — FERRITIN: Ferritin: 2324 ng/mL — ABNORMAL HIGH (ref 11–307)

## 2021-03-20 LAB — TYPE AND SCREEN
ABO/RH(D): A POS
Antibody Screen: NEGATIVE
Unit division: 0

## 2021-03-20 LAB — D-DIMER, QUANTITATIVE: D-Dimer, Quant: 9.06 ug/mL-FEU — ABNORMAL HIGH (ref 0.00–0.50)

## 2021-03-20 LAB — PROCALCITONIN: Procalcitonin: 0.69 ng/mL

## 2021-03-20 LAB — MAGNESIUM
Magnesium: 1.7 mg/dL (ref 1.7–2.4)
Magnesium: 1.6 mg/dL — ABNORMAL LOW (ref 1.7–2.4)

## 2021-03-20 LAB — BRAIN NATRIURETIC PEPTIDE: B Natriuretic Peptide: 1216.8 pg/mL — ABNORMAL HIGH (ref 0.0–100.0)

## 2021-03-20 LAB — HEMOGLOBIN A1C
Hgb A1c MFr Bld: 7.5 % — ABNORMAL HIGH (ref 4.8–5.6)
Mean Plasma Glucose: 169 mg/dL

## 2021-03-20 LAB — C-REACTIVE PROTEIN: CRP: 8.7 mg/dL — ABNORMAL HIGH (ref <1.0)

## 2021-03-20 LAB — POTASSIUM: Potassium: 4.6 mmol/L (ref 3.5–5.1)

## 2021-03-20 MED ORDER — ROSUVASTATIN CALCIUM 10 MG PO TABS
40.0000 mg | ORAL_TABLET | Freq: Every evening | ORAL | Status: DC
  Administered 2021-03-20 – 2021-03-24 (×5): 40 mg
  Filled 2021-03-20 (×5): qty 4

## 2021-03-20 MED ORDER — DEXTROSE 50 % IV SOLN
25.0000 mL | Freq: Once | INTRAVENOUS | Status: AC
  Administered 2021-03-20: 25 mL via INTRAVENOUS

## 2021-03-20 MED ORDER — ALTEPLASE 2 MG IJ SOLR: 2.0000 mg | Freq: Once | INTRAMUSCULAR | Status: DC

## 2021-03-20 MED ORDER — VITAL AF 1.2 CAL PO LIQD
1000.0000 mL | ORAL | Status: DC
  Administered 2021-03-20 – 2021-03-23 (×3): 1000 mL

## 2021-03-20 MED ORDER — VITAMIN B-12 1000 MCG PO TABS
1000.0000 ug | ORAL_TABLET | Freq: Every day | ORAL | Status: DC
  Administered 2021-03-20 – 2021-03-25 (×6): 1000 ug
  Filled 2021-03-20 (×6): qty 1

## 2021-03-20 MED ORDER — HYDROMORPHONE HCL 1 MG/ML PO LIQD
2.0000 mg | Freq: Three times a day (TID) | ORAL | Status: DC
  Administered 2021-03-20 (×3): 2 mg
  Filled 2021-03-20 (×3): qty 2

## 2021-03-20 MED ORDER — ROCURONIUM BROMIDE 50 MG/5ML IV SOLN
INTRAVENOUS | Status: AC
  Filled 2021-03-20: qty 1

## 2021-03-20 MED ORDER — GLUCOSE 40 % PO GEL: 2.0000 | ORAL | Status: AC

## 2021-03-20 MED ORDER — FENTANYL 2500MCG IN NS 250ML (10MCG/ML) PREMIX INFUSION
25.0000 ug/h | INTRAVENOUS | Status: DC
  Administered 2021-03-20: 75 ug/h via INTRAVENOUS
  Administered 2021-03-20: 100 ug/h via INTRAVENOUS
  Administered 2021-03-21: 75 ug/h via INTRAVENOUS
  Administered 2021-03-22: 100 ug/h via INTRAVENOUS
  Administered 2021-03-22: 150 ug/h via INTRAVENOUS
  Administered 2021-03-23: 125 ug/h via INTRAVENOUS
  Administered 2021-03-24 (×2): 200 ug/h via INTRAVENOUS
  Administered 2021-03-25: 300 ug/h via INTRAVENOUS
  Administered 2021-03-25: 225 ug/h via INTRAVENOUS
  Filled 2021-03-20 (×9): qty 250

## 2021-03-20 MED ORDER — FENTANYL BOLUS VIA INFUSION
25.0000 ug | INTRAVENOUS | Status: DC | PRN
  Administered 2021-03-20: 50 ug via INTRAVENOUS
  Administered 2021-03-20 – 2021-03-23 (×9): 100 ug via INTRAVENOUS
  Filled 2021-03-20: qty 100

## 2021-03-20 MED ORDER — INSULIN ASPART 100 UNIT/ML IJ SOLN
0.0000 [IU] | INTRAMUSCULAR | Status: DC
  Administered 2021-03-20 – 2021-03-21 (×3): 2 [IU] via SUBCUTANEOUS
  Administered 2021-03-21 (×3): 5 [IU] via SUBCUTANEOUS
  Administered 2021-03-22 (×2): 3 [IU] via SUBCUTANEOUS
  Administered 2021-03-22: 5 [IU] via SUBCUTANEOUS
  Administered 2021-03-22 (×3): 2 [IU] via SUBCUTANEOUS
  Administered 2021-03-22: 5 [IU] via SUBCUTANEOUS
  Administered 2021-03-23: 3 [IU] via SUBCUTANEOUS
  Administered 2021-03-23 (×3): 5 [IU] via SUBCUTANEOUS
  Filled 2021-03-20 (×16): qty 1

## 2021-03-20 MED ORDER — FENTANYL 2500MCG IN NS 250ML (10MCG/ML) PREMIX INFUSION
INTRAVENOUS | Status: AC
  Filled 2021-03-20: qty 250

## 2021-03-20 MED ORDER — FOLIC ACID 1 MG PO TABS
1.0000 mg | ORAL_TABLET | Freq: Every day | ORAL | Status: DC
  Administered 2021-03-20 – 2021-03-25 (×6): 1 mg
  Filled 2021-03-20 (×6): qty 1

## 2021-03-20 MED ORDER — HYDROCOD POLST-CPM POLST ER 10-8 MG/5ML PO SUER: 5.0000 mL | Freq: Two times a day (BID) | ORAL | Status: DC

## 2021-03-20 MED ORDER — POTASSIUM CHLORIDE 10 MEQ/100ML IV SOLN
10.0000 meq | INTRAVENOUS | Status: AC
  Administered 2021-03-20 (×4): 10 meq via INTRAVENOUS
  Filled 2021-03-20 (×4): qty 100

## 2021-03-20 MED ORDER — METOPROLOL TARTRATE 5 MG/5ML IV SOLN
INTRAVENOUS | Status: AC
  Filled 2021-03-20: qty 5

## 2021-03-20 MED ORDER — PHENYLEPHRINE CONCENTRATED 100MG/250ML (0.4 MG/ML) INFUSION SIMPLE
0.0000 ug/min | INTRAVENOUS | Status: DC
  Administered 2021-03-20 – 2021-03-23 (×2): 20 ug/min via INTRAVENOUS
  Administered 2021-03-25 (×4): 400 ug/min via INTRAVENOUS
  Filled 2021-03-20 (×6): qty 250

## 2021-03-20 MED ORDER — FREE WATER
60.0000 mL | Status: DC
  Administered 2021-03-20 – 2021-03-25 (×28): 60 mL

## 2021-03-20 MED ORDER — PANTOPRAZOLE SODIUM 40 MG IV SOLR
40.0000 mg | INTRAVENOUS | Status: DC
  Administered 2021-03-20 – 2021-03-25 (×6): 40 mg via INTRAVENOUS
  Filled 2021-03-20 (×6): qty 40

## 2021-03-20 MED ORDER — FENTANYL CITRATE (PF) 100 MCG/2ML IJ SOLN: 25.0000 ug | Freq: Once | INTRAMUSCULAR | Status: DC

## 2021-03-20 MED ORDER — MAGNESIUM SULFATE 2 GM/50ML IV SOLN
2.0000 g | Freq: Once | INTRAVENOUS | Status: AC
  Administered 2021-03-20: 2 g via INTRAVENOUS
  Filled 2021-03-20: qty 50

## 2021-03-20 MED ORDER — ASCORBIC ACID 500 MG PO TABS
500.0000 mg | ORAL_TABLET | Freq: Every day | ORAL | Status: DC
  Administered 2021-03-20: 500 mg
  Filled 2021-03-20: qty 1

## 2021-03-20 MED ORDER — FENTANYL CITRATE (PF) 100 MCG/2ML IJ SOLN
INTRAMUSCULAR | Status: AC
  Filled 2021-03-20: qty 2

## 2021-03-20 MED ORDER — EZETIMIBE 10 MG PO TABS
10.0000 mg | ORAL_TABLET | Freq: Every day | ORAL | Status: DC
  Administered 2021-03-20 – 2021-03-25 (×6): 10 mg
  Filled 2021-03-20 (×6): qty 1

## 2021-03-20 MED ORDER — POTASSIUM CHLORIDE 20 MEQ PO PACK
40.0000 meq | PACK | Freq: Once | ORAL | Status: AC
  Administered 2021-03-20: 40 meq
  Filled 2021-03-20: qty 2

## 2021-03-20 MED ORDER — POLYETHYLENE GLYCOL 3350 17 G PO PACK
17.0000 g | PACK | Freq: Every day | ORAL | Status: DC
  Administered 2021-03-20 – 2021-03-24 (×4): 17 g
  Filled 2021-03-20 (×4): qty 1

## 2021-03-20 MED ORDER — HYDROMORPHONE HCL 1 MG/ML PO LIQD: 2.0000 mg | Freq: Three times a day (TID) | ORAL | Status: DC

## 2021-03-20 MED ORDER — ADULT MULTIVITAMIN W/MINERALS CH
1.0000 | ORAL_TABLET | Freq: Every day | ORAL | Status: DC
  Administered 2021-03-20 – 2021-03-25 (×6): 1
  Filled 2021-03-20 (×6): qty 1

## 2021-03-20 MED ORDER — PROPOFOL 1000 MG/100ML IV EMUL
0.0000 ug/kg/min | INTRAVENOUS | Status: DC
  Administered 2021-03-20: 10 ug/kg/min via INTRAVENOUS
  Administered 2021-03-21: 15 ug/kg/min via INTRAVENOUS
  Filled 2021-03-20 (×2): qty 100

## 2021-03-20 MED ORDER — GLUCOSE 4 G PO CHEW
CHEWABLE_TABLET | ORAL | Status: AC
  Filled 2021-03-20: qty 1

## 2021-03-20 MED ORDER — AMIODARONE HCL IN DEXTROSE 360-4.14 MG/200ML-% IV SOLN
INTRAVENOUS | Status: AC
  Filled 2021-03-20: qty 200

## 2021-03-20 MED ORDER — ZINC SULFATE 220 (50 ZN) MG PO CAPS
220.0000 mg | ORAL_CAPSULE | Freq: Every day | ORAL | Status: DC
  Administered 2021-03-20 – 2021-03-25 (×6): 220 mg
  Filled 2021-03-20 (×6): qty 1

## 2021-03-20 MED ORDER — DOCUSATE SODIUM 50 MG/5ML PO LIQD
100.0000 mg | Freq: Two times a day (BID) | ORAL | Status: DC
  Administered 2021-03-20 – 2021-03-25 (×9): 100 mg
  Filled 2021-03-20 (×9): qty 10

## 2021-03-20 MED ORDER — AMIODARONE IV BOLUS ONLY 150 MG/100ML
INTRAVENOUS | Status: AC
  Filled 2021-03-20: qty 100

## 2021-03-20 MED ORDER — NOREPINEPHRINE 4 MG/250ML-% IV SOLN
INTRAVENOUS | Status: AC
  Filled 2021-03-20: qty 250

## 2021-03-20 MED ORDER — METHYLPREDNISOLONE SODIUM SUCC 40 MG IJ SOLR
40.0000 mg | Freq: Two times a day (BID) | INTRAMUSCULAR | Status: DC
  Administered 2021-03-20 – 2021-03-21 (×3): 40 mg via INTRAVENOUS
  Filled 2021-03-20 (×3): qty 1

## 2021-03-20 NOTE — Progress Notes (Signed)
   03/20/21 0009  Assess: MEWS Score  Pulse Rate (!) 116  SpO2 91 %  O2 Device Nasal Cannula  O2 Flow Rate (L/min) 4 L/min  Assess: MEWS Score  MEWS Temp 0  MEWS Systolic 0  MEWS Pulse 2  MEWS RR 2  MEWS LOC 0  MEWS Score 4  MEWS Score Color Red  Assess: if the MEWS score is Yellow or Red  Were vital signs taken at a resting state? Yes  Focused Assessment No change from prior assessment  Early Detection of Sepsis Score *See Row Information* Low  MEWS guidelines implemented *See Row Information* No, vital signs rechecked  Treat  MEWS Interventions Administered prn meds/treatments;Escalated (See documentation below)  Escalate  MEWS: Escalate Yellow: discuss with charge nurse/RN and consider discussing with provider and RRT  Notify: Charge Nurse/RN  Name of Charge Nurse/RN Notified Olivia  Date Charge Nurse/RN Notified 03/20/21  Time Charge Nurse/RN Notified 0010  Notify: Provider  Provider Name/Title Judd Gaudier  Date Provider Notified 03/20/21  Time Provider Notified 0009  Notification Type Page  Notification Reason Change in status  Provider response En route  Date of Provider Response 03/20/21  Time of Provider Response 0022  Notify: Rapid Response  Name of Rapid Response RN Notified Madelon Lips  Date Rapid Response Notified 03/20/21  Time Rapid Response Notified 0009  Document  Patient Outcome Transferred/level of care increased  Progress note created (see row info) Yes

## 2021-03-20 NOTE — Progress Notes (Addendum)
Cross coverage PROGRESS NOTE    Donna Brennan  XVQ:[id number] DOB: May 11, 1946 DOA: 06/28/2022e.)   Brief Narrative: Donna Brennan is a 75 y.o. 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  She was admitted to the hospital for severe sepsis secondary to COVID-19 pneumonia, with probable super bacterial infection, complicated by acute hypoxic respiratory failure and AKI.    Overnight events: On the night of 6/14-15 patient became increasingly short of breath, with intractable cough, persistent sinus tachycardia to the 120s 130s, and tachypnea to the 30s.  Patient was initially on O2 at 2 L and did not improve with up titration of O2 , O2 sat on 5 L in the low 90s.  ABG on 5 L showed pH 7.5, PCO2 29 and PO2 64.  Chest x-rayShowed diffuse right lung airspace disease and airspace disease in the left lower lobe.  Findings compatible with pneumonia. Assessment & Plan: Worsening hypoxic respiratory failure of multifactorial etiology including COVID, with secondary bacterial infection, and underlying left lower lobe carcinoma - Transfer patient to stepdown for closer monitoring given risk for clinical deterioration -Continue O2 to keep sats 92% and above - Continue prior management  Called and spoke with daughter Cornelious Bryant to update her on the events.  Explained reason for transfer.  Opportunity given to ask questions and they were answered to her satisfaction.  We discussed CODE STATUS and patient remains a full code.  CRITICAL CARE Performed by: Athena Masse   Total critical care time 60 minutes  Critical care time was exclusive of separately billable procedures and treating other patients.  Critical care was necessary to treat or prevent imminent or life-threatening deterioration.  Critical care was time  spent personally by me on the following activities: development of treatment plan with patient and/or surrogate as well as nursing, discussions with consultants, evaluation of patient's response to treatment, examination of patient, obtaining history from patient or surrogate, ordering and performing treatments and interventions, ordering and review of laboratory studies, ordering and review of radiographic studies, pulse oximetry and re-evaluation of patient's condition.    Principal Problem:   Sepsis ([place]) Active Problems:   Hypertension   CKD stage 3 due to type 2 diabetes mellitus ([place])   Primary cancer of left lower lobe of lung (HCC)   Pneumonia due to COVID-19 virus   Acute respiratory failure due to COVID-19 [facility])   COPD with acute exacerbation (HCC)   AKI (acute kidney injury) ([place])  60   Objective: Vitals:   03/19/21 2122 03/19/21 2312 03/20/21 0009 03/20/21 0022  BP: (!) 149/81 (!) 157/70  (!) 165/90  Pulse: (!) 109 (!) 107 (!) 116 (!) 110  Resp: (!) 28 (!) 28  (!) 32  Temp: 98.9 F (37.2 C) 98.2 F (36.8 C)  97.6 F (36.4 C)  TempSrc: Oral Oral  Oral  SpO2: 92% 93% 91% 91%  Weight:      Height:        Intake/Output Summary (Last 24 hours) at 03/20/2021 0237 Last data filed at 03/19/2021 1745 Gross per 24 hour  Intake 1295.83 ml  Output --  Net 1295.83 ml   Filed Weights   03/17/2021 1211  Weight: 56.7 kg    Examination:  General exam: Chronically ill-appearing female mild to moderate respiratory distress, conversational dyspnea Respiratory system: Tachypneic with increased work of breathing 60speaking  in short sentences.  Coarse breath sounds throughout bilateral lung fields  cardiovascular system: Tachycardic heard, RRR. No JVD, murmurs, rubs, gallops or clicks. No pedal edema. Gastrointestinal system: Abdomen is nondistended, soft and nontender. No organomegaly or masses felt. Normal bowel sounds heard. Central nervous system: Alert and oriented. No focal  neurological deficits. Extremities: Symmetric 5 x 5 power. Skin: No rashes, lesions or ulcers Psychiatry: Judgement and insight appear normal. Mood & affect appropriate.     Data Reviewed: I have personally reviewed following labs and imaging studies  CBC: Recent Labs  Lab 03/18/2021 1225 03/17/21 0648 03/18/21 0521 03/19/21 0434  WBC 12.8* 3.7* 3.0* 3.2*  NEUTROABS 10.3* 3.1 2.3 2.8  HGB 9.8* 7.6* 7.6* 7.4*  HCT 30.6* 23.2* 23.0* 21.7*  MCV 102.7* 103.1* 101.3* 98.6  PLT 222 127* 142* 696*   Basic Metabolic Panel: Recent Labs  Lab 04/04/2021 1225 03/17/21 0648 03/18/21 0521 03/19/21 0434  NA 134* 137 137 141  K 4.5 3.6 3.2* 3.2*  CL 101 111 110 112*  CO2 19* 20* 21* 23  GLUCOSE 293* 224* 297* 265*  BUN 29* 28* 29* 29*  CREATININE 1.50* 1.07* 1.04* 1.03*  CALCIUM 8.4* 7.5* 7.6* 7.9*  MG  --  1.4* 1.5* 1.7  PHOS  --  3.3 3.0 2.1*   GFR: Estimated Creatinine Clearance: 35.9 mL/min (A) (by C-G formula based on SCr of 1.03 mg/dL (H)). Liver Function Tests: Recent Labs  Lab 04/02/2021 1225 03/17/21 0648 03/18/21 0521 03/19/21 0434  AST 39 26 26 42*  ALT 16 14 14 22   ALKPHOS 113 72 67 142*  BILITOT 0.9 0.4 0.4 0.4  PROT 8.3* 6.0* 5.9* 6.0*  ALBUMIN 2.3* 1.7* 1.6* 1.6*   No results for input(s): LIPASE, AMYLASE in the last 168 hours. No results for input(s): AMMONIA in the last 168 hours. Coagulation Profile: Recent Labs  Lab 03/28/2021 1225 03/17/21 0600  INR 1.2 1.4*   Cardiac Enzymes: No results for input(s): CKTOTAL, CKMB, CKMBINDEX, TROPONINI in the last 168 hours. BNP (last 3 results) No results for input(s): PROBNP in the last 8760 hours. HbA1C: No results for input(s): HGBA1C in the last 72 hours. CBG: Recent Labs  Lab 03/19/21 0931 03/19/21 1200 03/19/21 1648 03/19/21 2118 03/19/21 2203  GLUCAP 184* 213* 108* 66* 90   Lipid Profile: No results for input(s): CHOL, HDL, LDLCALC, TRIG, CHOLHDL, LDLDIRECT in the last 72 hours. Thyroid Function  Tests: No results for input(s): TSH, T4TOTAL, FREET4, T3FREE, THYROIDAB in the last 72 hours. Anemia Panel: Recent Labs    03/18/21 0521 03/19/21 0434 03/19/21 1016  VITAMINB12  --   --  3,256*  FERRITIN 1,120* 1,155*  --   TIBC  --   --  175*  IRON  --   --  24*   Sepsis Labs: Recent Labs  Lab 04/02/2021 1225 03/07/2021 1226 03/20/2021 1408 03/18/21 0521  PROCALCITON  --  0.56  --  0.49  LATICACIDVEN 4.8*  --  1.4  --     Recent Results (from the past 240 hour(s))  Culture, blood (Routine x 2)     Status: None (Preliminary result)   Collection Time: 03/06/2021 12:25 PM   Specimen: BLOOD  Result Value Ref Range Status   Specimen Description BLOOD LEFT [facility]  Final   Special Requests   Final    BOTTLES DRAWN AEROBIC AND ANAEROBIC Blood Culture adequate volume   Culture   Final    NO GROWTH 3 DAYS Performed at [facility], [address] [place], [place] [address]    Report Status PENDING  Incomplete  Culture, blood (Routine x 2)     Status: None (Preliminary result)   Collection Time: 03/23/2021 12:25 PM   Specimen: BLOOD  Result Value Ref Range Status   Specimen Description BLOOD RIGHT [facility]  Final   Special Requests   Final    BOTTLES DRAWN AEROBIC AND ANAEROBIC Blood Culture adequate volume   Culture   Final    NO GROWTH 3 DAYS Performed at [facility], [address] [place], [place] [address]    Report Status PENDING  Incomplete  Resp Panel by RT-PCR (Flu A&B, Covid) Nasopharyngeal Swab     Status: Abnormal   Collection Time: 04/04/2021 12:41 PM   Specimen: Nasopharyngeal Swab; Nasopharyngeal(NP) swabs in vial transport medium  Result Value Ref Range Status   SARS Coronavirus 2 by RT PCR POSITIVE (A) NEGATIVE Final    Comment: RESULT CALLED TO, READ BACK BY AND VERIFIED WITH: RUDD,S AT 1349 ON 03/10/2021 BY MOSLEY,J (NOTE) SARS-CoV-2 target nucleic acids are DETECTED.  The SARS-CoV-2 RNA is generally detectable in upper respiratory specimens during  the acute phase of infection. Positive results are indicative of the presence of the identified virus, but do not rule out bacterial infection or co-infection with other pathogens not detected by the test. Clinical correlation with patient history and other diagnostic information is necessary to determine patient infection status. The expected result is Negative.  Fact Sheet for Patients: EntrepreneurPulse.com.au  Fact Sheet for Healthcare Providers: IncredibleEmployment.be  This test is not yet approved or cleared by the [place] FDA and  has been authorized for detection and/or diagnosis of SARS-CoV-2 by FDA under an Emergency Use Authorization (EUA).  This EUA will remain in effect (meaning this test  can be used) for the duration of  the COVID-19 declaration under Section 564(b)(1) of the Act, 21 U.S.C. section 360bbb-3(b)(1), unless the authorization is terminated or revoked sooner.     Influenza A by PCR NEGATIVE NEGATIVE Final   Influenza B by PCR NEGATIVE NEGATIVE Final    Comment: (NOTE) The Xpert Xpress SARS-CoV-2/FLU/RSV plus assay is intended as an aid in the diagnosis of influenza from Nasopharyngeal swab specimens and should not be used as a sole basis for treatment. Nasal washings and aspirates are unacceptable for Xpert Xpress SARS-CoV-2/FLU/RSV testing.  Fact Sheet for Patients: EntrepreneurPulse.com.au  Fact Sheet for Healthcare Providers: IncredibleEmployment.be  This test is not yet approved or cleared by the [place] FDA and has been authorized for detection and/or diagnosis of SARS-CoV-2 by FDA under an Emergency Use Authorization (EUA). This EUA will remain in effect (meaning this test can be used) for the duration of the COVID-19 declaration under Section 564(b)(1) of the Act, 21 U.S.C. section 360bbb-3(b)(1), unless the authorization is terminated  or revoked.  Performed at [facility], [address], [place], [place] [address]          Radiology Studies: DG Chest [place] 1 View  Result Date: 03/20/2021 CLINICAL DATA:  Shortness of breath, COVID EXAM: PORTABLE CHEST 1 VIEW COMPARISON:  03/12/2021 FINDINGS: Airspace disease throughout the right lung and in the left lower lobe compatible with pneumonia. Heart is normal size. No effusions or pneumothorax. No acute bony abnormality. Right Port-A-Cath remains in stable position with the tip in the SVC. IMPRESSION: Diffuse right lung airspace disease and airspace disease in the left lower lobe. Findings compatible with pneumonia. Electronically Signed   By:  Rolm Baptise M.D.   On: 03/20/2021 01:02        Scheduled Meds:  (feeding supplement) PROSource Plus  30 mL Oral BID BM   sodium chloride   Intravenous Once   vitamin C  500 mg Oral Daily   Chlorhexidine Gluconate Cloth  6 each Topical Daily   enoxaparin (LOVENOX) injection  40 mg Subcutaneous Q24H   ezetimibe  10 mg Oral Daily   feeding supplement (KATE FARMS STANDARD 1.4)  325 mL Oral Daily   folic acid  1 mg Oral Daily   insulin aspart  0-15 Units Subcutaneous TID WC   insulin aspart  0-5 Units Subcutaneous QHS   insulin detemir  12 Units Subcutaneous BID   lidocaine-prilocaine  1 application Topical Once   megestrol  625 mg Oral Daily   mometasone-formoterol  2 puff Inhalation BID   multivitamin with minerals  1 tablet Oral Daily   pantoprazole  40 mg Oral Daily   phosphorus  500 mg Oral TID   predniSONE  50 mg Oral Daily   rosuvastatin  40 mg Oral QPM   vitamin B-12  1,000 mcg Oral Daily   zinc sulfate  220 mg Oral Daily   Continuous Infusions:  remdesivir 100 mg in NS 100 mL 100 mg (03/19/21 1123)     LOS: 4 days    Time spent: [place], MD [facility]

## 2021-03-20 NOTE — Progress Notes (Signed)
VAST consulted d/t inability to obtain blood return from port. According to documentation, port has been without blood return since 5/6. Chest x-ray was obtained this am which showed port needle is in proper position. Pt is currently receiving all her drips via port. Advised RN that patient needs to be able to tolerate a 3-4 hour period of no port use in order to TPA and declot port. Levada Dy, RN verbalized understanding.

## 2021-03-20 NOTE — Progress Notes (Signed)
CH received PG at 5:50am to provide support to pt.'s dtr. Rhonda at bedside.  Marvin entered rm. in COVID PPE --> Pt. intubated, sedated; Rhonda at bedside.  Dtr. shared that pt.'s husband tested positive for COVID last week; pt. also tested positive and has been undergoing cancer treatments that tend to weaken her; dtr. brought pt. to ED this weekend due to cough and trouble breathing.  She received a call this AM that pt. required transfer to ICU for higher level of care, and then second call saying pt. required ventilator for breathing support. Dtr. tearful at bedside expressing 'it's hard to see her like this.'  She feels that pt. has been getting good individualized care at [facility] cancer center but that medical team in hospital have tended to treat pt. according to their own protocols rather than attending to what has worked for pt. at the [place] center.  Dtr. is CMA at [facility] clinic and is seeking to advocate for her mother; is curious when vent weaning trial might be attempted.  Dtr. says pt. is  'a strong woman' and that pt. consented to intubation (per phone report from medical team); Pt. has AD on file --> no indication that pt. is against life-prolonging treatment at time of signing.  Dtr. requested prayer for pt.'s healing: "I just want more time with her."  CH provided prayer, supportive presence, and active listening; dtr. says pt.'s other dtr. Phyllis from [facility] may visit later today.  Chaplains remain available as needed.

## 2021-03-20 NOTE — Consult Note (Signed)
NAME:  Donna Brennan, MRN:  [id number], DOB:  [birth year], LOS: 4 ADMISSION DATE:  03/26/2021, CONSULTATION DATE: 03/20/2021 REFERRING MD: Dr. 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 cholangio-/pancreaticobiliary).  She presented to [facility] ER from home on 06/11 with c/o weakness, productive cough, and shortness of breath.  The morning of 06/11 she tested positive for COVID via home test, she also tested positive for COVID on 06/6.  Pts husband COVID positive.  She has received 2 doses of COVID-19 vaccine, however she has not had a booster dose.     Upon arrival to the ER O2 sats were 85% on RA and pt noted to have increased work of breathing, therefore she was placed on 2L O2 via nasal canula.  CXR revealed chronic patchy density over the left mid to lower lung concerning for atelectasis vs. early superimposed infection.  CTA Chest negative for pulmonary embolism but concerning for COVID-19 pneumonia and chronic infection or aspiration.  She received iv solumedrol, empiric iv abx (rocephin and azithromycin), and remdesivir per protocol.  She was subsequently admitted to the [facility] unit per hospitalist team for additional workup and treatment.    Hospital course complicated by worsening acute hypoxic respiratory failure requiring pt transfer to ICU for emergent intubation on 06/15.    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 cholangio-/pancreaticobiliary) COPD Stage IV CKD Colon Polyps  Type II Diabetes Mellitus GERD Headache  Hepatitis  HLD IBS Tobacco Abuse Abuse  Vertigo  Significant Hospital Events: Including procedures, antibiotic start and stop dates in addition to other pertinent events   06/11: Pt admitted to the medsurg unit  06/11~06/15:  Azithromycin  06/11~06/13: Ceftriaxone  06/15: Pt transferred to ICU requiring emergent intubation per PCCM team  Interim History / Subjective:  Pt with severe respiratory distress requiring emergent mechanical intubation   Objective   Blood pressure (!) 165/90, pulse (!) 110, temperature 97.6 F (36.4 C), temperature source Oral, resp. rate (!) 32, height 5' 1.5" (1.562 m), weight 56.7 kg, SpO2 97 %.    Vent Mode: PRVC FiO2 (%):  [100 %] 100 % Set Rate:  [25 bmp] 25 bmp Vt Set:  [450 mL] 450 mL PEEP:  [10 cmH20] 10 cmH20   Intake/Output Summary (Last 24 hours) at 03/20/2021 0923 Last data filed at 03/19/2021 1745 Gross per 24 hour  Intake 1295.83 ml  Output --  Net 1295.83 ml   Filed Weights   03/22/2021 1211  Weight: 56.7 kg    Examination: General: acutely ill appearing female, NAD mechanically intubated  HENT: supple, JVD present  Lungs: rhonchi throughout, even, non labored, cyanotic, mechanically intubated  Cardiovascular: sinus tach, no R/G, 2+ radial/1+ distal pulses, no edema  Abdomen: +BS x4, soft, non distended Extremities: normal bulk and tone, cyanotic  Neuro: sedated, not following commands, PERRL GU: purewick in place   Labs/imaging that I havepersonally reviewed  (right click and "Reselect all SmartList Selections" daily)  06/11: CTA Chest revealed no evidence of pulmonary embolism. Development of multifocal peripheral predominant ground-glass opacities, highly suspicious for COVID-19 pneumonia. Left lower lobe pulmonary opacities are slightly progressive and likely represent a combination of COVID-19 pneumonia and chronic infection or aspiration. Similar appearance of probable radiation induced consolidation within the lingula and anterior left lower lobe. Anterior left lower  lobe nodularity is similar. Subcarinal adenopathy, mild and new since 02/18/2021. Most likely reactive. Metastatic disease could look similar. Recommend attention on follow-up. Coronary  artery atherosclerosis. Aortic Atherosclerosis (ICD10-I70.0). Right hepatic lobe metastasis, suboptimally evaluated secondary to bolus timing. 06/15: ABG~pH 7.42/pCO2 33/pO2 76/acid-base deficit 2.4; d-dimer 9.06 06/15: CXR~worsening airspace disease and/or edema   Resolved Hospital Problem list   N/A  Assessment & Plan:  Acute on chronic hypoxic respiratory failure secondary COVID-19 pneumonia with suspected bacterial infection vs. pulmonary edema further complicated by COPD Mechanical ventilation via ARDS protocol, target PRVC 6 cc/kg Wean PEEP and FiO2 as able to maintain O2 sats >88% Goal plateau pressure less than 30, driving pressure less than 15 Paralytics if necessary for vent synchrony, gas exchange Cycle prone positioning if necessary for oxygenation Deep sedation per PAD protocol, goal RASS -3, currently fentanyl and propofol  Diuresis as blood pressure and renal function can tolerate, goal CVP 5-8.  BNP pending  VAP prevention order set Remdesivir, plan for 5 days Steroids Follow inflammatory markers: Ferritin, D-dimer, CRP, IL-6, LDH Vitamin C, zinc Will check resp cultures  Will check pct if elevated will started cefepime  Sinus tachycardia  Continuous telemetry monitoring Will check troponin   Hypokalemia  Hypomagnesia Trend BMP  Replace electrolytes as indicated  Monitor UOP Avoid nephrotoxic medications   Pancytopenia likely secondary to stage IV poorly differentiated adenocarcinoma with unclear primary (suspected to be lung vs. hepatobiliary) Trend CBC Monitor for s/sx of bleeding and transfuse for hgb <7  Type II diabetes mellitus  CBG q4hrs SSI   GERD Scheduled IV protonix   Best practice (right click and "Reselect all SmartList Selections" daily)  Diet:  NPO Pain/Anxiety/Delirium protocol (if indicated): Yes (RASS goal -3) VAP protocol (if indicated): Yes DVT prophylaxis: LMWH GI prophylaxis: PPI Glucose control:  SSI Yes Central venous  access: Right chest portacath  Arterial line:  N/A Foley:  N/A Mobility:  bed rest  PT consulted: N/A Last date of multidisciplinary goals of care discussion [N/A] Code Status:  full code Disposition: ICU  Labs   CBC: Recent Labs  Lab 03/10/2021 1225 03/17/21 0648 03/18/21 0521 03/19/21 0434  WBC 12.8* 3.7* 3.0* 3.2*  NEUTROABS 10.3* 3.1 2.3 2.8  HGB 9.8* 7.6* 7.6* 7.4*  HCT 30.6* 23.2* 23.0* 21.7*  MCV 102.7* 103.1* 101.3* 98.6  PLT 222 127* 142* 132*    Basic Metabolic Panel: Recent Labs  Lab 03/15/2021 1225 03/17/21 0648 03/18/21 0521 03/19/21 0434  NA 134* 137 137 141  K 4.5 3.6 3.2* 3.2*  CL 101 111 110 112*  CO2 19* 20* 21* 23  GLUCOSE 293* 224* 297* 265*  BUN 29* 28* 29* 29*  CREATININE 1.50* 1.07* 1.04* 1.03*  CALCIUM 8.4* 7.5* 7.6* 7.9*  MG  --  1.4* 1.5* 1.7  PHOS  --  3.3 3.0 2.1*   GFR: Estimated Creatinine Clearance: 35.9 mL/min (A) (by C-G formula based on SCr of 1.03 mg/dL (H)). Recent Labs  Lab 03/15/2021 1225 03/06/2021 1226 03/15/2021 1408 03/17/21 0648 03/18/21 0521 03/19/21 0434  PROCALCITON  --  0.56  --   --  0.49  --   WBC 12.8*  --   --  3.7* 3.0* 3.2*  LATICACIDVEN 4.8*  --  1.4  --   --   --     Liver Function Tests: Recent Labs  Lab 04/01/2021 1225 03/17/21 0648 03/18/21 0521 03/19/21 0434  AST 39 26 26 42*  ALT 16 14 14 22   ALKPHOS 113 72  67 142*  BILITOT 0.9 0.4 0.4 0.4  PROT 8.3* 6.0* 5.9* 6.0*  ALBUMIN 2.3* 1.7* 1.6* 1.6*   No results for input(s): LIPASE, AMYLASE in the last 168 hours. No results for input(s): AMMONIA in the last 168 hours.  ABG    Component Value Date/Time   PHART 7.50 (H) 03/20/2021 0043   PCO2ART 29 (L) 03/20/2021 0043   PO2ART 64 (L) 03/20/2021 0043   HCO3 22.6 03/20/2021 0043   O2SAT 94.0 03/20/2021 0043     Coagulation Profile: Recent Labs  Lab 03/17/2021 1225 03/17/21 0600  INR 1.2 1.4*    Cardiac Enzymes: No results for input(s): CKTOTAL, CKMB, CKMBINDEX, TROPONINI in the last 168  hours.  HbA1C: No results found for: HGBA1C  CBG: Recent Labs  Lab 03/19/21 2203 03/20/21 0251 03/20/21 0306 03/20/21 0323 03/20/21 0423  GLUCAP 90 54* 55* 78 140*    Review of Systems:   Pt mechanically intubated   Past Medical History:  She,  has a past medical history of CKD (chronic kidney disease), stage IV ([place]), Colon polyps, COPD (chronic obstructive pulmonary disease) ([place]), Diabetes mellitus without complication ([place]), GERD (gastroesophageal reflux disease), Headache, Hepatitis, Hyperlipidemia, IBS (irritable bowel syndrome), Peptic ulcer, Tobacco abuse, Vertigo, and Wears dentures.   Surgical History:   Past Surgical History:  Procedure Laterality Date   ABDOMINAL HYSTERECTOMY     carbuncle removal     urethra   CATARACT EXTRACTION W/PHACO Left 10/29/2020   Procedure: CATARACT EXTRACTION PHACO AND INTRAOCULAR LENS PLACEMENT (IOC) LEFT DIABETIC 1051 01:00.0 ;  Surgeon: Eulogio Bear, MD;  Location: [place];  Service: Ophthalmology;  Laterality: Left;  Diabetic - insulin   CATARACT EXTRACTION W/PHACO Right 11/19/2020   Procedure: CATARACT EXTRACTION PHACO AND INTRAOCULAR LENS PLACEMENT (IOC) RIGHT DIABETIC;  Surgeon: Eulogio Bear, MD;  Location: [place];  Service: Ophthalmology;  Laterality: Right;  16.54 1:40.0   COLONOSCOPY     COLONOSCOPY WITH PROPOFOL N/A 11/05/2017   Procedure: COLONOSCOPY WITH PROPOFOL;  Surgeon: Lollie Sails, MD;  Location: [facility] ENDOSCOPY;  Service: Endoscopy;  Laterality: N/A;   ESOPHAGOGASTRODUODENOSCOPY     ESOPHAGOGASTRODUODENOSCOPY (EGD) WITH PROPOFOL N/A 11/05/2017   Procedure: ESOPHAGOGASTRODUODENOSCOPY (EGD) WITH PROPOFOL;  Surgeon: Lollie Sails, MD;  Location: [facility] ENDOSCOPY;  Service: Endoscopy;  Laterality: N/A;   IR IMAGING GUIDED PORT INSERTION  12/13/2020   IR IMAGING GUIDED PORT INSERTION  01/25/2021   VIDEO BRONCHOSCOPY WITH ENDOBRONCHIAL NAVIGATION N/A 12/02/2019   Procedure: VIDEO  BRONCHOSCOPY WITH ENDOBRONCHIAL NAVIGATION;  Surgeon: Ottie Glazier, MD;  Location: [facility]S;  Service: Thoracic;  Laterality: N/A;   VIDEO BRONCHOSCOPY WITH ENDOBRONCHIAL ULTRASOUND N/A 12/02/2019   Procedure: VIDEO BRONCHOSCOPY WITH ENDOBRONCHIAL ULTRASOUND;  Surgeon: Ottie Glazier, MD;  Location: [facility] OR[facility];  Service: Thoracic;  Laterality: N/A;     Social History:   reports that she has been smoking cigarettes. She has a 11.25 pack-year smoking history. She has never used smokeless tobacco. She reports that she does not drink alcohol and does not use drugs.   Family History:  Her family history is negative for Breast cancer.   Allergies Allergies  Allergen Reactions   Penicillins Anaphylaxis and Rash    Did it involve swelling of the face/tongue/throat, SOB, or low BP? Yes Did it involve sudden or severe rash/hives, skin peeling, or any reaction on the inside of your mouth or nose? Yes Did you need to seek medical attention at a hospital or doctor's office? Yes When did it  last happen?More than 40 years ago If all above answers are "NO", may proceed with cephalosporin use.    Metformin Other (See Comments)    Upset stomach   Sulfa Antibiotics Nausea And Vomiting     Home Medications  Prior to Admission medications   Medication Sig Start Date End Date Taking? Authorizing Provider  acetaminophen (TYLENOL) 500 MG tablet Take 1,000 mg by mouth every 6 (six) hours as needed for mild pain or moderate pain.   Yes [provider]  albuterol (VENTOLIN HFA) 108 (90 Base) MCG/ACT inhaler Inhale 1-2 puffs into the lungs every 6 (six) hours as needed for wheezing or shortness of breath. 02/10/20  Yes Cammie Sickle, MD  chlorpheniramine-HYDROcodone (TUSSIONEX) 10-8 MG/5ML SUER Take 5 mLs by mouth at bedtime as needed for cough. Patient taking differently: Take 5 mLs by mouth 3 (three) times daily as needed for cough. 02/08/21  Yes Cammie Sickle, MD  diazepam (VALIUM) 5 MG  tablet Take 5 mg by mouth daily as needed (for vertigo).   Yes [provider]  ezetimibe (ZETIA) 10 MG tablet Take 10 mg by mouth daily.    Yes [provider]  Fluticasone-Salmeterol (ADVAIR) 250-50 MCG/DOSE AEPB Inhale 1 puff into the lungs 2 (two) times daily.   Yes [provider]  folic acid (FOLVITE) 1 MG tablet Take 1 tablet (1 mg total) by mouth daily. 12/14/20  Yes Cammie Sickle, MD  hyoscyamine (LEVBID) 0.375 MG 12 hr tablet Take 0.375 mg by mouth daily as needed (abdominal cramping).  03/24/19  Yes [provider]  Ipratropium-Albuterol (COMBIVENT) 20-100 MCG/ACT AERS respimat Inhale 1 puff into the lungs every 4 (four) hours as needed for wheezing.   Yes [provider]  ipratropium-albuterol (DUONEB) 0.5-2.5 (3) MG/3ML SOLN Take 3 mLs by nebulization every 6 (six) hours as needed (wheezing). 12/28/20  Yes Verlon Au, NP  LANTUS SOLOSTAR 100 UNIT/ML Solostar Pen Inject 12-20 Units into the skin See admin instructions. Inject 12u under the skin every morning and inject 20u under the skin at bedtime   Yes [provider]  lidocaine-prilocaine (EMLA) cream Apply 1 application topically as needed. 12/05/20  Yes Cammie Sickle, MD  megestrol (MEGACE ES) 625 MG/5ML suspension Take 5 mLs (625 mg total) by mouth daily. 02/25/21  Yes Cammie Sickle, MD  Multiple Vitamin (MULTIVITAMIN WITH MINERALS) TABS tablet Take 1 tablet by mouth daily. One-A-Day for Women   Yes [provider]  nystatin (MYCOSTATIN) 100000 UNIT/ML suspension Take 5 mLs by mouth 2 (two) times daily as needed (throat irritation.).   Yes [provider]  omeprazole (PRILOSEC) 20 MG capsule Take 20 mg by mouth daily before breakfast.    Yes [provider]  ondansetron (ZOFRAN) 8 MG tablet Take 1 tablet (8 mg total) by mouth every 8 (eight) hours as needed for nausea or vomiting. 12/05/20  Yes Cammie Sickle, MD   prochlorperazine (COMPAZINE) 10 MG tablet Take 1 tablet (10 mg total) by mouth every 6 (six) hours as needed for nausea or vomiting. 12/05/20  Yes Cammie Sickle, MD  rosuvastatin (CRESTOR) 40 MG tablet Take 40 mg by mouth every evening.    Yes [provider]  vitamin B-12 (CYANOCOBALAMIN) 1000 MCG tablet Take 1,000 mcg by mouth daily.   Yes [provider]     Critical care time: 1 hour      -Updated pts daughter Hassie Bruce via telephone informed her of decline in  pts condition requiring mechanical intubation.  All questions were answered. Donna Brennan was appreciative of the update.  Will consult Palliative Care due to poor prognosis.   Rosilyn Mings, AGNP  Pulmonary/Critical Care Pager [phone] (please enter 7 digits) PCCM Consult Pager [phone] (please enter 7 digits)

## 2021-03-20 NOTE — Procedures (Signed)
Endotracheal Intubation: Patient required placement of an artificial airway secondary to acute hypoxic respiratory failure    Consent: Emergent.    Hand washing performed prior to starting the procedure.    Medications administered for sedation prior to procedure:  Fentanyl 100 mcg IV, 20 mg Etomidate IV, Rocuronium 50 mg IV     A time out procedure was called and correct patient, name, & ID confirmed. Needed supplies and equipment were assembled and checked to include ETT, 10 ml syringe, Glidescope, [place] and [place] blades, suction, oxygen and bag mask valve, end tidal CO2 monitor.    Patient was positioned to align the mouth and pharynx to facilitate visualization of the glottis.    Heart rate, SpO2 and blood pressure was continuously monitored during the procedure. Pre-oxygenation was conducted prior to intubation and endotracheal tube was placed through the vocal cords into the trachea.       The artificial airway was placed under direct visualization via glidescope route using a 7.5 cm  ETT on the first attempt.   ETT was secured at 23 cm.   Placement was confirmed by auscuitation of lungs with good breath sounds bilaterally and no stomach sounds.  Condensation was noted on endotracheal tube.   Pulse ox 93% CO2 detector in place with appropriate color change.    Complications: None .        Chest radiograph ordered and pending.   Rosilyn Mings, AGNP  Pulmonary/Critical Care Pager [phone] (please enter 7 digits) PCCM Consult Pager [phone] (please enter 7 digits)

## 2021-03-20 NOTE — Progress Notes (Signed)
0030  ICU charge made aware of patients increased WOB. Upon arrival to bedside patient SOB at rest and wheezing. Patient maintaing 02 at 90% on 4L. Patient given breathing treatment prior to ICU charge nurses arrival and had just gotten back from the bathroom  0045 Dr Damita Dunnings orders Chest Xray and ABG. Patient breathing becoming less labored. Patient able Alert and oriented able to communicate in full sentences. Patient expresses she feels better   0130 Dr Damita Dunnings at bedside. Patient placed on 9L to help with ventilation. Dr.duncan wants to set patient up to transfer to [facility]

## 2021-03-20 NOTE — Assessment & Plan Note (Addendum)
#  75 year old female patient with metastatic poorly differentiated adenocarcinoma [lung versus pancreaticobiliary]-is currently admitted to hospital for [place] pneumonia/noted to have acute respiratory failure.  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  See below  #Acute on chronic respiratory failure-multifactorial-COVID infection/superimposed bacterial pneumonia/underlying COPD emphysema-s/p intubation ; on three pressors -currently on 100 FiO2.  Significant decline in respiratory status-see below  Recommendation/plan:  #I had a long discussion with the patient's daughter [name]; and the patient's husband regarding the significant decline in the patient's clinical status over the last 2 to 3 days.  She is currently maximally supported on the ventilator and also blood pressures.  Discussed that given her multiple comorbidities including underlying incurable cancer//advanced COPD/also superimposed COVID infection/pneumonia would make her recovery/back to baseline impossible.  Daughter and husband expressed that patient would not have wanted on the ventilator/ICU without any significant chance of recovery.   They understand that patient will pass within hours of terminal extubation. Family in agreement with terminal extubation/comfort measures.  Also discussed with ICU nurse.  # 40 minutes face-to-face with the patient's family discussing the above plan of care; more than 50% of time spent on prognosis/ natural history; counseling and coordination.

## 2021-03-20 NOTE — Consult Note (Signed)
PHARMACY CONSULT NOTE  Pharmacy Consult for Electrolyte Monitoring and Replacement   Recent Labs: Potassium (mmol/L)  Date Value  03/20/2021 2.9 (L)   Magnesium (mg/dL)  Date Value  03/20/2021 1.7   Calcium (mg/dL)  Date Value  03/20/2021 7.7 (L)   Albumin (g/dL)  Date Value  03/20/2021 1.8 (L)   Phosphorus (mg/dL)  Date Value  03/20/2021 4.8 (H)   Sodium (mmol/L)  Date Value  03/20/2021 142    Assessment: Patient is a 75 y/o F with medical history including COPD, stage IV poorly differentiated adenocarcinoma on chemotherapy (parimary lung vs cholangio-/pancreaticobiliary), CKD, diabetes, GERD, IBS, hepatitis who is admitted with acute on chronic respiratory failure secondary to COVID-19 pneumonia / superimposed bacterial infection / pulmonary edema. Pharmacy consulted to assist with electrolyte monitoring and replacement as indicated.  Patient is currently intubated, sedated, and on MV in the ICU. Requiring vasopressors for BP support.   Goal of Therapy:  Electrolytes within normal limits  Plan:  --K 2.9, PO Kcl 40 mEq x 1 + IV Kcl 10 mEq x 4 --Mg 1.7, IV magnesium sulfate 2 g x 1 --Follow-up electrolytes in AM  Benita Gutter 03/20/2021 2:08 PM

## 2021-03-20 NOTE — Progress Notes (Signed)
Pt had HR >130s and was sustaining per CCMD. Assessment of pt found pt in bathroom without oxygen. Pt return to bed and was working to breathe. MD informed of pt HR. Respiratory was called but pt only had inhaler ordered. Pt MEWS went from yellow to red. ICU charge nurse, nursing supervisor and MD made aware. ICU charge nurse and MD came to the floor. Chest x-ray and ABG ordered. Pt was placed on high flow by RT. Pt seem calmer but still had elevated respirations. On assessment MD transferred pt to ICU. During shift pt BS dropped to 60s. Pt was given orange juice and BS came up to 90. Pt report she still takes her levemir even when her BS was low. Encourage pt to keep drinking orange juice so she does not bottom out. Per MD, she will update the family.

## 2021-03-20 NOTE — Progress Notes (Signed)
Inpatient Diabetes Program Recommendations  AACE/ADA: New Consensus Statement on Inpatient Glycemic Control   Target Ranges:  Prepandial:   less than 140 mg/dL      Peak postprandial:   less than 180 mg/dL (1-2 hours)      Critically ill patients:  140 - 180 mg/dL   Results for CALIANNE, LARUE (MRN [id number]) as of 03/20/2021 08:19  Ref. Range 03/20/2021 02:51 03/20/2021 03:06 03/20/2021 03:23 03/20/2021 04:23 03/20/2021 07:52  Glucose-Capillary Latest Ref Range: 70 - 99 mg/dL 54 (L) 55 (L) 78 140 (H) 60 (L)   Results for ROZANN, HOLTS (MRN [id number]) as of 03/20/2021 08:19  Ref. Range 03/19/2021 09:31 03/19/2021 12:00 03/19/2021 16:48 03/19/2021 21:18 03/19/2021 22:03  Glucose-Capillary Latest Ref Range: 70 - 99 mg/dL 184 (H) 213 (H) 108 (H) 66 (L) 90   Review of Glycemic Control  Diabetes history: DM2 Outpatient Diabetes medications: Lantus 12 units QAM, Lantus 20 units QHS Current orders for Inpatient glycemic control: Levemir 12 units BID, Novolog 0-15 units Q4H; Solumedrol 60 mg Q12H   Inpatient Diabetes Program Recommendations:     Insulin: If steroids are continued, please consider decreasing Levemir to 8 units BID.  Thanks, Barnie Alderman, RN, MSN, CDE Diabetes Coordinator Inpatient Diabetes Program [phone] (Team Pager from 8am to 5pm)

## 2021-03-20 NOTE — Progress Notes (Signed)
Nutrition Follow-up  DOCUMENTATION CODES:  Not applicable  INTERVENTION:  Initiate tube feeding via OGT (confirmed gastric with XR 6/15) Vital 1.2 at 55 ml/h (1320 ml per day) Free Water: 45mL q4h  TF+propofol = 1530 kcal/d Provides 1440kcal, 90gm protein, 1333 ml free water daily  Continue MVI with minerals via tube daily  NUTRITION DIAGNOSIS:  Increased nutrient needs related to acute illness, catabolic illness (ERXVQ-00 infection) as evidenced by estimated needs.  GOAL:  Provide needs based on ASPEN/SCCM guidelines  MONITOR:  Vent status, Labs, TF tolerance, I & O's  REASON FOR ASSESSMENT:  Malnutrition Screening Tool  ASSESSMENT:  75 y.o. female with medical history of DM, stage 3-4 CKD, COPD, lung cancer with mets, and GERD. She presented to the ED for evaluation of cough productive of yellow phlegm, worsening SOB, and weakness. She tested positive on home COVID-19 test on 6/11 AM. Her husband is confirmed to be COVID-19 positive. She tested positive for COVID-19 in the ED. She has had 2 COVID vaccines, but has not had the booster. CT angiogram of chest did not show pulmonary embolism.  Pt with increased WOB overnight and moved to the ICU. Required emergent intubation 6/15. OGT placed and confirmed with imaging.    Pt intubated and lightly sedated at the time of assessment. Did open her eyes during assessment to verbal stimuli.   Discussed in IDT rounds, per MD ok to start feeds. Hypoglycemic several times this AM. Pt requiring 1 pressor at this time but rate is weaning. Low dose propofol infusing. Will add TF regimen to meet ~80% of estimated needs, per COVID19 standards of care, and increase to goal if pt requires prolonged intubation in the ICU.  Noted pt followed by outpatient oncology RD, actively undergoing treatment. Likes chocolate [employer] supplements.   Patient is currently intubated on ventilator support MV: 11.3 L/min Temp (24hrs), Avg:98.8 F (37.1 C),  Min:97.6 F (36.4 C), Max:99.5 F (37.5 C)  Propofol: 3.69ml/hr (90 kcal/d)   Intake/Output Summary (Last 24 hours) at 03/20/2021 1150 Last data filed at 03/19/2021 1745 Gross per 24 hour  Intake 1295.83 ml  Output --  Net 1295.83 ml  Net IO Since Admission: 1,739.39 mL [03/20/21 1150]  Nutritionally Relevant Medications: Scheduled Meds:  vitamin C  500 mg Per Tube Daily   docusate  100 mg Per Tube BID   folic acid  1 mg Per Tube Daily   glucose       insulin aspart  0-15 Units Subcutaneous Q4H   insulin detemir  12 Units Subcutaneous BID   megestrol  625 mg Oral Daily   methylPREDNISolone (SOLU-MEDROL) injection  40 mg Intravenous Q12H   multivitamin with minerals  1 tablet Per Tube Daily   pantoprazole (PROTONIX) IV  40 mg Intravenous Q24H   polyethylene glycol  17 g Per Tube Daily   potassium chloride  40 mEq Per Tube Once   rosuvastatin  40 mg Per Tube QPM   vitamin B-12  1,000 mcg Per Tube Daily   zinc sulfate  220 mg Per Tube Daily   Continuous Infusions:  fentaNYL infusion INTRAVENOUS 100 mcg/hr (03/20/21 0908)   phenylephrine (NEO-SYNEPHRINE) Adult infusion 75 mcg/min (03/20/21 0900)   potassium chloride 10 mEq (03/20/21 0819)   propofol (DIPRIVAN) infusion 10 mcg/kg/min (03/20/21 0852)   remdesivir 100 mg in NS 100 mL 100 mg (03/19/21 1123)   PRN Meds: ondansetron  Labs Reviewed: K 2.9 Phosphorus 4.8 Ferritin 2324 SBG ranges from 55-170 mg/dL over the last  24 hours HgbA1c 7.5% (6/14)   NUTRITION - FOCUSED PHYSICAL EXAM: Flowsheet Row Most Recent Value  Orbital Region No depletion  Upper Arm Region No depletion  Thoracic and Lumbar Region No depletion  Buccal Region No depletion  Temple Region Mild depletion  Clavicle Bone Region No depletion  Clavicle and Acromion Bone Region Mild depletion  Scapular Bone Region No depletion  Dorsal Hand Unable to assess  [safety mittens]  Patellar Region No depletion  Anterior Thigh Region No depletion  Posterior  Calf Region No depletion  Edema (RD Assessment) Mild  Hair Reviewed  Eyes Reviewed  Mouth Unable to assess  Skin Reviewed  Nails Unable to assess   Diet Order:   Diet Order     None       EDUCATION NEEDS:  Not appropriate for education at this time  Skin:  Skin Assessment: Reviewed RN Assessment  Last BM:  6/15 - type 5  Height:  Ht Readings from Last 1 Encounters:  03/15/2021 5' 1.5" (1.562 m)   Weight:  Wt Readings from Last 1 Encounters:  03/12/2021 56.7 kg   Estimated Nutritional Needs:  Kcal:  1850-2050 kcal Protein:  95-110 grams Fluid:  >/= 2 L/day   Ranell Patrick, RD, LDN Clinical Dietitian Pager on [employer]

## 2021-03-20 NOTE — Progress Notes (Signed)
Donna Brennan   DOB:July 19, 1946   JJ#:[id number]    Subjective: Patient with progressive difficulty breathing overnight.  Patient is currently intubated; sedated.  She is on 2 pressors.    Objective:  Vitals:   03/20/21 2000 03/20/21 2057  BP:    Pulse:    Resp:    Temp: 97.9 F (36.6 C)   SpO2:  92%     Intake/Output Summary (Last 24 hours) at 03/20/2021 2123 Last data filed at 03/20/2021 1404 Gross per 24 hour  Intake 0 ml  Output 600 ml  Net -600 ml    Physical Exam Constitutional:      Comments: Patient a intubated sedated. daughter at the bedside.  HENT:     Head: Normocephalic and atraumatic.     Mouth/Throat:     Pharynx: No oropharyngeal exudate.  Eyes:     Pupils: Pupils are equal, round, and reactive to light.  Cardiovascular:     Rate and Rhythm: Normal rate and regular rhythm.  Pulmonary:     Effort: No respiratory distress.     Breath sounds: No wheezing.     Comments: Decreased breath sounds bilaterally.  Abdominal:     General: Bowel sounds are normal. There is no distension.     Palpations: Abdomen is soft. There is no mass.     Tenderness: There is no abdominal tenderness. There is no guarding or rebound.  Musculoskeletal:        General: No tenderness. Normal range of motion.     Cervical back: Normal range of motion and neck supple.  Skin:    General: Skin is warm.  Neurological:     Comments: Sedated  Psychiatric:        Mood and Affect: Affect normal.     Labs:  Lab Results  Component Value Date   WBC 8.1 03/20/2021   HGB 11.6 (L) 03/20/2021   HCT 33.8 (L) 03/20/2021   MCV 95.2 03/20/2021   PLT 152 03/20/2021   NEUTROABS 7.4 03/20/2021    Lab Results  Component Value Date   NA 142 03/20/2021   K 4.6 03/20/2021   CL 107 03/20/2021   CO2 21 (L) 03/20/2021    Studies:  DG Abd 1 View  Result Date: 03/20/2021 CLINICAL DATA:  ET and NG placement EXAM: PORTABLE CHEST - 1 VIEW; ABDOMEN - 1 VIEW COMPARISON:  CT a chest 03/24/2021, CT  chest, abdomen pelvis 02/19/2071, chest radiograph 03/20/2021 FINDINGS: Accessed right IJ approach Port-A-Cath tip terminates in the mid SVC. Endotracheal tube tip terminates in the lower trachea 2.3 cm in the carina. Consider retraction 2 cm to the mid trachea. Transesophageal tube tip terminates in the region of the distal gastric body with the side port distal to the GE junction. Telemetry leads and external support devices overlie the chest and abdomen Diffuse heterogeneous opacities again seen throughout both lungs, right greater than left with particular confluence in the right perihilar region. Some increasing left perihilar opacity is noted as well with fissural and septal thickening throughout both lungs and indistinct pulmonary vascularity. The aorta is calcified. The remaining cardiomediastinal contours are unremarkable. Degenerative changes are present in the imaged spine and shoulders. No visible subdiaphragmatic free air. Upper abdominal bowel gas pattern is unremarkable. Mild circumferential body wall edema. Multilevel degenerative changes in the shoulders and spine and partially imaged SI joints. IMPRESSION: 1. Endotracheal tube tip terminates in the lower trachea, 2.3 cm from the carina. Consider retraction 2 cm to position in the  mid trachea. 2. Appropriate positioning of the transesophageal tube. 3. Accessed right IJ approach Port-A-Cath terminates in the mid SVC. 4. Increasing heterogeneous opacities throughout both lungs with some increasing confluence in the left perihilar space. Worsening fissural and septal thickening noted as well. Findings could reflect worsening airspace disease and/or edema. 5.  Aortic Atherosclerosis (ICD10-I70.0). These results will be called to the ordering clinician or representative by the Radiologist Assistant, and communication documented in the PACS or [employer]. Electronically Signed   By: Lovena Le M.D.   On: 03/20/2021 05:37   US Venous Img Lower  Bilateral (DVT)  Result Date: 03/20/2021 CLINICAL DATA:  Swelling EXAM: BILATERAL LOWER EXTREMITY VENOUS DOPPLER ULTRASOUND TECHNIQUE: Gray-scale sonography with compression, as well as color and duplex ultrasound, were performed to evaluate the deep venous system(s) from the level of the common femoral vein through the popliteal and proximal calf veins. COMPARISON:  None. FINDINGS: VENOUS Normal compressibility of the common femoral, superficial femoral, and popliteal veins, as well as the visualized calf veins. Visualized portions of profunda femoral vein and great saphenous vein unremarkable. No filling defects to suggest DVT on grayscale or color Doppler imaging. Doppler waveforms show normal direction of venous flow, normal respiratory phasicity and response to augmentation. OTHER None. Limitations: none IMPRESSION: No femoropopliteal DVT nor evidence of DVT within the visualized calf veins. If clinical symptoms are inconsistent or if there are persistent or worsening symptoms, further imaging (possibly involving the iliac veins) may be warranted. Electronically Signed   By: Lucrezia Europe M.D.   On: 03/20/2021 15:33   DG Chest Port 1 View  Result Date: 03/20/2021 CLINICAL DATA:  ET and NG placement EXAM: PORTABLE CHEST - 1 VIEW; ABDOMEN - 1 VIEW COMPARISON:  CT a chest 03/29/2021, CT chest, abdomen pelvis 02/19/2071, chest radiograph 03/20/2021 FINDINGS: Accessed right IJ approach Port-A-Cath tip terminates in the mid SVC. Endotracheal tube tip terminates in the lower trachea 2.3 cm in the carina. Consider retraction 2 cm to the mid trachea. Transesophageal tube tip terminates in the region of the distal gastric body with the side port distal to the GE junction. Telemetry leads and external support devices overlie the chest and abdomen Diffuse heterogeneous opacities again seen throughout both lungs, right greater than left with particular confluence in the right perihilar region. Some increasing left  perihilar opacity is noted as well with fissural and septal thickening throughout both lungs and indistinct pulmonary vascularity. The aorta is calcified. The remaining cardiomediastinal contours are unremarkable. Degenerative changes are present in the imaged spine and shoulders. No visible subdiaphragmatic free air. Upper abdominal bowel gas pattern is unremarkable. Mild circumferential body wall edema. Multilevel degenerative changes in the shoulders and spine and partially imaged SI joints. IMPRESSION: 1. Endotracheal tube tip terminates in the lower trachea, 2.3 cm from the carina. Consider retraction 2 cm to position in the mid trachea. 2. Appropriate positioning of the transesophageal tube. 3. Accessed right IJ approach Port-A-Cath terminates in the mid SVC. 4. Increasing heterogeneous opacities throughout both lungs with some increasing confluence in the left perihilar space. Worsening fissural and septal thickening noted as well. Findings could reflect worsening airspace disease and/or edema. 5.  Aortic Atherosclerosis (ICD10-I70.0). These results will be called to the ordering clinician or representative by the Radiologist Assistant, and communication documented in the PACS or [employer]. Electronically Signed   By: Lovena Le M.D.   On: 03/20/2021 05:37   DG Chest Port 1 View  Result Date: 03/20/2021 CLINICAL DATA:  Shortness of breath, COVID EXAM: PORTABLE CHEST 1 VIEW COMPARISON:  03/15/2021 FINDINGS: Airspace disease throughout the right lung and in the left lower lobe compatible with pneumonia. Heart is normal size. No effusions or pneumothorax. No acute bony abnormality. Right Port-A-Cath remains in stable position with the tip in the SVC. IMPRESSION: Diffuse right lung airspace disease and airspace disease in the left lower lobe. Findings compatible with pneumonia. Electronically Signed   By: Rolm Baptise M.D.   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    #Acute on chronic respiratory failure-multifactorial-COVID infection/superimposed bacterial pneumonia/underlying COPD emphysema.   Recommendations:  # Discussed with the patient daughter regarding guarded prognosis given patient's acute respiratory failure in the context of her underlying malignancy/emphysema. Continue current scope of care.  Appreciate pulmonary/critical care input.  Discussed with Dr.Aleskerov.  #Discussed CODE STATUS.  Discussed that I would strongly discourage chest compressions/defibrillation given the overall poor prognosis with underlying malignancy.  However at this time as per family wishes continue full code.  Recommend palliative care evaluation.  Discussed with [employer].  # 40 minutes face-to-face with the patient's daughter discussing the above plan of care; more than 50% of time spent on prognosis/ natural history; counseling and coordination.  Cammie Sickle, MD 03/20/2021  9:23 PM

## 2021-03-20 NOTE — Progress Notes (Addendum)
@   outset of shift, pt increasingly anxious/restless/reaching for tube. Pt intermittently following simple commands when calm. Fent titrated up and Propofol gtt added. Fent bolus via pump given multiple x's throughout shift. Levo stopped and Neo gtt started d/t increased HR. Conscious effort made to wean down Fent, Prop, and Neo.  At end of shift:   Fent @ 100 Prop @ 5 Neo @ 40  Potassium being replaced via OG tube AND IV. Mag replaced via IV.   Per Dr. Loni Muse., Mayes Sangiovanni not insert foley catheter for now. Bladder scan to assess any retention of urine. Purewick remains in place.   Bladder scanned near 1300, showed 400 ml urine. Per Dr. Loni Muse, okay to I/O cath. Post I/O cath, 600 ml out, some urine lost on pad (Pt began spontaneously urinating during procedure). Pt also had small soft/loose BM during I/O cath.    [place] of b/l lower ext done = negative for DVT   Substantial progress made w/ Fio2 over course of day, pt weaned from 100% to 35%.   Daughter @ bedside. Turn q2. Pt in no acute distress @ this time. Will continue to monitor.

## 2021-03-20 NOTE — Progress Notes (Signed)
Patient arrived to ICU unit around 240. Patients breathing improved. Patient not in active distress and goes to sleep after being moved into new room. to Patients 02 is 100% on 9L NCHF.  0340 Patient starts coughing O2 sats dropped into the 70s. NRB applied wit no relief. NP Hinton Dyer graves called to bedside for emergency intubated.   0415 Patient intuabated and given metoprolol for SVT.  [phone] patient resting comfortably on the vent sedated with fentanyl gtts  and VSS.  0545 Patients daughter is at bedside and chaplin is paged.

## 2021-03-20 NOTE — Progress Notes (Signed)
PT Cancellation Note  Patient Details Name: Donna Brennan MRN: [id number] DOB: [birth year]   Cancelled Treatment:    Reason Eval/Treat Not Completed: Other (comment). Pt with acute respiratory failure with hypoxia, transferred to CCU and emergently intubated. Due to change in status, will need new orders for PT services. Will discontinue current orders at this time. Please re-order when medically stable.   Anthon Harpole 03/20/2021, 9:06 AM Greggory Stallion, PT, DPT [phone]

## 2021-03-20 NOTE — Progress Notes (Signed)
NAME:  ESSENCE MERLE, MRN:  [id number], DOB:  July 02, 1946, LOS: 4 ADMISSION DATE:  04/03/2021, CONSULTATION DATE: 03/20/2021 REFERRING MD: Dr. 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 cholangio-/pancreaticobiliary).  She presented to [facility] ER from home on 06/11 with c/o weakness, productive cough, and shortness of breath.  The morning of 06/11 she tested positive for COVID via home test, she also tested positive for COVID on 06/6.  Pts husband COVID positive.  She has received 2 doses of COVID-19 vaccine, however she has not had a booster dose.     Upon arrival to the ER O2 sats were 85% on RA and pt noted to have increased work of breathing, therefore she was placed on 2L O2 via nasal canula.  CXR revealed chronic patchy density over the left mid to lower lung concerning for atelectasis vs. early superimposed infection.  CTA Chest negative for pulmonary embolism but concerning for COVID-19 pneumonia and chronic infection or aspiration.  She received iv solumedrol, empiric iv abx (rocephin and azithromycin), and remdesivir per protocol.  She was subsequently admitted to the [facility] unit per hospitalist team for additional workup and treatment.    Hospital course complicated by worsening acute hypoxic respiratory failure requiring pt transfer to ICU for emergent intubation on 06/15.    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 cholangio-/pancreaticobiliary) COPD Stage IV CKD Colon Polyps  Type II Diabetes Mellitus GERD Headache  Hepatitis  HLD IBS Tobacco Abuse Abuse  Vertigo  Significant Hospital Events: Including procedures, antibiotic start and stop dates in addition to other pertinent events   06/11: Pt admitted to the medsurg unit  06/11~06/15:  Azithromycin  06/11~06/13: Ceftriaxone  06/15: Pt transferred to ICU requiring emergent intubation per PCCM team.  I met with daughter [name] and Silva Bandy we had goals of care conference and reviewed current findings and answered questions.  At current moment they wish to continue with full scope of therapy and code status remains FULL CODE.     Interim History / Subjective:  Pt with severe respiratory distress requiring emergent mechanical intubation   Objective   Blood pressure 100/69, pulse 85, temperature 97.6 F (36.4 C), temperature source Oral, resp. rate (!) 25, height 5' 1.5" (1.562 m), weight 56.7 kg, SpO2 95 %.    Vent Mode: PRVC FiO2 (%):  [80 %-100 %] 80 % Set Rate:  [25 bmp] 25 bmp Vt Set:  [450 mL] 450 mL PEEP:  [10 cmH20] 10 cmH20 Plateau Pressure:  [20 cmH20] 20 cmH20   Intake/Output Summary (Last 24 hours) at 03/20/2021 0936 Last data filed at 03/19/2021 1745 Gross per 24 hour  Intake 1295.83 ml  Output --  Net 1295.83 ml    Filed Weights   03/26/2021 1211  Weight: 56.7 kg    Examination: General: acutely ill appearing female, NAD mechanically intubated  HENT: supple, JVD present  Lungs: rhonchi throughout, even, non labored, cyanotic, mechanically intubated  Cardiovascular: sinus tach, no R/G, 2+ radial/1+ distal pulses, no edema  Abdomen: +BS x4, soft, non distended Extremities: normal bulk and tone, cyanotic  Neuro: sedated, not following commands, PERRL GU: purewick in place   Labs/imaging that I havepersonally reviewed  (right click and "Reselect all SmartList Selections" daily)  06/11: CTA Chest revealed no evidence of pulmonary embolism. Development  of multifocal peripheral predominant ground-glass opacities, highly suspicious for COVID-19 pneumonia. Left lower lobe pulmonary opacities are slightly progressive and likely represent a combination of COVID-19 pneumonia and chronic infection or aspiration. Similar appearance of probable radiation  induced consolidation within the lingula and anterior left lower lobe. Anterior left lower lobe nodularity is similar. Subcarinal adenopathy, mild and new since 02/18/2021. Most likely reactive. Metastatic disease could look similar. Recommend attention on follow-up. Coronary artery atherosclerosis. Aortic Atherosclerosis (ICD10-I70.0). Right hepatic lobe metastasis, suboptimally evaluated secondary to bolus timing. 06/15: ABG~pH 7.42/pCO2 33/pO2 76/acid-base deficit 2.4; d-dimer 9.06 06/15: CXR~worsening airspace disease and/or edema   Resolved Hospital Problem list   N/A  Assessment & Plan:  Acute on chronic hypoxic respiratory failure secondary COVID-19 pneumonia with suspected bacterial infection vs. pulmonary edema further complicated by COPD Mechanical ventilation via ARDS protocol, target PRVC 6 cc/kg Wean PEEP and FiO2 as able to maintain O2 sats >88% Goal plateau pressure less than 30, driving pressure less than 15 Paralytics if necessary for vent synchrony, gas exchange Cycle prone positioning if necessary for oxygenation Deep sedation per PAD protocol, goal RASS -3, currently fentanyl and propofol  Diuresis as blood pressure and renal function can tolerate, goal CVP 5-8.  BNP pending  VAP prevention order set Remdesivir, plan for 5 days Steroids Follow inflammatory markers: Ferritin, D-dimer, CRP, IL-6, LDH Vitamin C, zinc Will check resp cultures  Will check pct if elevated will started cefepime  Sinus tachycardia  Continuous telemetry monitoring Will check troponin   Hypokalemia  Hypomagnesia Trend BMP  Replace electrolytes as indicated  Monitor UOP Avoid nephrotoxic medications   Pancytopenia likely secondary to stage IV poorly differentiated adenocarcinoma with unclear primary (suspected to be lung vs. hepatobiliary) Trend CBC Monitor for s/sx of bleeding and transfuse for hgb <7  Type II diabetes mellitus  CBG q4hrs SSI   GERD Scheduled IV protonix    Best practice (right click and "Reselect all SmartList Selections" daily)  Diet:  NPO Pain/Anxiety/Delirium protocol (if indicated): Yes (RASS goal -3) VAP protocol (if indicated): Yes DVT prophylaxis: LMWH GI prophylaxis: PPI Glucose control:  SSI Yes Central venous access: Right chest portacath  Arterial line:  N/A Foley:  N/A Mobility:  bed rest  PT consulted: N/A Last date of multidisciplinary goals of care discussion [N/A] Code Status:  full code Disposition: ICU  Labs   CBC: Recent Labs  Lab 03/26/2021 1225 03/17/21 0648 03/18/21 0521 03/19/21 0434 03/20/21 0457  WBC 12.8* 3.7* 3.0* 3.2* 8.1  NEUTROABS 10.3* 3.1 2.3 2.8 7.4  HGB 9.8* 7.6* 7.6* 7.4* 11.6*  HCT 30.6* 23.2* 23.0* 21.7* 33.8*  MCV 102.7* 103.1* 101.3* 98.6 95.2  PLT 222 127* 142* 132* 152     Basic Metabolic Panel: Recent Labs  Lab 04/01/2021 1225 03/17/21 0648 03/18/21 0521 03/19/21 0434 03/20/21 0457  NA 134* 137 137 141 142  K 4.5 3.6 3.2* 3.2* 2.9*  CL 101 111 110 112* 107  CO2 19* 20* 21* 23 21*  GLUCOSE 293* 224* 297* 265* 138*  BUN 29* 28* 29* 29* 25*  CREATININE 1.50* 1.07* 1.04* 1.03* 0.93  CALCIUM 8.4* 7.5* 7.6* 7.9* 7.7*  MG  --  1.4* 1.5* 1.7 1.7  PHOS  --  3.3 3.0 2.1* 4.8*    GFR: Estimated Creatinine Clearance: 39.8 mL/min (by C-G formula based on SCr of 0.93 mg/dL). Recent Labs  Lab 03/15/2021 1225 03/23/2021 1226 03/18/2021 1408 03/17/21 7672 03/18/21 0521 03/19/21 0434 03/20/21 0457 03/20/21 0947  PROCALCITON  --  0.56  --   --  0.49  --   --  0.69  WBC 12.8*  --   --  3.7* 3.0* 3.2* 8.1  --   LATICACIDVEN 4.8*  --  1.4  --   --   --   --   --      Liver Function Tests: Recent Labs  Lab 03/17/2021 1225 03/17/21 0648 03/18/21 0521 03/19/21 0434 03/20/21 0457  AST 39 26 26 42* 184*  ALT '16 14 14 22 ' 74*  ALKPHOS 113 72 67 142* 248*  BILITOT 0.9 0.4 0.4 0.4 0.3  PROT 8.3* 6.0* 5.9* 6.0* 6.7  ALBUMIN 2.3* 1.7* 1.6* 1.6* 1.8*    No results for input(s):  LIPASE, AMYLASE in the last 168 hours. No results for input(s): AMMONIA in the last 168 hours.  ABG    Component Value Date/Time   PHART 7.42 03/20/2021 0515   PCO2ART 33 03/20/2021 0515   PO2ART 76 (L) 03/20/2021 0515   HCO3 21.4 03/20/2021 0515   ACIDBASEDEF 2.4 (H) 03/20/2021 0515   O2SAT 95.3 03/20/2021 0515      Coagulation Profile: Recent Labs  Lab 03/08/2021 1225 03/17/21 0600  INR 1.2 1.4*     Cardiac Enzymes: No results for input(s): CKTOTAL, CKMB, CKMBINDEX, TROPONINI in the last 168 hours.  HbA1C: Hgb A1c MFr Bld  Date/Time Value Ref Range Status  03/19/2021 04:34 AM 7.5 (H) 4.8 - 5.6 % Final    Comment:    (NOTE)         Prediabetes: 5.7 - 6.4         Diabetes: >6.4         Glycemic control for adults with diabetes: <7.0     CBG: Recent Labs  Lab 03/20/21 0306 03/20/21 0323 03/20/21 0423 03/20/21 0752 03/20/21 0824  GLUCAP 55* 78 140* 60* 170*     Review of Systems:   Pt mechanically intubated   Past Medical History:  She,  has a past medical history of CKD (chronic kidney disease), stage IV ([place]), Colon polyps, COPD (chronic obstructive pulmonary disease) ([place]), Diabetes mellitus without complication ([place]), GERD (gastroesophageal reflux disease), Headache, Hepatitis, Hyperlipidemia, IBS (irritable bowel syndrome), Peptic ulcer, Tobacco abuse, Vertigo, and Wears dentures.   Surgical History:   Past Surgical History:  Procedure Laterality Date   ABDOMINAL HYSTERECTOMY     carbuncle removal     urethra   CATARACT EXTRACTION W/PHACO Left 10/29/2020   Procedure: CATARACT EXTRACTION PHACO AND INTRAOCULAR LENS PLACEMENT (IOC) LEFT DIABETIC 1051 01:00.0 ;  Surgeon: Eulogio Bear, MD;  Location: [place];  Service: Ophthalmology;  Laterality: Left;  Diabetic - insulin   CATARACT EXTRACTION W/PHACO Right 11/19/2020   Procedure: CATARACT EXTRACTION PHACO AND INTRAOCULAR LENS PLACEMENT (IOC) RIGHT DIABETIC;  Surgeon: Eulogio Bear,  MD;  Location: [place];  Service: Ophthalmology;  Laterality: Right;  16.54 1:40.0   COLONOSCOPY     COLONOSCOPY WITH PROPOFOL N/A 11/05/2017   Procedure: COLONOSCOPY WITH PROPOFOL;  Surgeon: Lollie Sails, MD;  Location: [facility] ENDOSCOPY;  Service: Endoscopy;  Laterality: N/A;   ESOPHAGOGASTRODUODENOSCOPY     ESOPHAGOGASTRODUODENOSCOPY (EGD) WITH PROPOFOL N/A 11/05/2017   Procedure: ESOPHAGOGASTRODUODENOSCOPY (EGD) WITH PROPOFOL;  Surgeon: Lollie Sails, MD;  Location: [facility] ENDOSCOPY;  Service: Endoscopy;  Laterality: N/A;   IR IMAGING GUIDED PORT INSERTION  12/13/2020   IR IMAGING GUIDED PORT INSERTION  01/25/2021   VIDEO BRONCHOSCOPY WITH ENDOBRONCHIAL NAVIGATION N/A 12/02/2019   Procedure: VIDEO BRONCHOSCOPY WITH  ENDOBRONCHIAL NAVIGATION;  Surgeon: Ottie Glazier, MD;  Location: [facility]S;  Service: Thoracic;  Laterality: N/A;   VIDEO BRONCHOSCOPY WITH ENDOBRONCHIAL ULTRASOUND N/A 12/02/2019   Procedure: VIDEO BRONCHOSCOPY WITH ENDOBRONCHIAL ULTRASOUND;  Surgeon: Ottie Glazier, MD;  Location: [facility] ORS;  Service: Thoracic;  Laterality: N/A;     Social History:   reports that she has been smoking cigarettes. She has a 11.25 pack-year smoking history. She has never used smokeless tobacco. She reports that she does not drink alcohol and does not use drugs.   Family History:  Her family history is negative for Breast cancer.   Allergies Allergies  Allergen Reactions   Penicillins Anaphylaxis and Rash    Did it involve swelling of the face/tongue/throat, SOB, or low BP? Yes Did it involve sudden or severe rash/hives, skin peeling, or any reaction on the inside of your mouth or nose? Yes Did you need to seek medical attention at a hospital or doctor's office? Yes When did it last happen?More than 40 years ago If all above answers are "NO", may proceed with cephalosporin use.    Metformin Other (See Comments)    Upset stomach   Sulfa Antibiotics Nausea And Vomiting      Home Medications  Prior to Admission medications   Medication Sig Start Date End Date Taking? Authorizing Provider  acetaminophen (TYLENOL) 500 MG tablet Take 1,000 mg by mouth every 6 (six) hours as needed for mild pain or moderate pain.   Yes [provider]  albuterol (VENTOLIN HFA) 108 (90 Base) MCG/ACT inhaler Inhale 1-2 puffs into the lungs every 6 (six) hours as needed for wheezing or shortness of breath. 02/10/20  Yes Cammie Sickle, MD  chlorpheniramine-HYDROcodone (TUSSIONEX) 10-8 MG/5ML SUER Take 5 mLs by mouth at bedtime as needed for cough. Patient taking differently: Take 5 mLs by mouth 3 (three) times daily as needed for cough. 02/08/21  Yes Cammie Sickle, MD  diazepam (VALIUM) 5 MG tablet Take 5 mg by mouth daily as needed (for vertigo).   Yes [provider]  ezetimibe (ZETIA) 10 MG tablet Take 10 mg by mouth daily.    Yes [provider]  Fluticasone-Salmeterol (ADVAIR) 250-50 MCG/DOSE AEPB Inhale 1 puff into the lungs 2 (two) times daily.   Yes [provider]  folic acid (FOLVITE) 1 MG tablet Take 1 tablet (1 mg total) by mouth daily. 12/14/20  Yes Cammie Sickle, MD  hyoscyamine (LEVBID) 0.375 MG 12 hr tablet Take 0.375 mg by mouth daily as needed (abdominal cramping).  03/24/19  Yes [provider]  Ipratropium-Albuterol (COMBIVENT) 20-100 MCG/ACT AERS respimat Inhale 1 puff into the lungs every 4 (four) hours as needed for wheezing.   Yes [provider]  ipratropium-albuterol (DUONEB) 0.5-2.5 (3) MG/3ML SOLN Take 3 mLs by nebulization every 6 (six) hours as needed (wheezing). 12/28/20  Yes Verlon Au, NP  LANTUS SOLOSTAR 100 UNIT/ML Solostar Pen Inject 12-20 Units into the skin See admin instructions. Inject 12u under the skin every morning and inject 20u under the skin at bedtime   Yes [provider]  lidocaine-prilocaine (EMLA) cream Apply 1 application topically as needed. 12/05/20  Yes  Cammie Sickle, MD  megestrol (MEGACE ES) 625 MG/5ML suspension Take 5 mLs (625 mg total) by mouth daily. 02/25/21  Yes Cammie Sickle, MD  Multiple Vitamin (MULTIVITAMIN WITH MINERALS) TABS tablet Take 1 tablet by mouth daily. One-A-Day for Women   Yes [provider]  nystatin (MYCOSTATIN) 100000  UNIT/ML suspension Take 5 mLs by mouth 2 (two) times daily as needed (throat irritation.).   Yes [provider]  omeprazole (PRILOSEC) 20 MG capsule Take 20 mg by mouth daily before breakfast.    Yes [provider]  ondansetron (ZOFRAN) 8 MG tablet Take 1 tablet (8 mg total) by mouth every 8 (eight) hours as needed for nausea or vomiting. 12/05/20  Yes Cammie Sickle, MD  prochlorperazine (COMPAZINE) 10 MG tablet Take 1 tablet (10 mg total) by mouth every 6 (six) hours as needed for nausea or vomiting. 12/05/20  Yes Cammie Sickle, MD  rosuvastatin (CRESTOR) 40 MG tablet Take 40 mg by mouth every evening.    Yes [provider]  vitamin B-12 (CYANOCOBALAMIN) 1000 MCG tablet Take 1,000 mcg by mouth daily.   Yes [provider]     Critical care provider statement:    Critical care time (minutes):  109   Critical care time was exclusive of:  Separately billable procedures and  treating other patients   Critical care was necessary to treat or prevent imminent or  life-threatening deterioration of the following conditions:  COVID19 pneumonia, lung cancer , bilateral pleural effusions, metastatic cancer, chronic severe anemia requiring blood transfusion.    Critical care was time spent personally by me on the following  activities:  Development of treatment plan with patient or surrogate,  discussions with consultants, evaluation of patient's response to  treatment, examination of patient, obtaining history from patient or  surrogate, ordering and performing treatments and interventions, ordering  and review of laboratory studies and  re-evaluation of patient's condition   I assumed direction of critical care for this patient from another  provider in my specialty: no          Ottie Glazier, M.D.  [facility] [place]

## 2021-03-20 NOTE — Consult Note (Signed)
[place]  Telephone:([phone] [phone]   Name: Donna Brennan Date: 03/20/2021 MRN: [id number]  DOB: 1945-12-10  Patient Care Team: Adin Hector, MD as PCP - General (Internal Medicine) Telford Nab, RN as Oncology Nurse Navigator Erby Pian, MD as Referring Physician (Specialist) Cammie Sickle, MD as Consulting Physician (Internal Medicine) Noreene Filbert, MD as Referring Physician (Radiation Oncology)    REASON FOR CONSULTATION: Donna Brennan is a 75 y.o. 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  Patient was intubated on 03/20/2021.  Palliative care was consulted to address goals.  SOCIAL HISTORY:     reports that she has been smoking cigarettes. She has a 11.25 pack-year smoking history. She has never used smokeless tobacco. She reports that she does not drink alcohol and does not use drugs.  Patient is married and lives at home with her husband.  She has 2 daughters.  Patient retired from [employer]:  On file  CODE STATUS: Full code  PAST MEDICAL HISTORY: Past Medical History:  Diagnosis Date   CKD (chronic kidney disease), stage IV (HCC)    Colon polyps    COPD (chronic obstructive pulmonary disease) (HCC)    Diabetes mellitus without complication (HCC)    GERD (gastroesophageal reflux disease)    Headache    Hepatitis    when in 4th or 5th grade.  Resolved   Hyperlipidemia    IBS (irritable bowel syndrome)    Peptic ulcer    Tobacco abuse    Vertigo    No "bad" episodes in over 1 year   Wears dentures    partial ujpper and lower    PAST SURGICAL HISTORY:  Past Surgical History:  Procedure Laterality Date   ABDOMINAL HYSTERECTOMY      carbuncle removal     urethra   CATARACT EXTRACTION W/PHACO Left 10/29/2020   Procedure: CATARACT EXTRACTION PHACO AND INTRAOCULAR LENS PLACEMENT (IOC) LEFT DIABETIC 1051 01:00.0 ;  Surgeon: Eulogio Bear, MD;  Location: [place];  Service: Ophthalmology;  Laterality: Left;  Diabetic - insulin   CATARACT EXTRACTION W/PHACO Right 11/19/2020   Procedure: CATARACT EXTRACTION PHACO AND INTRAOCULAR LENS PLACEMENT (IOC) RIGHT DIABETIC;  Surgeon: Eulogio Bear, MD;  Location: [place];  Service: Ophthalmology;  Laterality: Right;  16.54 1:40.0   COLONOSCOPY     COLONOSCOPY WITH PROPOFOL N/A 11/05/2017   Procedure: COLONOSCOPY WITH PROPOFOL;  Surgeon: Lollie Sails, MD;  Location: [facility] ENDOSCOPY;  Service: Endoscopy;  Laterality: N/A;   ESOPHAGOGASTRODUODENOSCOPY     ESOPHAGOGASTRODUODENOSCOPY (EGD) WITH PROPOFOL N/A 11/05/2017   Procedure: ESOPHAGOGASTRODUODENOSCOPY (EGD) WITH PROPOFOL;  Surgeon: Lollie Sails, MD;  Location: [facility] ENDOSCOPY;  Service: Endoscopy;  Laterality: N/A;   IR IMAGING GUIDED PORT INSERTION  12/13/2020   IR IMAGING GUIDED PORT INSERTION  01/25/2021   VIDEO BRONCHOSCOPY WITH ENDOBRONCHIAL NAVIGATION N/A 12/02/2019   Procedure: VIDEO BRONCHOSCOPY WITH ENDOBRONCHIAL NAVIGATION;  Surgeon: Ottie Glazier, MD;  Location: [facility]S;  Service: Thoracic;  Laterality: N/A;   VIDEO BRONCHOSCOPY WITH ENDOBRONCHIAL ULTRASOUND N/A 12/02/2019   Procedure: VIDEO BRONCHOSCOPY WITH ENDOBRONCHIAL ULTRASOUND;  Surgeon: Ottie Glazier, MD;  Location: [facility];  Service: Thoracic;  Laterality: N/A;    HEMATOLOGY/ONCOLOGY HISTORY:  Oncology History Overview Note  #FEB 2021- LUNG,  LEFT LOWER LOBE; ENB BRONCHOALVEOLAR LAVAGE:  - SUSPICIOUS FOR MALIGNANCY. 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 LUNG ORIGIN vs cholangio- CA 19-9/CEA > 5000.April 7th, 2022-cancer type ID-90% "probability cervix adenocarcinoma-cannot exclude lung 5%; other tumor types less than 5% including gastroesophageal adenocarcinoma  # Cycle #1- CarboAlimta [waiting on NGS]; #2- carbo-Keytruda [stopped Alimta]; GCSF; s/p 3 cycles of carbo-keytruda- May 16th CT-Partial response  # NGS/MOLECULAR TESTS: MARCH 2022- PDL-1 TPS=0%; TMB-HIGH; No target**    # PALLIATIVE CARE EVALUATION:  # PAIN MANAGEMENT:    DIAGNOSIS: Lung adeno vs cholangio  STAGE:  IV       ;  GOALS: pallaitive  CURRENT/MOST RECENT THERAPY : carbo-keytruda    Primary cancer of left lower lobe of lung ([place])  12/21/2019 Initial Diagnosis   Primary cancer of left lower lobe of lung ([place])   12/04/2020 Cancer Staging   Staging form: Lung, AJCC 8th Edition - Clinical: Stage IVA (cTX, cN2, pM1b) - Signed by Cammie Sickle, MD on 12/04/2020  Histopathologic type: Adenocarcinoma, NOS    12/14/2020 -  Chemotherapy    Patient is on Treatment Plan: LUNG NSCLC KEYTRUDA / CARBOPLATIN Q21D X 1 CYCLES         ALLERGIES:  is allergic to penicillins, metformin, and sulfa antibiotics.  MEDICATIONS:  Current Facility-Administered Medications  Medication Dose Route Frequency Provider Last Rate Last Admin   0.9 %  sodium chloride infusion (Manually program via Guardrails IV Fluids)   Intravenous Once Jennye Boroughs, MD       acetaminophen (TYLENOL) tablet 650 mg  650 mg Oral Q6H PRN Agbata, Tochukwu, MD   650 mg at 03/18/21 2136   Or   acetaminophen (TYLENOL) suppository 650 mg  650 mg Rectal Q6H PRN Agbata, Tochukwu, MD       albuterol (VENTOLIN HFA) 108 (90 Base) MCG/ACT inhaler 1-2 puff  1-2 puff Inhalation Q6H PRN Agbata, Tochukwu, MD   2 puff at 03/20/21 0024   alteplase (CATHFLO ACTIVASE) injection 2 mg  2 mg Intracatheter Once Wyvonnia Dusky, MD       amiodarone (NEXTERONE PREMIX) 360-4.14 MG/200ML-% (1.8 mg/mL) IV  infusion        Held at 03/20/21 0425   amiodarone (NEXTERONE) 150-4.21 MG/100ML-% bolus        Held at 03/20/21 0998   ascorbic acid (VITAMIN C) tablet 500 mg  500 mg Per Tube Daily Milus Banister, NP   500 mg at 03/20/21 0946   Chlorhexidine Gluconate Cloth 2 % PADS 6 each  6 each Topical Daily Jennye Boroughs, MD   6 each at 03/19/21 1025   dextrose (GLUTOSE) 40 % oral gel 62 g  2 Tube Oral STAT Jennye Boroughs, MD       docusate (COLACE) 50 MG/5ML liquid 100 mg  100 mg Per Tube BID Milus Banister, NP   100 mg at 03/20/21 0946   enoxaparin (LOVENOX) injection 40 mg  40 mg Subcutaneous Q24H Benita Gutter, RPH   40 mg at 03/19/21 2255   ezetimibe (ZETIA) tablet 10 mg  10 mg Per Tube Daily Milus Banister, NP   10 mg at 03/20/21 0945   feeding supplement (VITAL AF 1.2 CAL) liquid 1,000 mL  1,000 mL Per Tube Continuous Ottie Glazier, MD 30 mL/hr at 03/20/21 1212 1,000 mL at 03/20/21 1212   fentaNYL (SUBLIMAZE)  100 MCG/2ML injection            fentaNYL (SUBLIMAZE) bolus via infusion 25-100 mcg  25-100 mcg Intravenous Q15 min PRN Milus Banister, NP   100 mcg at 03/20/21 1227   fentaNYL (SUBLIMAZE) injection 25 mcg  25 mcg Intravenous Once Milus Banister, NP       fentaNYL 10 mcg/ml infusion            fentaNYL 2559mg in NS 2531m(1053mml) infusion-PREMIX  25-200 mcg/hr Intravenous Continuous GraMilus BanisterP 10 mL/hr at 03/20/21 0908 100 mcg/hr at 06/45/36/4608032folic acid (FOLVITE) tablet 1 mg  1 mg Per Tube Daily GraMilus BanisterP   1 mg at 03/20/21 0946   free water 60 mL  60 mL Per Tube Q4H AleOttie GlazierD   60 mL at 03/20/21 1213   glucose 4 GM chewable tablet            HYDROmorphone HCl (DILAUDID) liquid 2 mg  2 mg Per Tube TID AleOttie GlazierD   2 mg at 03/20/21 0945   hyoscyamine (LEVBID) 0.375 MG 12 hr tablet 0.375 mg  0.375 mg Oral Daily PRN Agbata, Tochukwu, MD       insulin aspart (novoLOG) injection 0-15 Units  0-15 Units Subcutaneous Q4H Graves, DanRaeford RazorP   2 Units at  03/20/21 0530   lidocaine-prilocaine (EMLA) cream 1 application  1 application Topical Once Agbata, Tochukwu, MD       magnesium sulfate IVPB 2 g 50 mL  2 g Intravenous Once ChaBenita GutterPH       methylPREDNISolone sodium succinate (SOLU-MEDROL) 40 mg/mL injection 40 mg  40 mg Intravenous Q12H Graves, Dana E, NP   40 mg at 03/20/21 0600   multivitamin with minerals tablet 1 tablet  1 tablet Per Tube Daily GraMilus BanisterP   1 tablet at 03/20/21 0946   norepinephrine (LEVOPHED) 4-5 MG/250ML-% infusion SOLN        Stopped at 03/20/21 0800   ondansetron (ZOFRAN) tablet 4 mg  4 mg Oral Q6H PRN Agbata, Tochukwu, MD       Or   ondansetron (ZOFRAN) injection 4 mg  4 mg Intravenous Q6H PRN Agbata, Tochukwu, MD       pantoprazole (PROTONIX) injection 40 mg  40 mg Intravenous Q24H Graves, Dana E, NP   40 mg at 03/20/21 0559   phenylephrine CONCENTRATED 100m61m sodium chloride 0.9% 250mL37m4mg/m23minfusion  0-400 mcg/min Intravenous Titrated GravesMilus Banister.25 mL/hr at 03/20/21 1404 35 mcg/min at 03/20/21 1404   polyethylene glycol (MIRALAX / GLYCOLAX) packet 17 g  17 g Per Tube Daily GravesMilus Banister 17 g at 03/20/21 0946   potassium chloride 10 mEq in 100 mL IVPB  10 mEq Intravenous Q1 Hr x 4 Graves, Dana E, NP 100 mL/hr at 03/20/21 1354 10 mEq at 03/20/21 1354   propofol (DIPRIVAN) 1000 MG/100ML infusion  0-50 mcg/kg/min Intravenous Continuous GravesMilus Banister.4 mL/hr at 03/20/21 0852 10 mcg/kg/min at 03/20/21 0852   rocuronium (ZEMURON) 50 MG/5ML injection            rosuvastatin (CRESTOR) tablet 40 mg  40 mg Per Tube QPM Graves, Dana ERaeford Razor     vitamin B-12 (CYANOCOBALAMIN) tablet 1,000 mcg  1,000 mcg Per Tube Daily GravesMilus Banister 1,000 mcg at 03/20/21 0946   zinc sulfate capsule 220  mg  220 mg Per Tube Daily Milus Banister, NP   220 mg at 03/20/21 0946   Facility-Administered Medications Ordered in Other Encounters  Medication Dose Route Frequency Provider Last Rate Last  Admin   sodium chloride flush (NS) 0.9 % injection 10 mL  10 mL Intravenous PRN Cammie Sickle, MD   10 mL at 12/24/20 0842   sodium chloride flush (NS) 0.9 % injection 10 mL  10 mL Intravenous PRN Verlon Au, NP   10 mL at 12/28/20 1415    VITAL SIGNS: BP 132/76   Pulse 82   Temp 97.6 F (36.4 C) (Oral)   Resp (!) 25   Ht 5' 1.5" (1.562 m)   Wt 125 lb (56.7 kg)   SpO2 95%   BMI 23.24 kg/m  Filed Weights   03/09/2021 1211  Weight: 125 lb (56.7 kg)    Estimated body mass index is 23.24 kg/m as calculated from the following:   Height as of this encounter: 5' 1.5" (1.562 m).   Weight as of this encounter: 125 lb (56.7 kg).  LABS: CBC:    Component Value Date/Time   WBC 8.1 03/20/2021 0457   HGB 11.6 (L) 03/20/2021 0457   HCT 33.8 (L) 03/20/2021 0457   PLT 152 03/20/2021 0457   MCV 95.2 03/20/2021 0457   NEUTROABS 7.4 03/20/2021 0457   LYMPHSABS 0.3 (L) 03/20/2021 0457   MONOABS 0.3 03/20/2021 0457   EOSABS 0.0 03/20/2021 0457   BASOSABS 0.0 03/20/2021 0457   Comprehensive Metabolic Panel:    Component Value Date/Time   NA 142 03/20/2021 0457   K 4.6 03/20/2021 1413   CL 107 03/20/2021 0457   CO2 21 (L) 03/20/2021 0457   BUN 25 (H) 03/20/2021 0457   CREATININE 0.93 03/20/2021 0457   GLUCOSE 138 (H) 03/20/2021 0457   CALCIUM 7.7 (L) 03/20/2021 0457   AST 184 (H) 03/20/2021 0457   ALT 74 (H) 03/20/2021 0457   ALKPHOS 248 (H) 03/20/2021 0457   BILITOT 0.3 03/20/2021 0457   PROT 6.7 03/20/2021 0457   ALBUMIN 1.8 (L) 03/20/2021 0457    RADIOGRAPHIC STUDIES: DG Abd 1 View  Result Date: 03/20/2021 CLINICAL DATA:  ET and NG placement EXAM: PORTABLE CHEST - 1 VIEW; ABDOMEN - 1 VIEW COMPARISON:  CT a chest 03/14/2021, CT chest, abdomen pelvis 02/19/2071, chest radiograph 03/20/2021 FINDINGS: Accessed right IJ approach Port-A-Cath tip terminates in the mid SVC. Endotracheal tube tip terminates in the lower trachea 2.3 cm in the carina. Consider retraction 2 cm  to the mid trachea. Transesophageal tube tip terminates in the region of the distal gastric body with the side port distal to the GE junction. Telemetry leads and external support devices overlie the chest and abdomen Diffuse heterogeneous opacities again seen throughout both lungs, right greater than left with particular confluence in the right perihilar region. Some increasing left perihilar opacity is noted as well with fissural and septal thickening throughout both lungs and indistinct pulmonary vascularity. The aorta is calcified. The remaining cardiomediastinal contours are unremarkable. Degenerative changes are present in the imaged spine and shoulders. No visible subdiaphragmatic free air. Upper abdominal bowel gas pattern is unremarkable. Mild circumferential body wall edema. Multilevel degenerative changes in the shoulders and spine and partially imaged SI joints. IMPRESSION: 1. Endotracheal tube tip terminates in the lower trachea, 2.3 cm from the carina. Consider retraction 2 cm to position in the mid trachea. 2. Appropriate positioning of the transesophageal tube. 3. Accessed right IJ  approach Port-A-Cath terminates in the mid SVC. 4. Increasing heterogeneous opacities throughout both lungs with some increasing confluence in the left perihilar space. Worsening fissural and septal thickening noted as well. Findings could reflect worsening airspace disease and/or edema. 5.  Aortic Atherosclerosis (ICD10-I70.0). These results will be called to the ordering clinician or representative by the Radiologist Assistant, and communication documented in the PACS or [employer]. Electronically Signed   By: Lovena Le M.D.   On: 03/20/2021 05:37   CT Angio Chest PE W and/or Wo Contrast  Result Date: 03/15/2021 CLINICAL DATA:  Weakness. Cough and shortness of breath. COVID positive. History of COPD. EXAM: CT ANGIOGRAPHY CHEST WITH CONTRAST TECHNIQUE: Multidetector CT imaging of the chest was performed using  the standard protocol during bolus administration of intravenous contrast. Multiplanar CT image reconstructions and MIPs were obtained to evaluate the vascular anatomy. CONTRAST:  48m OMNIPAQUE IOHEXOL 350 MG/ML SOLN COMPARISON:  Plain film of earlier today.  Chest CT of 02/19/2021. FINDINGS: Cardiovascular: The quality of this exam for evaluation of pulmonary embolism is good. No evidence of pulmonary embolism. Right Port-A-Cath tip low SVC. Aortic atherosclerosis. Normal heart size, without pericardial effusion. Three vessel coronary artery calcification. Mediastinum/Nodes: No supraclavicular adenopathy. Index precarinal 7 mm node on 36/4 is similar to 8 mm on the prior. A node within the subcarinal station with extension into the azygoesophageal recess measures 1.4 cm on 44/4 versus 7 mm on the prior exam (when remeasured). Left perihilar soft tissue thickening which is likely radiation induced. No well-defined adenopathy. Lungs/Pleura: Trace left pleural fluid. Left endobronchial compression is felt to be similar to on the prior. Worsening left lower lobe aeration with progressive airspace and ground-glass opacity. Development of bilateral, right greater than left peripheral predominant ground-glass. Similar dependent lingular consolidation. Lateral left lower lobe 7 mm lung nodule is unchanged on 52/6. The more central anterior left lower lobe nodule is likely obscured by airspace disease. Upper Abdomen: Right hepatic lobe treated metastasis is not well evaluated secondary to bolus timing. High left hepatic lobe cysts. Normal imaged portions of the spleen, stomach, pancreas, gallbladder, kidneys, adrenal glands. Abdominal aortic and branch vessel atherosclerosis. Musculoskeletal: No acute osseous abnormality. Review of the MIP images confirms the above findings. IMPRESSION: 1.  No evidence of pulmonary embolism. 2. Development of multifocal peripheral predominant ground-glass opacities, highly suspicious for  COVID-19 pneumonia. 3. Left lower lobe pulmonary opacities are slightly progressive and likely represent a combination of COVID-19 pneumonia and chronic infection or aspiration. 4. Similar appearance of probable radiation induced consolidation within the lingula and anterior left lower lobe. Anterior left lower lobe nodularity is similar. 5. Subcarinal adenopathy, mild and new since 02/18/2021. Most likely reactive. Metastatic disease could look similar. Recommend attention on follow-up. 6. Coronary artery atherosclerosis. Aortic Atherosclerosis (ICD10-I70.0). 7. Right hepatic lobe metastasis, suboptimally evaluated secondary to bolus timing. Electronically Signed   By: KAbigail MiyamotoM.D.   On: 03/08/2021 14:56   DG SWALLOW FUNC OP MEDICARE SPEECH PATH  Result Date: 03/01/2021 Objective Swallowing Evaluation: Type of Study: MBS-Modified Barium Swallow Study  Patient Details Name: PMARGRETTA ZAMORANOMRN: [id number] of Birth: [birth year]Today's Date: 03/01/2021 Time: SLP Start Time (ACUTE ONLY): 1330 -SLP Stop Time (ACUTE ONLY): 1400 SLP Time Calculation (min) (ACUTE ONLY): 30 min Past Medical History: Past Medical History: Diagnosis Date  CKD (chronic kidney disease), stage IV (HCC)   Colon polyps   [place]D [place] disease) [place]   Diabetes mellitus without complication ([place]  GERD (gastroesophageal reflux disease)   Headache   Hepatitis   when in 4th or 5th grade.  Resolved  Hyperlipidemia   IBS (irritable bowel syndrome)   Peptic ulcer   Tobacco abuse   Vertigo   No "bad" episodes in over 1 year  Wears dentures   partial ujpper and lower Past Surgical History: Past Surgical History: Procedure Laterality Date  ABDOMINAL HYSTERECTOMY    carbuncle removal    urethra  CATARACT EXTRACTION W/PHACO Left 10/29/2020  Procedure: CATARACT EXTRACTION PHACO AND INTRAOCULAR LENS PLACEMENT (IOC) LEFT DIABETIC 1051 01:00.0 ;  Surgeon: Eulogio Bear, MD;  Location: [place];  Service:  Ophthalmology;  Laterality: Left;  Diabetic - insulin  CATARACT EXTRACTION W/PHACO Right 11/19/2020  Procedure: CATARACT EXTRACTION PHACO AND INTRAOCULAR LENS PLACEMENT (IOC) RIGHT DIABETIC;  Surgeon: Eulogio Bear, MD;  Location: [place];  Service: Ophthalmology;  Laterality: Right;  16.54 1:40.0  COLONOSCOPY    COLONOSCOPY WITH PROPOFOL N/A 11/05/2017  Procedure: COLONOSCOPY WITH PROPOFOL;  Surgeon: Lollie Sails, MD;  [facility]: [facility] ENDOSCOPY;  Service: Endoscopy;  Laterality: N/A;  ESOPHAGOGASTRODUODENOSCOPY    ESOPHAGOGASTRODUODENOSCOPY (EGD) WITH PROPOFOL N/A 11/05/2017  Procedure: ESOPHAGOGASTRODUODENOSCOPY (EGD) WITH PROPOFOL;  Surgeon: Lollie Sails, MD;  Location: [facility] ENDOSCOPY;  Service: Endoscopy;  Laterality: N/A;  IR IMAGING GUIDED PORT INSERTION  12/13/2020  IR IMAGING GUIDED PORT INSERTION  01/25/2021  VIDEO BRONCHOSCOPY WITH ENDOBRONCHIAL NAVIGATION N/A 12/02/2019  Procedure: VIDEO BRONCHOSCOPY WITH ENDOBRONCHIAL NAVIGATION;  Surgeon: Ottie Glazier, MD;  Location: [facility];  Service: Thoracic;  Laterality: N/A;  VIDEO BRONCHOSCOPY WITH ENDOBRONCHIAL ULTRASOUND N/A 12/02/2019  Procedure: VIDEO BRONCHOSCOPY WITH ENDOBRONCHIAL ULTRASOUND;  Surgeon: Ottie Glazier, MD;  Location: [facility];  Service: Thoracic;  Laterality: N/A; HPI: Pt is a 75 year old female who was referred by Dr Rogue Bussing d/t difficulty swallowing. Pt is currently undergoing treatment adenocarcinoma-s/p liver biopsy (?  Recurrent lung cancer versus cholangiocarcinoma) thought clinically lung cancer status post carbo-Keytruda.  Subjective: pt pleasant, good historian Assessment / Plan / Recommendation CHL IP CLINICAL IMPRESSIONS 03/01/2021 Clinical Impression Pt demonstrates adequate oropharyngeal abilities when consuming nectar thick liquids, thin liquids, puree, graham crackers and whole barium tablet. Penetration and aspiration were not observed during the study. Behaviorally, pt has sensation to effortfully  swallow "to get it to go down." Pt does have indentation of the posterior pharyngeal wall from anterior cervical spine osteophytes and prominent cricopharyngeus but bolus was not impeded. Pt does endorse strong family history of esophageal strictures. MD may wish to consider an eosphageal assessment if pt is interested. SLP Visit Diagnosis Dysphagia, unspecified (R13.10) Attention and concentration deficit following -- Frontal lobe and executive function deficit following -- Impact on safety and function No limitations   CHL IP TREATMENT RECOMMENDATION 03/01/2021 Treatment Recommendations No treatment recommended at this time   No flowsheet data found. CHL IP DIET RECOMMENDATION 03/01/2021 SLP Diet Recommendations Regular solids;Thin liquid Liquid Administration via Cup;Straw Medication Administration Whole meds with liquid Compensations Minimize environmental distractions;Slow rate;Small sips/bites Postural Changes Remain semi-upright after after feeds/meals (Comment);Seated upright at 90 degrees   CHL IP OTHER RECOMMENDATIONS 03/01/2021 Recommended Consults Consider esophageal assessment Oral Care Recommendations Oral care BID Other Recommendations --   CHL IP FOLLOW UP RECOMMENDATIONS 03/01/2021 Follow up Recommendations None   No flowsheet data found.     CHL IP ORAL PHASE 03/01/2021 Oral Phase WFL Oral - Pudding Teaspoon -- Oral - Pudding Cup -- Oral - Honey Teaspoon -- Oral - Honey Cup -- Oral -  Nectar Teaspoon -- Oral - Nectar Cup -- Oral - Nectar Straw -- Oral - Thin Teaspoon -- Oral - Thin Cup -- Oral - Thin Straw -- Oral - Puree -- Oral - Mech Soft -- Oral - Regular -- Oral - Multi-Consistency -- Oral - Pill -- Oral Phase - Comment --  CHL IP PHARYNGEAL PHASE 03/01/2021 Pharyngeal Phase WFL Pharyngeal- Pudding Teaspoon -- Pharyngeal -- Pharyngeal- Pudding Cup -- Pharyngeal -- Pharyngeal- Honey Teaspoon -- Pharyngeal -- Pharyngeal- Honey Cup -- Pharyngeal -- Pharyngeal- Nectar Teaspoon -- Pharyngeal --  Pharyngeal- Nectar Cup -- Pharyngeal -- Pharyngeal- Nectar Straw -- Pharyngeal -- Pharyngeal- Thin Teaspoon -- Pharyngeal -- Pharyngeal- Thin Cup -- Pharyngeal -- Pharyngeal- Thin Straw -- Pharyngeal -- Pharyngeal- Puree -- Pharyngeal -- Pharyngeal- Mechanical Soft -- Pharyngeal -- Pharyngeal- Regular -- Pharyngeal -- Pharyngeal- Multi-consistency -- Pharyngeal -- Pharyngeal- Pill -- Pharyngeal -- Pharyngeal Comment --  CHL IP CERVICAL ESOPHAGEAL PHASE 03/01/2021 Cervical Esophageal Phase (No Data) Pudding Teaspoon -- Pudding Cup -- Honey Teaspoon -- Honey Cup -- Nectar Teaspoon -- Nectar Cup -- Nectar Straw -- Thin Teaspoon -- Thin Cup -- Thin Straw -- Puree -- Mechanical Soft -- Regular -- Multi-consistency -- Pill -- Cervical Esophageal Comment -- Happi B. Rutherford Nail M.S., CCC-SLP, [place] Office [phone] Happi Rutherford Nail 03/01/2021, 2:42 PM            CLINICAL DATA:  Possible aspiration EXAM: MODIFIED BARIUM SWALLOW TECHNIQUE: Different consistencies of barium were administered orally to the patient by the Speech Pathologist. Imaging of the pharynx was performed in the lateral projection. The radiologist was present in the fluoroscopy room for this study, providing personal supervision. FLUOROSCOPY TIME:  Fluoroscopy Time:  1 minutes 36 seconds Radiation Exposure Index (if provided by the fluoroscopic device): 3.5 mGy COMPARISON:  None. FINDINGS: No aspiration with any attempted consistency. There is indentation of the posterior pharyngeal wall from anterior cervical spine osteophytes and prominent cricopharyngeus without holdup of contrast. IMPRESSION: No aspiration. Please refer to the Speech Pathologists report for complete details and recommendations. Electronically Signed   By: Macy Mis M.D.   On: 03/01/2021 13:53   CT CHEST ABDOMEN PELVIS W CONTRAST  Result Date: 02/19/2021 CLINICAL DATA:  Metastatic left lower lobe non-small cell lung cancer, status  post chemotherapy and radiation EXAM: CT CHEST, ABDOMEN, AND PELVIS WITH CONTRAST TECHNIQUE: Multidetector CT imaging of the chest, abdomen and pelvis was performed following the standard protocol during bolus administration of intravenous contrast. CONTRAST:  63m OMNIPAQUE IOHEXOL 300 MG/ML SOLN, additional oral enteric contrast COMPARISON:  PET-CT, 11/21/2020, CT chest, 11/06/2020 FINDINGS: CT CHEST FINDINGS Cardiovascular: Right chest port catheter. Normal heart size. Left and right coronary artery calcifications no pericardial effusion. Mediastinum/Nodes: Interval decrease in size of a high left paratracheal/superior mediastinal lymph node, previously FDG PET avid, measuring 0.7 x 0.6 cm, previously 1.0 x 0.8 cm (series 2, image 7). Interval decrease in size of enlarged, previously FDG PET avid pretracheal lymph node, measuring 0.8 x 0.8 cm, previously 1.1 x 1.1 cm (series 2, image 22). Thyroid gland, trachea, and esophagus demonstrate no significant findings. Lungs/Pleura: Unchanged, bandlike post treatment fibrosis of the perihilar left lung and lingula (series 3, image 61, 70). Unchanged nodules in the anterior left lower lobe measuring 1.3 x 0.6 cm and 0.7 x 0.5 cm (series 3, image 74, 70). Extensive bronchial plugging in the dependent left lung, with new heterogeneous and ground-glass airspace opacity and centrilobular in the left lower lobe (series 3, image 89, 98).  Unchanged 3 mm fissural nodule of the anterior right lower lobe (series 3, image 77). Background of very fine centrilobular pulmonary nodularity. Musculoskeletal: No chest wall mass or suspicious bone lesions identified. CT ABDOMEN PELVIS FINDINGS Hepatobiliary: Slight interval decrease in size of a mass of the peripheral right lobe of the liver, hepatic segment VI, measuring 3.5 x 3.5 cm, previously 4.2 x 4.0 cm (series 2, image 53). Hepatic steatosis. No gallstones, gallbladder wall thickening, or biliary dilatation. Pancreas: Unremarkable.  No pancreatic ductal dilatation or surrounding inflammatory changes. Spleen: Normal in size without significant abnormality. Adrenals/Urinary Tract: Adrenal glands are unremarkable. Kidneys are normal, without renal calculi, solid lesion, or hydronephrosis. Bladder is unremarkable. Stomach/Bowel: Stomach is within normal limits. Appendix appears normal. No evidence of bowel wall thickening, distention, or inflammatory changes. Vascular/Lymphatic: Aortic atherosclerosis. No enlarged abdominal or pelvic lymph nodes. Reproductive: Status post hysterectomy. Other: Right inguinal hernia containing predominantly fat and a small portion of the bladder dome (series 2, image 113). No abdominopelvic ascites. Musculoskeletal: No acute or significant osseous findings. IMPRESSION: 1. Interval decrease in size of previously FDG avid mediastinal lymph nodes, consistent with treatment response of nodal metastatic disease. 2. Slight interval decrease in size of a mass of the peripheral right lobe of the liver, consistent with treatment response of hepatic metastatic disease. 3. Unchanged, bandlike post treatment fibrosis of the perihilar left lung and lingula as well as nodules of the adjacent anterior left lower lobe. 4. Extensive bronchial plugging in the dependent left lung, with new heterogeneous and ground-glass airspace opacity and centrilobular in the left lower lobe. Findings are most consistent with aspiration. 5. Background of very fine centrilobular pulmonary nodularity, consistent with smoking-related respiratory bronchiolitis. 6. Coronary artery disease. Aortic Atherosclerosis (ICD10-I70.0). Electronically Signed   By: Eddie Candle M.D.   On: 02/19/2021 16:22   DG Chest Port 1 View  Result Date: 03/20/2021 CLINICAL DATA:  ET and NG placement EXAM: PORTABLE CHEST - 1 VIEW; ABDOMEN - 1 VIEW COMPARISON:  CT a chest 04/04/2021, CT chest, abdomen pelvis 02/19/2071, chest radiograph 03/20/2021 FINDINGS: Accessed right IJ  approach Port-A-Cath tip terminates in the mid SVC. Endotracheal tube tip terminates in the lower trachea 2.3 cm in the carina. Consider retraction 2 cm to the mid trachea. Transesophageal tube tip terminates in the region of the distal gastric body with the side port distal to the GE junction. Telemetry leads and external support devices overlie the chest and abdomen Diffuse heterogeneous opacities again seen throughout both lungs, right greater than left with particular confluence in the right perihilar region. Some increasing left perihilar opacity is noted as well with fissural and septal thickening throughout both lungs and indistinct pulmonary vascularity. The aorta is calcified. The remaining cardiomediastinal contours are unremarkable. Degenerative changes are present in the imaged spine and shoulders. No visible subdiaphragmatic free air. Upper abdominal bowel gas pattern is unremarkable. Mild circumferential body wall edema. Multilevel degenerative changes in the shoulders and spine and partially imaged SI joints. IMPRESSION: 1. Endotracheal tube tip terminates in the lower trachea, 2.3 cm from the carina. Consider retraction 2 cm to position in the mid trachea. 2. Appropriate positioning of the transesophageal tube. 3. Accessed right IJ approach Port-A-Cath terminates in the mid SVC. 4. Increasing heterogeneous opacities throughout both lungs with some increasing confluence in the left perihilar space. Worsening fissural and septal thickening noted as well. Findings could reflect worsening airspace disease and/or edema. 5.  Aortic Atherosclerosis (ICD10-I70.0). These results will be called to the ordering clinician  or representative by the Radiologist Assistant, and communication documented in the PACS or [employer]. Electronically Signed   By: Lovena Le M.D.   On: 03/20/2021 05:37   DG Chest Port 1 View  Result Date: 03/20/2021 CLINICAL DATA:  Shortness of breath, COVID EXAM: PORTABLE CHEST  1 VIEW COMPARISON:  04/04/2021 FINDINGS: Airspace disease throughout the right lung and in the left lower lobe compatible with pneumonia. Heart is normal size. No effusions or pneumothorax. No acute bony abnormality. Right Port-A-Cath remains in stable position with the tip in the SVC. IMPRESSION: Diffuse right lung airspace disease and airspace disease in the left lower lobe. Findings compatible with pneumonia. Electronically Signed   By: Rolm Baptise M.D.   On: 03/20/2021 01:02   DG Chest Portable 1 View  Result Date: 04/01/2021 CLINICAL DATA:  Shortness of breath with productive cough and weakness. COVID-19 positive this morning. EXAM: PORTABLE CHEST 1 VIEW COMPARISON:  12/28/2020 and chest CT 02/18/2021 FINDINGS: Right IJ Port-A-Cath unchanged. Lungs are adequately inflated demonstrate patchy density over the left mid to lower lung which may be slightly worse in the left base. Extensive bilateral costochondral calcification. Cardiomediastinal silhouette and remainder of the exam is unchanged. IMPRESSION: Chronic patchy density over the left mid to lower lung. This may be slightly worse over the left base which may be due to atelectasis or early superimposed infection. Electronically Signed   By: Marin Olp M.D.   On: 03/20/2021 13:08    PERFORMANCE STATUS (ECOG) : 4 - Bedbound  Review of Systems Unable to provide  Physical Exam General: Critically ill-appearing Cardiovascular: Tachycardic Pulmonary: Synchronous on vent Extremities: no edema, no joint deformities Skin: no rashes Neurological: Sedated  IMPRESSION: Patient is critically ill in ICU on pressors and ventilator.  I met with patient's daughter, [name].  Together, we discussed patient's current medical problems.  She understands that patient is critically ill but states that family remains in agreement with current scope of treatment.  They want to remain optimistic and hopeful for improvement.  She also understands that even  with improvement, it is possible the patient may not return to her previous baseline.  At baseline, daughter says that patient was functionally independent with all of her own care and was doing quite well on chemotherapy.  We discussed CODE STATUS.  I encouraged consideration for DNR.  Family to discuss decision-making.  PLAN: -Continue current scope of treatment -Family considering CODE STATUS -Will follow   Patient expressed understanding and was in agreement with this plan. She also understands that She can call the clinic at any time with any questions, concerns, or complaints.     Time Total: 60 minutes  Visit consisted of counseling and education dealing with the complex and emotionally intense issues of symptom management and palliative care in the setting of serious and potentially life-threatening illness.Greater than 50%  of this time was spent counseling and coordinating care related to the above assessment and plan.  Signed by: Altha Harm, PhD, NP-C

## 2021-03-21 ENCOUNTER — Inpatient Hospital Stay: Payer: Medicare Other

## 2021-03-21 LAB — CBC WITH DIFFERENTIAL/PLATELET
Abs Immature Granulocytes: 0.04 10*3/uL (ref 0.00–0.07)
Basophils Absolute: 0.1 10*3/uL (ref 0.0–0.1)
Basophils Relative: 1 %
Eosinophils Absolute: 0 10*3/uL (ref 0.0–0.5)
Eosinophils Relative: 0 %
HCT: 27.1 % — ABNORMAL LOW (ref 36.0–46.0)
Hemoglobin: 9.4 g/dL — ABNORMAL LOW (ref 12.0–15.0)
Immature Granulocytes: 1 %
Lymphocytes Relative: 5 %
Lymphs Abs: 0.3 10*3/uL — ABNORMAL LOW (ref 0.7–4.0)
MCH: 33.2 pg (ref 26.0–34.0)
MCHC: 34.7 g/dL (ref 30.0–36.0)
MCV: 95.8 fL (ref 80.0–100.0)
Monocytes Absolute: 0.3 10*3/uL (ref 0.1–1.0)
Monocytes Relative: 5 %
Neutro Abs: 5.2 10*3/uL (ref 1.7–7.7)
Neutrophils Relative %: 88 %
Platelets: 108 10*3/uL — ABNORMAL LOW (ref 150–400)
RBC: 2.83 MIL/uL — ABNORMAL LOW (ref 3.87–5.11)
RDW: 19.2 % — ABNORMAL HIGH (ref 11.5–15.5)
Smear Review: NORMAL
WBC: 5.9 10*3/uL (ref 4.0–10.5)
nRBC: 0 % (ref 0.0–0.2)

## 2021-03-21 LAB — CULTURE, BLOOD (ROUTINE X 2)
Culture: NO GROWTH
Culture: NO GROWTH
Special Requests: ADEQUATE
Special Requests: ADEQUATE

## 2021-03-21 LAB — GLUCOSE, CAPILLARY
Glucose-Capillary: 235 mg/dL — ABNORMAL HIGH (ref 70–99)
Glucose-Capillary: 223 mg/dL — ABNORMAL HIGH (ref 70–99)
Glucose-Capillary: 223 mg/dL — ABNORMAL HIGH (ref 70–99)
Glucose-Capillary: 125 mg/dL — ABNORMAL HIGH (ref 70–99)
Glucose-Capillary: 133 mg/dL — ABNORMAL HIGH (ref 70–99)

## 2021-03-21 LAB — COMPREHENSIVE METABOLIC PANEL
ALT: 67 U/L — ABNORMAL HIGH (ref 0–44)
AST: 92 U/L — ABNORMAL HIGH (ref 15–41)
Albumin: 1.5 g/dL — ABNORMAL LOW (ref 3.5–5.0)
Alkaline Phosphatase: 196 U/L — ABNORMAL HIGH (ref 38–126)
Anion gap: 5 (ref 5–15)
BUN: 47 mg/dL — ABNORMAL HIGH (ref 8–23)
CO2: 22 mmol/L (ref 22–32)
Calcium: 7.4 mg/dL — ABNORMAL LOW (ref 8.9–10.3)
Chloride: 111 mmol/L (ref 98–111)
Creatinine, Ser: 1.2 mg/dL — ABNORMAL HIGH (ref 0.44–1.00)
GFR, Estimated: 47 mL/min — ABNORMAL LOW (ref >60)
Glucose, Bld: 223 mg/dL — ABNORMAL HIGH (ref 70–99)
Potassium: 4.6 mmol/L (ref 3.5–5.1)
Sodium: 138 mmol/L (ref 135–145)
Total Bilirubin: 0.4 mg/dL (ref 0.3–1.2)
Total Protein: 5.6 g/dL — ABNORMAL LOW (ref 6.5–8.1)

## 2021-03-21 LAB — TRIGLYCERIDES: Triglycerides: 127 mg/dL (ref <150)

## 2021-03-21 LAB — PHOSPHORUS: Phosphorus: 3.6 mg/dL (ref 2.5–4.6)

## 2021-03-21 LAB — C-REACTIVE PROTEIN: CRP: 18.7 mg/dL — ABNORMAL HIGH (ref <1.0)

## 2021-03-21 LAB — FERRITIN: Ferritin: 1482 ng/mL — ABNORMAL HIGH (ref 11–307)

## 2021-03-21 LAB — MAGNESIUM: Magnesium: 2 mg/dL (ref 1.7–2.4)

## 2021-03-21 LAB — D-DIMER, QUANTITATIVE: D-Dimer, Quant: 8.41 ug/mL-FEU — ABNORMAL HIGH (ref 0.00–0.50)

## 2021-03-21 LAB — PROCALCITONIN: Procalcitonin: 0.51 ng/mL

## 2021-03-21 MED ORDER — DEXMEDETOMIDINE HCL IN NACL 400 MCG/100ML IV SOLN
0.4000 ug/kg/h | INTRAVENOUS | Status: DC
  Administered 2021-03-21: 1.2 ug/kg/h via INTRAVENOUS
  Administered 2021-03-21: 0.4 ug/kg/h via INTRAVENOUS
  Administered 2021-03-22 – 2021-03-23 (×5): 1 ug/kg/h via INTRAVENOUS
  Administered 2021-03-23: 0.7 ug/kg/h via INTRAVENOUS
  Administered 2021-03-23: 1 ug/kg/h via INTRAVENOUS
  Administered 2021-03-24 (×2): 0.5 ug/kg/h via INTRAVENOUS
  Administered 2021-03-25: 0.3 ug/kg/h via INTRAVENOUS
  Filled 2021-03-21 (×12): qty 100

## 2021-03-21 MED ORDER — METOPROLOL TARTRATE 5 MG/5ML IV SOLN
5.0000 mg | INTRAVENOUS | Status: DC | PRN
  Administered 2021-03-21: 5 mg via INTRAVENOUS
  Filled 2021-03-21: qty 5

## 2021-03-21 MED ORDER — ALBUMIN HUMAN 25 % IV SOLN
12.5000 g | Freq: Once | INTRAVENOUS | Status: AC
  Administered 2021-03-21: 12.5 g via INTRAVENOUS
  Filled 2021-03-21: qty 50

## 2021-03-21 MED ORDER — METHYLPREDNISOLONE SODIUM SUCC 40 MG IJ SOLR
40.0000 mg | Freq: Every day | INTRAMUSCULAR | Status: DC
  Administered 2021-03-22 – 2021-03-24 (×3): 40 mg via INTRAVENOUS
  Filled 2021-03-21 (×3): qty 1

## 2021-03-21 MED ORDER — METOPROLOL TARTRATE 5 MG/5ML IV SOLN
INTRAVENOUS | Status: AC
  Filled 2021-03-21: qty 5

## 2021-03-21 NOTE — Consult Note (Signed)
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 Pharmacy consulted to assist with electrolyte monitoring and replacement as indicated.  Patient is currently intubated, sedated, and on MV in the ICU. Requiring vasopressors for BP support.   Goal of Therapy:  Electrolytes within normal limits  Plan:  --No electrolyte replacement warranted at this time --Follow-up electrolytes in AM  Donna Brennan 03/21/2021 8:37 AM

## 2021-03-21 NOTE — Progress Notes (Signed)
Near 1100, SAT/SBT initiated, pt placed on pressure support 30% 5/5. Fent @ 45, Prop weaned off, Precedex gtt started. FIO2 increased to 40% @ 1245.    Near 1415, pt became increasingly anxious/agitated & had BM. Pt's HR went into 200s & SBP 200s. Dr. Lanney Gins called to bedside w/ RT. Pt placed back on PRVC 60%, Fent @ 200, Dex @ 1.2. Fent bolus 100 mcg given. PRN Metoprolol ordered and given, 5 mg. Pt returned back to sinus rhythm.   Neo remains on.   Pt voided and had 1 medium loose BM.   Turn q2. Pt in no acute distress @ this time. Will continue to monitor.

## 2021-03-21 NOTE — Progress Notes (Signed)
Donna Brennan   DOB:September 25, 1946   WU#:[id number]    Subjective: No further acute events overnight.  Patient remains intubated/sedated; on pressors.   Objective:  Vitals:   03/21/21 1815 03/21/21 1830  BP:  139/76  Pulse: 71 71  Resp: (!) 25 (!) 25  Temp:    SpO2: 97% 98%     Intake/Output Summary (Last 24 hours) at 03/21/2021 1946 Last data filed at 03/21/2021 0700 Gross per 24 hour  Intake 1442.31 ml  Output 315 ml  Net 1127.31 ml    Physical Exam Constitutional:      Comments: Patient intubated sedated.  HENT:     Head: Normocephalic and atraumatic.  Eyes:     Pupils: Pupils are equal, round, and reactive to light.  Cardiovascular:     Rate and Rhythm: Normal rate and regular rhythm.  Pulmonary:     Effort: No respiratory distress.     Breath sounds: No wheezing.     Comments: Decreased breath sounds bilaterally at bases.  No wheeze or crackles Abdominal:     General: Bowel sounds are normal. There is no distension.     Palpations: Abdomen is soft. There is no mass.     Tenderness: There is no abdominal tenderness. There is no guarding or rebound.  Musculoskeletal:        General: No tenderness. Normal range of motion.  Skin:    General: Skin is warm.  Neurological:     Mental Status: She is alert.     Comments: Sedated.  Psychiatric:        Mood and Affect: Affect normal.     Labs:  Lab Results  Component Value Date   WBC 5.9 03/21/2021   HGB 9.4 (L) 03/21/2021   HCT 27.1 (L) 03/21/2021   MCV 95.8 03/21/2021   PLT 108 (L) 03/21/2021   NEUTROABS 5.2 03/21/2021    Lab Results  Component Value Date   NA 138 03/21/2021   K 4.6 03/21/2021   CL 111 03/21/2021   CO2 22 03/21/2021    Studies:  DG Abd 1 View  Result Date: 03/20/2021 CLINICAL DATA:  ET and NG placement EXAM: PORTABLE CHEST - 1 VIEW; ABDOMEN - 1 VIEW COMPARISON:  CT a chest 03/13/2021, CT chest, abdomen pelvis 02/19/2071, chest radiograph 03/20/2021 FINDINGS: Accessed right IJ approach  Port-A-Cath tip terminates in the mid SVC. Endotracheal tube tip terminates in the lower trachea 2.3 cm in the carina. Consider retraction 2 cm to the mid trachea. Transesophageal tube tip terminates in the region of the distal gastric body with the side port distal to the GE junction. Telemetry leads and external support devices overlie the chest and abdomen Diffuse heterogeneous opacities again seen throughout both lungs, right greater than left with particular confluence in the right perihilar region. Some increasing left perihilar opacity is noted as well with fissural and septal thickening throughout both lungs and indistinct pulmonary vascularity. The aorta is calcified. The remaining cardiomediastinal contours are unremarkable. Degenerative changes are present in the imaged spine and shoulders. No visible subdiaphragmatic free air. Upper abdominal bowel gas pattern is unremarkable. Mild circumferential body wall edema. Multilevel degenerative changes in the shoulders and spine and partially imaged SI joints. IMPRESSION: 1. Endotracheal tube tip terminates in the lower trachea, 2.3 cm from the carina. Consider retraction 2 cm to position in the mid trachea. 2. Appropriate positioning of the transesophageal tube. 3. Accessed right IJ approach Port-A-Cath terminates in the mid SVC. 4. Increasing heterogeneous opacities  throughout both lungs with some increasing confluence in the left perihilar space. Worsening fissural and septal thickening noted as well. Findings could reflect worsening airspace disease and/or edema. 5.  Aortic Atherosclerosis (ICD10-I70.0). These results will be called to the ordering clinician or representative by the Radiologist Assistant, and communication documented in the PACS or [employer]. Electronically Signed   By: Lovena Le M.D.   On: 03/20/2021 05:37   US Venous Img Lower Bilateral (DVT)  Result Date: 03/20/2021 CLINICAL DATA:  Swelling EXAM: BILATERAL LOWER EXTREMITY  VENOUS DOPPLER ULTRASOUND TECHNIQUE: Gray-scale sonography with compression, as well as color and duplex ultrasound, were performed to evaluate the deep venous system(s) from the level of the common femoral vein through the popliteal and proximal calf veins. COMPARISON:  None. FINDINGS: VENOUS Normal compressibility of the common femoral, superficial femoral, and popliteal veins, as well as the visualized calf veins. Visualized portions of profunda femoral vein and great saphenous vein unremarkable. No filling defects to suggest DVT on grayscale or color Doppler imaging. Doppler waveforms show normal direction of venous flow, normal respiratory phasicity and response to augmentation. OTHER None. Limitations: none IMPRESSION: No femoropopliteal DVT nor evidence of DVT within the visualized calf veins. If clinical symptoms are inconsistent or if there are persistent or worsening symptoms, further imaging (possibly involving the iliac veins) may be warranted. Electronically Signed   By: Lucrezia Europe M.D.   On: 03/20/2021 15:33   DG Chest Port 1 View  Result Date: 03/20/2021 CLINICAL DATA:  ET and NG placement EXAM: PORTABLE CHEST - 1 VIEW; ABDOMEN - 1 VIEW COMPARISON:  CT a chest 03/08/2021, CT chest, abdomen pelvis 02/19/2071, chest radiograph 03/20/2021 FINDINGS: Accessed right IJ approach Port-A-Cath tip terminates in the mid SVC. Endotracheal tube tip terminates in the lower trachea 2.3 cm in the carina. Consider retraction 2 cm to the mid trachea. Transesophageal tube tip terminates in the region of the distal gastric body with the side port distal to the GE junction. Telemetry leads and external support devices overlie the chest and abdomen Diffuse heterogeneous opacities again seen throughout both lungs, right greater than left with particular confluence in the right perihilar region. Some increasing left perihilar opacity is noted as well with fissural and septal thickening throughout both lungs and  indistinct pulmonary vascularity. The aorta is calcified. The remaining cardiomediastinal contours are unremarkable. Degenerative changes are present in the imaged spine and shoulders. No visible subdiaphragmatic free air. Upper abdominal bowel gas pattern is unremarkable. Mild circumferential body wall edema. Multilevel degenerative changes in the shoulders and spine and partially imaged SI joints. IMPRESSION: 1. Endotracheal tube tip terminates in the lower trachea, 2.3 cm from the carina. Consider retraction 2 cm to position in the mid trachea. 2. Appropriate positioning of the transesophageal tube. 3. Accessed right IJ approach Port-A-Cath terminates in the mid SVC. 4. Increasing heterogeneous opacities throughout both lungs with some increasing confluence in the left perihilar space. Worsening fissural and septal thickening noted as well. Findings could reflect worsening airspace disease and/or edema. 5.  Aortic Atherosclerosis (ICD10-I70.0). These results will be called to the ordering clinician or representative by the Radiologist Assistant, and communication documented in the PACS or [employer]. Electronically Signed   By: Lovena Le M.D.   On: 03/20/2021 05:37   DG Chest Port 1 View  Result Date: 03/20/2021 CLINICAL DATA:  Shortness of breath, COVID EXAM: PORTABLE CHEST 1 VIEW COMPARISON:  04/04/2021 FINDINGS: Airspace disease throughout the right lung and in the left lower  lobe compatible with pneumonia. Heart is normal size. No effusions or pneumothorax. No acute bony abnormality. Right Port-A-Cath remains in stable position with the tip in the SVC. IMPRESSION: Diffuse right lung airspace disease and airspace disease in the left lower lobe. Findings compatible with pneumonia. Electronically Signed   By: Rolm Baptise M.D.   On: 03/20/2021 01:02    Primary cancer of left lower lobe of lung [facility]) #75 year old female patient with metastatic poorly differentiated adenocarcinoma [lung versus  pancreaticobiliary]-is currently admitted to hospital for [place] pneumonia/noted to have acute respiratory failure.  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    #Acute on chronic respiratory failure-multifactorial-COVID infection/superimposed bacterial pneumonia/underlying COPD emphysema-s/p intubation ; on pressors -currently on 40% FiO2.  Discussed with Dr.Aleskerov-trial of extubation soon.  #Full code-s/p evaluation with palliative care.    Cammie Sickle, MD 03/21/2021  7:46 PM

## 2021-03-21 NOTE — Progress Notes (Signed)
RT was called to patient room to assess patient and patient found in distress with HR in the 140-170's as well as diaphoretic and saturations in the 70's. Returned patient to previous PRVC settings and stopped Weaning trial. MD notified. RN at bedside with patient. Once patient began to calm down and relax, her HR and saturations began to improve. FIO2 left at 60% at this time, will titrate as able.

## 2021-03-21 NOTE — Progress Notes (Signed)
NAME:  BETTYJO LUNDBLAD, MRN:  [id number], DOB:  [birth year], LOS: 5 ADMISSION DATE:  03/24/2021, CONSULTATION DATE: 03/20/2021 REFERRING MD: Dr. 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 cholangio-/pancreaticobiliary).  She presented to [facility] ER from home on 06/11 with c/o weakness, productive cough, and shortness of breath.  The morning of 06/11 she tested positive for COVID via home test, she also tested positive for COVID on 06/6.  Pts husband COVID positive.  She has received 2 doses of COVID-19 vaccine, however she has not had a booster dose.     Upon arrival to the ER O2 sats were 85% on RA and pt noted to have increased work of breathing, therefore she was placed on 2L O2 via nasal canula.  CXR revealed chronic patchy density over the left mid to lower lung concerning for atelectasis vs. early superimposed infection.  CTA Chest negative for pulmonary embolism but concerning for COVID-19 pneumonia and chronic infection or aspiration.  She received iv solumedrol, empiric iv abx (rocephin and azithromycin), and remdesivir per protocol.  She was subsequently admitted to the [facility] unit per hospitalist team for additional workup and treatment.    Hospital course complicated by worsening acute hypoxic respiratory failure requiring pt transfer to ICU for emergent intubation on 06/15.    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 cholangio-/pancreaticobiliary) COPD Stage IV CKD Colon Polyps  Type II Diabetes Mellitus GERD Headache  Hepatitis  HLD IBS Tobacco Abuse Abuse  Vertigo  Significant Hospital Events: Including procedures, antibiotic start and stop dates in addition to other pertinent events   06/11: Pt admitted to the medsurg unit  06/11~06/15:  Azithromycin  06/11~06/13: Ceftriaxone  06/15: Pt transferred to ICU requiring emergent intubation per PCCM team.  I met with daughter [name] and Silva Bandy we had goals of care conference and reviewed current findings and answered questions.  At current moment they wish to continue with full scope of therapy and code status remains FULL CODE.   03/21/21- patient failed SBT due to severe cardiopulmonary distress  Interim History / Subjective:  Pt with severe respiratory distress requiring emergent mechanical intubation   Objective   Blood pressure 129/75, pulse 95, temperature (!) 97.2 F (36.2 C), temperature source Axillary, resp. rate (!) 22, height 5' 1.5" (1.562 m), weight 56.7 kg, SpO2 95 %.    Vent Mode: PRVC FiO2 (%):  [30 %-60 %] 30 % Set Rate:  [25 bmp] 25 bmp Vt Set:  [450 mL] 450 mL PEEP:  [10 cmH20] 10 cmH20   Intake/Output Summary (Last 24 hours) at 03/21/2021 1035 Last data filed at 03/21/2021 0700 Gross per 24 hour  Intake 1442.31 ml  Output 915 ml  Net 527.31 ml    Filed Weights   03/08/2021 1211  Weight: 56.7 kg    Examination: General: acutely ill appearing female, NAD mechanically intubated  HENT: supple, JVD present  Lungs: rhonchi throughout, even, non labored, cyanotic, mechanically intubated  Cardiovascular: sinus tach, no R/G, 2+ radial/1+ distal pulses, no edema  Abdomen: +BS x4, soft, non distended Extremities: normal bulk and tone, cyanotic  Neuro: sedated, not following commands, PERRL GU: purewick in place   Labs/imaging that I havepersonally reviewed  (right click and "Reselect all SmartList Selections" daily)  06/11: CTA Chest revealed no evidence of  pulmonary embolism. Development of multifocal peripheral predominant ground-glass opacities, highly suspicious for COVID-19 pneumonia. Left lower lobe pulmonary opacities are slightly progressive and likely represent a combination of COVID-19 pneumonia and chronic infection or aspiration. Similar  appearance of probable radiation induced consolidation within the lingula and anterior left lower lobe. Anterior left lower lobe nodularity is similar. Subcarinal adenopathy, mild and new since 02/18/2021. Most likely reactive. Metastatic disease could look similar. Recommend attention on follow-up. Coronary artery atherosclerosis. Aortic Atherosclerosis (ICD10-I70.0). Right hepatic lobe metastasis, suboptimally evaluated secondary to bolus timing. 06/15: ABG~pH 7.42/pCO2 33/pO2 76/acid-base deficit 2.4; d-dimer 9.06 06/15: CXR~worsening airspace disease and/or edema   Resolved Hospital Problem list   N/A  Assessment & Plan:  Acute on chronic hypoxic respiratory failure secondary COVID-19 pneumonia with suspected bacterial infection vs. pulmonary edema further complicated by COPD Mechanical ventilation via ARDS protocol, target PRVC 6 cc/kg Wean PEEP and FiO2 as able to maintain O2 sats >88% Goal plateau pressure less than 30, driving pressure less than 15 Paralytics if necessary for vent synchrony, gas exchange Cycle prone positioning if necessary for oxygenation Deep sedation per PAD protocol, goal RASS -3, currently fentanyl and propofol  Diuresis as blood pressure and renal function can tolerate, goal CVP 5-8.  BNP pending  VAP prevention order set Remdesivir, plan for 5 days Steroids Follow inflammatory markers: Ferritin, D-dimer, CRP, IL-6, LDH Vitamin C, zinc Will check resp cultures  Will check pct if elevated will started cefepime  Sinus tachycardia  Continuous telemetry monitoring Will check troponin   Hypokalemia  Hypomagnesia Trend BMP  Replace electrolytes as indicated  Monitor UOP Avoid nephrotoxic medications   Pancytopenia likely secondary to stage IV poorly differentiated adenocarcinoma with unclear primary (suspected to be lung vs. hepatobiliary) Trend CBC Monitor for s/sx of bleeding and transfuse for hgb <7  Type II diabetes mellitus  CBG q4hrs SSI    GERD Scheduled IV protonix   Best practice (right click and "Reselect all SmartList Selections" daily)  Diet:  NPO Pain/Anxiety/Delirium protocol (if indicated): Yes (RASS goal -3) VAP protocol (if indicated): Yes DVT prophylaxis: LMWH GI prophylaxis: PPI Glucose control:  SSI Yes Central venous access: Right chest portacath  Arterial line:  N/A Foley:  N/A Mobility:  bed rest  PT consulted: N/A Last date of multidisciplinary goals of care discussion [N/A] Code Status:  full code Disposition: ICU  Labs   CBC: Recent Labs  Lab 03/17/21 0648 03/18/21 0521 03/19/21 0434 03/20/21 0457 03/21/21 0614  WBC 3.7* 3.0* 3.2* 8.1 5.9  NEUTROABS 3.1 2.3 2.8 7.4 5.2  HGB 7.6* 7.6* 7.4* 11.6* 9.4*  HCT 23.2* 23.0* 21.7* 33.8* 27.1*  MCV 103.1* 101.3* 98.6 95.2 95.8  PLT 127* 142* 132* 152 108*     Basic Metabolic Panel: Recent Labs  Lab 03/17/21 0648 03/18/21 0521 03/19/21 0434 03/20/21 0457 03/20/21 1413 03/21/21 0614  NA 137 137 141 142  --  138  K 3.6 3.2* 3.2* 2.9* 4.6 4.6  CL 111 110 112* 107  --  111  CO2 20* 21* 23 21*  --  22  GLUCOSE 224* 297* 265* 138*  --  223*  BUN 28* 29* 29* 25*  --  47*  CREATININE 1.07* 1.04* 1.03* 0.93  --  1.20*  CALCIUM 7.5* 7.6* 7.9* 7.7*  --  7.4*  MG 1.4* 1.5* 1.7 1.7 1.6* 2.0  PHOS 3.3 3.0 2.1* 4.8* 3.5 3.6    GFR: Estimated Creatinine Clearance: 30.9 mL/min (A) (by C-G formula based on SCr of  1.2 mg/dL (H)). Recent Labs  Lab 03/12/2021 1225 03/30/2021 1226 03/11/2021 1408 03/17/21 7116 03/18/21 0521 03/19/21 0434 03/20/21 0457 03/20/21 0652 03/21/21 0614  PROCALCITON  --  0.56  --   --  0.49  --   --  0.69  --   WBC 12.8*  --   --    < > 3.0* 3.2* 8.1  --  5.9  LATICACIDVEN 4.8*  --  1.4  --   --   --   --   --   --    < > = values in this interval not displayed.     Liver Function Tests: Recent Labs  Lab 03/17/21 0648 03/18/21 0521 03/19/21 0434 03/20/21 0457 03/21/21 0614  AST 26 26 42* 184* 92*  ALT '14  14 22 ' 74* 67*  ALKPHOS 72 67 142* 248* 196*  BILITOT 0.4 0.4 0.4 0.3 0.4  PROT 6.0* 5.9* 6.0* 6.7 5.6*  ALBUMIN 1.7* 1.6* 1.6* 1.8* 1.5*    No results for input(s): LIPASE, AMYLASE in the last 168 hours. No results for input(s): AMMONIA in the last 168 hours.  ABG    Component Value Date/Time   PHART 7.42 03/20/2021 0515   PCO2ART 33 03/20/2021 0515   PO2ART 76 (L) 03/20/2021 0515   HCO3 21.4 03/20/2021 0515   ACIDBASEDEF 2.4 (H) 03/20/2021 0515   O2SAT 95.3 03/20/2021 0515      Coagulation Profile: Recent Labs  Lab 03/09/2021 1225 03/17/21 0600  INR 1.2 1.4*     Cardiac Enzymes: No results for input(s): CKTOTAL, CKMB, CKMBINDEX, TROPONINI in the last 168 hours.  HbA1C: Hgb A1c MFr Bld  Date/Time Value Ref Range Status  03/19/2021 04:34 AM 7.5 (H) 4.8 - 5.6 % Final    Comment:    (NOTE)         Prediabetes: 5.7 - 6.4         Diabetes: >6.4         Glycemic control for adults with diabetes: <7.0     CBG: Recent Labs  Lab 03/20/21 2008 03/20/21 2125 03/20/21 2335 03/21/21 0405 03/21/21 0738  GLUCAP 82 111* 127* 235* 223*     Review of Systems:   Pt mechanically intubated   Past Medical History:  She,  has a past medical history of CKD (chronic kidney disease), stage IV ([place]), Colon polyps, COPD (chronic obstructive pulmonary disease) ([place]), Diabetes mellitus without complication ([place]), GERD (gastroesophageal reflux disease), Headache, Hepatitis, Hyperlipidemia, IBS (irritable bowel syndrome), Peptic ulcer, Tobacco abuse, Vertigo, and Wears dentures.   Surgical History:   Past Surgical History:  Procedure Laterality Date   ABDOMINAL HYSTERECTOMY     carbuncle removal     urethra   CATARACT EXTRACTION W/PHACO Left 10/29/2020   Procedure: CATARACT EXTRACTION PHACO AND INTRAOCULAR LENS PLACEMENT (IOC) LEFT DIABETIC 1051 01:00.0 ;  Surgeon: Eulogio Bear, MD;  Location: [place];  Service: Ophthalmology;  Laterality: Left;  Diabetic -  insulin   CATARACT EXTRACTION W/PHACO Right 11/19/2020   Procedure: CATARACT EXTRACTION PHACO AND INTRAOCULAR LENS PLACEMENT (IOC) RIGHT DIABETIC;  Surgeon: Eulogio Bear, MD;  Location: [place];  Service: Ophthalmology;  Laterality: Right;  16.54 1:40.0   COLONOSCOPY     COLONOSCOPY WITH PROPOFOL N/A 11/05/2017   Procedure: COLONOSCOPY WITH PROPOFOL;  Surgeon: Lollie Sails, MD;  Location: [facility] ENDOSCOPY;  Service: Endoscopy;  Laterality: N/A;   ESOPHAGOGASTRODUODENOSCOPY     ESOPHAGOGASTRODUODENOSCOPY (EGD) WITH PROPOFOL N/A 11/05/2017   Procedure: ESOPHAGOGASTRODUODENOSCOPY (EGD)  WITH PROPOFOL;  Surgeon: Lollie Sails, MD;  Location[facility] ENDOSCOPY;  Service: Endoscopy;  Laterality: N/A;   IR IMAGING GUIDED PORT INSERTION  12/13/2020   IR IMAGING GUIDED PORT INSERTION  01/25/2021   VIDEO BRONCHOSCOPY WITH ENDOBRONCHIAL NAVIGATION N/A 12/02/2019   Procedure: VIDEO BRONCHOSCOPY WITH ENDOBRONCHIAL NAVIGATION;  Surgeon: Ottie Glazier, MD;  Location: [facility] ORS;  Service: Thoracic;  Laterality: N/A;   VIDEO BRONCHOSCOPY WITH ENDOBRONCHIAL ULTRASOUND N/A 12/02/2019   Procedure: VIDEO BRONCHOSCOPY WITH ENDOBRONCHIAL ULTRASOUND;  Surgeon: Ottie Glazier, MD;  Location: [facility] ORS;  Service: Thoracic;  Laterality: N/A;     Social History:   reports that she has been smoking cigarettes. She has a 11.25 pack-year smoking history. She has never used smokeless tobacco. She reports that she does not drink alcohol and does not use drugs.   Family History:  Her family history is negative for Breast cancer.   Allergies Allergies  Allergen Reactions   Penicillins Anaphylaxis and Rash    Did it involve swelling of the face/tongue/throat, SOB, or low BP? Yes Did it involve sudden or severe rash/hives, skin peeling, or any reaction on the inside of your mouth or nose? Yes Did you need to seek medical attention at a hospital or doctor's office? Yes When did it last happen?More than 40  years ago If all above answers are "NO", may proceed with cephalosporin use.    Metformin Other (See Comments)    Upset stomach   Sulfa Antibiotics Nausea And Vomiting     Home Medications  Prior to Admission medications   Medication Sig Start Date End Date Taking? Authorizing Provider  acetaminophen (TYLENOL) 500 MG tablet Take 1,000 mg by mouth every 6 (six) hours as needed for mild pain or moderate pain.   Yes [provider]  albuterol (VENTOLIN HFA) 108 (90 Base) MCG/ACT inhaler Inhale 1-2 puffs into the lungs every 6 (six) hours as needed for wheezing or shortness of breath. 02/10/20  Yes Cammie Sickle, MD  chlorpheniramine-HYDROcodone (TUSSIONEX) 10-8 MG/5ML SUER Take 5 mLs by mouth at bedtime as needed for cough. Patient taking differently: Take 5 mLs by mouth 3 (three) times daily as needed for cough. 02/08/21  Yes Cammie Sickle, MD  diazepam (VALIUM) 5 MG tablet Take 5 mg by mouth daily as needed (for vertigo).   Yes [provider]  ezetimibe (ZETIA) 10 MG tablet Take 10 mg by mouth daily.    Yes [provider]  Fluticasone-Salmeterol (ADVAIR) 250-50 MCG/DOSE AEPB Inhale 1 puff into the lungs 2 (two) times daily.   Yes [provider]  folic acid (FOLVITE) 1 MG tablet Take 1 tablet (1 mg total) by mouth daily. 12/14/20  Yes Cammie Sickle, MD  hyoscyamine (LEVBID) 0.375 MG 12 hr tablet Take 0.375 mg by mouth daily as needed (abdominal cramping).  03/24/19  Yes [provider]  Ipratropium-Albuterol (COMBIVENT) 20-100 MCG/ACT AERS respimat Inhale 1 puff into the lungs every 4 (four) hours as needed for wheezing.   Yes [provider]  ipratropium-albuterol (DUONEB) 0.5-2.5 (3) MG/3ML SOLN Take 3 mLs by nebulization every 6 (six) hours as needed (wheezing). 12/28/20  Yes Verlon Au, NP  LANTUS SOLOSTAR 100 UNIT/ML Solostar Pen Inject 12-20 Units into the skin See admin instructions. Inject 12u under the skin  every morning and inject 20u under the skin at bedtime   Yes [provider]  lidocaine-prilocaine (EMLA) cream Apply 1 application topically as needed. 12/05/20  Yes Cammie Sickle,  MD  megestrol (MEGACE ES) 625 MG/5ML suspension Take 5 mLs (625 mg total) by mouth daily. 02/25/21  Yes Cammie Sickle, MD  Multiple Vitamin (MULTIVITAMIN WITH MINERALS) TABS tablet Take 1 tablet by mouth daily. One-A-Day for Women   Yes [provider]  nystatin (MYCOSTATIN) 100000 UNIT/ML suspension Take 5 mLs by mouth 2 (two) times daily as needed (throat irritation.).   Yes [provider]  omeprazole (PRILOSEC) 20 MG capsule Take 20 mg by mouth daily before breakfast.    Yes [provider]  ondansetron (ZOFRAN) 8 MG tablet Take 1 tablet (8 mg total) by mouth every 8 (eight) hours as needed for nausea or vomiting. 12/05/20  Yes Cammie Sickle, MD  prochlorperazine (COMPAZINE) 10 MG tablet Take 1 tablet (10 mg total) by mouth every 6 (six) hours as needed for nausea or vomiting. 12/05/20  Yes Cammie Sickle, MD  rosuvastatin (CRESTOR) 40 MG tablet Take 40 mg by mouth every evening.    Yes [provider]  vitamin B-12 (CYANOCOBALAMIN) 1000 MCG tablet Take 1,000 mcg by mouth daily.   Yes [provider]     Critical care provider statement:    Critical care time (minutes):  33   Critical care time was exclusive of:  Separately billable procedures and  treating other patients   Critical care was necessary to treat or prevent imminent or  life-threatening deterioration of the following conditions:  COVID19 pneumonia, lung cancer , bilateral pleural effusions, metastatic cancer, chronic severe anemia requiring blood transfusion.    Critical care was time spent personally by me on the following  activities:  Development of treatment plan with patient or surrogate,  discussions with consultants, evaluation of patient's response to  treatment,  examination of patient, obtaining history from patient or  surrogate, ordering and performing treatments and interventions, ordering  and review of laboratory studies and re-evaluation of patient's condition   I assumed direction of critical care for this patient from another  provider in my specialty: no          Ottie Glazier, M.D.  Pulmonary & [place]

## 2021-03-22 ENCOUNTER — Inpatient Hospital Stay
Admit: 2021-03-22 | Discharge: 2021-03-22 | Disposition: A | Discharge status: Home / Self Care | Payer: Medicare Other | Attending: Pulmonary Disease | Admitting: Pulmonary Disease

## 2021-03-22 ENCOUNTER — Inpatient Hospital Stay: Payer: Medicare Other

## 2021-03-22 LAB — BLOOD GAS, ARTERIAL
Acid-base deficit: 5.3 mmol/L — ABNORMAL HIGH (ref 0.0–2.0)
Bicarbonate: 18.4 mmol/L — ABNORMAL LOW (ref 20.0–28.0)
FIO2: 0.6
MECHVT: 450 mL
O2 Saturation: 94 %
PEEP: 5 cmH2O
Patient temperature: 37
RATE: 25 resp/min
pCO2 arterial: 29 mmHg — ABNORMAL LOW (ref 32.0–48.0)
pH, Arterial: 7.41 (ref 7.350–7.450)
pO2, Arterial: 70 mmHg — ABNORMAL LOW (ref 83.0–108.0)

## 2021-03-22 LAB — COMPREHENSIVE METABOLIC PANEL
ALT: 65 U/L — ABNORMAL HIGH (ref 0–44)
AST: 81 U/L — ABNORMAL HIGH (ref 15–41)
Albumin: 1.8 g/dL — ABNORMAL LOW (ref 3.5–5.0)
Alkaline Phosphatase: 205 U/L — ABNORMAL HIGH (ref 38–126)
Anion gap: 6 (ref 5–15)
BUN: 62 mg/dL — ABNORMAL HIGH (ref 8–23)
CO2: 19 mmol/L — ABNORMAL LOW (ref 22–32)
Calcium: 7.7 mg/dL — ABNORMAL LOW (ref 8.9–10.3)
Chloride: 113 mmol/L — ABNORMAL HIGH (ref 98–111)
Creatinine, Ser: 1.13 mg/dL — ABNORMAL HIGH (ref 0.44–1.00)
GFR, Estimated: 50 mL/min — ABNORMAL LOW (ref >60)
Glucose, Bld: 213 mg/dL — ABNORMAL HIGH (ref 70–99)
Potassium: 4.5 mmol/L (ref 3.5–5.1)
Sodium: 138 mmol/L (ref 135–145)
Total Bilirubin: 0.5 mg/dL (ref 0.3–1.2)
Total Protein: 5.7 g/dL — ABNORMAL LOW (ref 6.5–8.1)

## 2021-03-22 LAB — GLUCOSE, CAPILLARY
Glucose-Capillary: 138 mg/dL — ABNORMAL HIGH (ref 70–99)
Glucose-Capillary: 219 mg/dL — ABNORMAL HIGH (ref 70–99)
Glucose-Capillary: 191 mg/dL — ABNORMAL HIGH (ref 70–99)
Glucose-Capillary: 222 mg/dL — ABNORMAL HIGH (ref 70–99)
Glucose-Capillary: 144 mg/dL — ABNORMAL HIGH (ref 70–99)
Glucose-Capillary: 153 mg/dL — ABNORMAL HIGH (ref 70–99)
Glucose-Capillary: 129 mg/dL — ABNORMAL HIGH (ref 70–99)

## 2021-03-22 LAB — BRAIN NATRIURETIC PEPTIDE: B Natriuretic Peptide: 1341.1 pg/mL — ABNORMAL HIGH (ref 0.0–100.0)

## 2021-03-22 MED ORDER — MIDAZOLAM HCL 2 MG/2ML IJ SOLN
INTRAMUSCULAR | Status: AC
  Administered 2021-03-22: 4 mg via INTRAVENOUS
  Filled 2021-03-22: qty 2

## 2021-03-22 MED ORDER — PROPOFOL 1000 MG/100ML IV EMUL
INTRAVENOUS | Status: AC
  Filled 2021-03-22: qty 100

## 2021-03-22 MED ORDER — MIDAZOLAM HCL 2 MG/2ML IJ SOLN
4.0000 mg | Freq: Once | INTRAMUSCULAR | Status: AC
  Filled 2021-03-22: qty 4

## 2021-03-22 MED ORDER — FENTANYL CITRATE (PF) 100 MCG/2ML IJ SOLN
INTRAMUSCULAR | Status: AC
  Filled 2021-03-22: qty 2

## 2021-03-22 MED ORDER — ROCURONIUM BROMIDE 50 MG/5ML IV SOLN
INTRAVENOUS | Status: AC
  Filled 2021-03-22: qty 1

## 2021-03-22 MED ORDER — MIDAZOLAM HCL 2 MG/2ML IJ SOLN: 4.0000 mg | Freq: Once | INTRAMUSCULAR | Status: DC

## 2021-03-22 MED ORDER — PROPOFOL 1000 MG/100ML IV EMUL
5.0000 ug/kg/min | INTRAVENOUS | Status: DC
Start: 2021-03-22 — End: 2021-03-25
  Administered 2021-03-22: 5 ug/kg/min via INTRAVENOUS
  Administered 2021-03-23: 30 ug/kg/min via INTRAVENOUS
  Administered 2021-03-23: 45 ug/kg/min via INTRAVENOUS
  Administered 2021-03-23: 25 ug/kg/min via INTRAVENOUS
  Administered 2021-03-24: 60 ug/kg/min via INTRAVENOUS
  Administered 2021-03-24 (×4): 55 ug/kg/min via INTRAVENOUS
  Administered 2021-03-25: 50 ug/kg/min via INTRAVENOUS
  Administered 2021-03-25: 70 ug/kg/min via INTRAVENOUS
  Administered 2021-03-25: 50 ug/kg/min via INTRAVENOUS
  Filled 2021-03-22 (×11): qty 100

## 2021-03-22 MED ORDER — ETOMIDATE 2 MG/ML IV SOLN
20.0000 mg | Freq: Once | INTRAVENOUS | Status: AC
  Administered 2021-03-22: 20 mg via INTRAVENOUS
  Filled 2021-03-22: qty 10

## 2021-03-22 NOTE — Progress Notes (Signed)
*  PRELIMINARY RESULTS* Echocardiogram 2D Echocardiogram has been performed.  Donna Brennan 03/22/2021, 11:54 AM

## 2021-03-22 NOTE — Progress Notes (Signed)
NAME:  Donna Brennan, MRN:  [id number], DOB:  11/07/1945, LOS: 6 ADMISSION DATE:  03/17/2021, CONSULTATION DATE: 03/20/2021 REFERRING MD: Dr. 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 cholangio-/pancreaticobiliary).  She presented to [facility] ER from home on 06/11 with c/o weakness, productive cough, and shortness of breath.  The morning of 06/11 she tested positive for COVID via home test, she also tested positive for COVID on 06/6.  Pts husband COVID positive.  She has received 2 doses of COVID-19 vaccine, however she has not had a booster dose.     Upon arrival to the ER O2 sats were 85% on RA and pt noted to have increased work of breathing, therefore she was placed on 2L O2 via nasal canula.  CXR revealed chronic patchy density over the left mid to lower lung concerning for atelectasis vs. early superimposed infection.  CTA Chest negative for pulmonary embolism but concerning for COVID-19 pneumonia and chronic infection or aspiration.  She received iv solumedrol, empiric iv abx (rocephin and azithromycin), and remdesivir per protocol.  She was subsequently admitted to the [facility] unit per hospitalist team for additional workup and treatment.    Hospital course complicated by worsening acute hypoxic respiratory failure requiring pt transfer to ICU for emergent intubation on 06/15.    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 cholangio-/pancreaticobiliary) COPD Stage IV CKD Colon Polyps  Type II Diabetes Mellitus GERD Headache  Hepatitis  HLD IBS Tobacco Abuse Abuse  Vertigo  Significant Hospital Events: Including procedures, antibiotic start and stop dates in addition to other pertinent events   06/11: Pt admitted to the medsurg unit  06/11~06/15:  Azithromycin  06/11~06/13: Ceftriaxone  06/15: Pt transferred to ICU requiring emergent intubation per PCCM team.  I met with daughter Donna Brennan and Donna Brennan we had goals of care conference and reviewed current findings and answered questions.  At current moment they wish to continue with full scope of therapy and code status remains FULL CODE.   03/21/21- patient failed SBT due to severe cardiopulmonary distress 03/22/21- Patient is unable to pass SBT due to severe aggitation with HR >170 and severe tachypnea.  This has been recurring and even with precedex going so we discussed with family at length, Donna Brennan and Donna Brennan are both daughters they came to have goals of care performed.  They wish to consult ENT for Trache immediately.   Interim History / Subjective:  Pt with severe respiratory distress requiring emergent mechanical intubation   Objective   Blood pressure (!) 104/58, pulse 62, temperature (!) 96.4 F (35.8 C), temperature source Axillary, resp. rate (!) 25, height 5' 1.5" (1.562 m), weight 56.7 kg, SpO2 99 %.    Vent Mode: PRVC FiO2 (%):  [40 %-60 %] 40 % Set Rate:  [25 bmp] 25 bmp Vt Set:  [450 mL] 450 mL PEEP:  [10 cmH20] 10 cmH20 Plateau Pressure:  [20 cmH20] 20 cmH20   Intake/Output Summary (Last 24 hours) at 03/22/2021 1407 Last data filed at 03/22/2021 0626 Gross per 24 hour  Intake 732.47 ml  Output 250 ml  Net 482.47 ml    Filed Weights   03/26/2021 1211  Weight: 56.7 kg    Examination: General: acutely ill appearing female, NAD mechanically intubated  HENT: supple, JVD present  Lungs: rhonchi throughout,  even, non labored, cyanotic, mechanically intubated  Cardiovascular: sinus tach, no R/G, 2+ radial/1+ distal pulses, no edema  Abdomen: +BS x4, soft, non distended Extremities: normal bulk and tone, cyanotic  Neuro: sedated, not following commands, PERRL GU: purewick in place   Labs/imaging that I havepersonally reviewed  (right click and "Reselect all  SmartList Selections" daily)  06/11: CTA Chest revealed no evidence of pulmonary embolism. Development of multifocal peripheral predominant ground-glass opacities, highly suspicious for COVID-19 pneumonia. Left lower lobe pulmonary opacities are slightly progressive and likely represent a combination of COVID-19 pneumonia and chronic infection or aspiration. Similar appearance of probable radiation induced consolidation within the lingula and anterior left lower lobe. Anterior left lower lobe nodularity is similar. Subcarinal adenopathy, mild and new since 02/18/2021. Most likely reactive. Metastatic disease could look similar. Recommend attention on follow-up. Coronary artery atherosclerosis. Aortic Atherosclerosis (ICD10-I70.0). Right hepatic lobe metastasis, suboptimally evaluated secondary to bolus timing. 06/15: ABG~pH 7.42/pCO2 33/pO2 76/acid-base deficit 2.4; d-dimer 9.06 06/15: CXR~worsening airspace disease and/or edema   Resolved Hospital Problem list   N/A  Assessment & Plan:  Acute on chronic hypoxic respiratory failure secondary COVID-19 pneumonia with suspected bacterial infection vs. pulmonary edema further complicated by COPD Mechanical ventilation via ARDS protocol, target PRVC 6 cc/kg Wean PEEP and FiO2 as able to maintain O2 sats >88% Goal plateau pressure less than 30, driving pressure less than 15 Paralytics if necessary for vent synchrony, gas exchange Cycle prone positioning if necessary for oxygenation Deep sedation per PAD protocol, goal RASS -3, currently fentanyl and propofol  Diuresis as blood pressure and renal function can tolerate, goal CVP 5-8.  BNP pending  VAP prevention order set Remdesivir, plan for 5 days Steroids Follow inflammatory markers: Ferritin, D-dimer, CRP, IL-6, LDH Vitamin C, zinc Will check resp cultures  Will check pct if elevated will started cefepime  Sinus tachycardia  Continuous telemetry monitoring Will check troponin    Hypokalemia  Hypomagnesia Trend BMP  Replace electrolytes as indicated  Monitor UOP Avoid nephrotoxic medications   Pancytopenia likely secondary to stage IV poorly differentiated adenocarcinoma with unclear primary (suspected to be lung vs. hepatobiliary) Trend CBC Monitor for s/sx of bleeding and transfuse for hgb <7  Type II diabetes mellitus  CBG q4hrs SSI   GERD Scheduled IV protonix   Best practice (right click and "Reselect all SmartList Selections" daily)  Diet:  NPO Pain/Anxiety/Delirium protocol (if indicated): Yes (RASS goal -3) VAP protocol (if indicated): Yes DVT prophylaxis: LMWH GI prophylaxis: PPI Glucose control:  SSI Yes Central venous access: Right chest portacath  Arterial line:  N/A Foley:  N/A Mobility:  bed rest  PT consulted: N/A Last date of multidisciplinary goals of care discussion [N/A] Code Status:  full code Disposition: ICU  Labs   CBC: Recent Labs  Lab 03/17/21 0648 03/18/21 0521 03/19/21 0434 03/20/21 0457 03/21/21 0614  WBC 3.7* 3.0* 3.2* 8.1 5.9  NEUTROABS 3.1 2.3 2.8 7.4 5.2  HGB 7.6* 7.6* 7.4* 11.6* 9.4*  HCT 23.2* 23.0* 21.7* 33.8* 27.1*  MCV 103.1* 101.3* 98.6 95.2 95.8  PLT 127* 142* 132* 152 108*     Basic Metabolic Panel: Recent Labs  Lab 03/18/21 0521 03/19/21 0434 03/20/21 0457 03/20/21 1413 03/21/21 0614 03/22/21 0504  NA 137 141 142  --  138 138  K 3.2* 3.2* 2.9* 4.6 4.6 4.5  CL 110 112* 107  --  111 113*  CO2 21* 23 21*  --  22 19*  GLUCOSE 297* 265* 138*  --  223* 213*  BUN 29* 29* 25*  --  47* 62*  CREATININE 1.04* 1.03* 0.93  --  1.20* 1.13*  CALCIUM 7.6* 7.9* 7.7*  --  7.4* 7.7*  MG 1.5* 1.7 1.7 1.6* 2.0  --   PHOS 3.0 2.1* 4.8* 3.5 3.6  --     GFR: Estimated Creatinine Clearance: 32.8 mL/min (A) (by C-G formula based on SCr of 1.13 mg/dL (H)). Recent Labs  Lab 03/26/2021 1225 03/31/2021 1226 03/19/2021 1408 03/17/21 9833 03/18/21 0521 03/19/21 0434 03/20/21 0457 03/20/21 0652  03/21/21 0614 03/21/21 1240  PROCALCITON  --  0.56  --   --  0.49  --   --  0.69  --  0.51  WBC 12.8*  --   --    < > 3.0* 3.2* 8.1  --  5.9  --   LATICACIDVEN 4.8*  --  1.4  --   --   --   --   --   --   --    < > = values in this interval not displayed.     Liver Function Tests: Recent Labs  Lab 03/18/21 0521 03/19/21 0434 03/20/21 0457 03/21/21 0614 03/22/21 0504  AST 26 42* 184* 92* 81*  ALT 14 22 74* 67* 65*  ALKPHOS 67 142* 248* 196* 205*  BILITOT 0.4 0.4 0.3 0.4 0.5  PROT 5.9* 6.0* 6.7 5.6* 5.7*  ALBUMIN 1.6* 1.6* 1.8* 1.5* 1.8*    No results for input(s): LIPASE, AMYLASE in the last 168 hours. No results for input(s): AMMONIA in the last 168 hours.  ABG    Component Value Date/Time   PHART 7.42 03/20/2021 0515   PCO2ART 33 03/20/2021 0515   PO2ART 76 (L) 03/20/2021 0515   HCO3 21.4 03/20/2021 0515   ACIDBASEDEF 2.4 (H) 03/20/2021 0515   O2SAT 95.3 03/20/2021 0515      Coagulation Profile: Recent Labs  Lab 03/28/2021 1225 03/17/21 0600  INR 1.2 1.4*     Cardiac Enzymes: No results for input(s): CKTOTAL, CKMB, CKMBINDEX, TROPONINI in the last 168 hours.  HbA1C: Hgb A1c MFr Bld  Date/Time Value Ref Range Status  03/19/2021 04:34 AM 7.5 (H) 4.8 - 5.6 % Final    Comment:    (NOTE)         Prediabetes: 5.7 - 6.4         Diabetes: >6.4         Glycemic control for adults with diabetes: <7.0     CBG: Recent Labs  Lab 03/21/21 2003 03/22/21 0006 03/22/21 0434 03/22/21 0726 03/22/21 1139  GLUCAP 133* 138* 219* 191* 222*     Review of Systems:   Pt mechanically intubated   Past Medical History:  She,  has a past medical history of CKD (chronic kidney disease), stage IV ([place]), Colon polyps, COPD (chronic obstructive pulmonary disease) ([place]), Diabetes mellitus without complication ([place]), GERD (gastroesophageal reflux disease), Headache, Hepatitis, Hyperlipidemia, IBS (irritable bowel syndrome), Peptic ulcer, Tobacco abuse, Vertigo, and Wears  dentures.   Surgical History:   Past Surgical History:  Procedure Laterality Date   ABDOMINAL HYSTERECTOMY     carbuncle removal     urethra   CATARACT EXTRACTION W/PHACO Left 10/29/2020   Procedure: CATARACT EXTRACTION PHACO AND INTRAOCULAR LENS PLACEMENT (IOC) LEFT DIABETIC 1051 01:00.0 ;  Surgeon: Eulogio Bear, MD;  Location: [place];  Service: Ophthalmology;  Laterality: Left;  Diabetic - insulin   CATARACT EXTRACTION W/PHACO Right 11/19/2020   Procedure: CATARACT EXTRACTION PHACO AND  INTRAOCULAR LENS PLACEMENT (IOC) RIGHT DIABETIC;  Surgeon: Eulogio Bear, MD;  Location: [place];  Service: Ophthalmology;  Laterality: Right;  16.54 1:40.0   COLONOSCOPY     COLONOSCOPY WITH PROPOFOL N/A 11/05/2017   Procedure: COLONOSCOPY WITH PROPOFOL;  Surgeon: Lollie Sails, MD;  Location[facility] ENDOSCOPY;  Service: Endoscopy;  Laterality: N/A;   ESOPHAGOGASTRODUODENOSCOPY     ESOPHAGOGASTRODUODENOSCOPY (EGD) WITH PROPOFOL N/A 11/05/2017   Procedure: ESOPHAGOGASTRODUODENOSCOPY (EGD) WITH PROPOFOL;  Surgeon: Lollie Sails, MD;  Location: [facility] ENDOSCOPY;  Service: Endoscopy;  Laterality: N/A;   IR IMAGING GUIDED PORT INSERTION  12/13/2020   IR IMAGING GUIDED PORT INSERTION  01/25/2021   VIDEO BRONCHOSCOPY WITH ENDOBRONCHIAL NAVIGATION N/A 12/02/2019   Procedure: VIDEO BRONCHOSCOPY WITH ENDOBRONCHIAL NAVIGATION;  Surgeon: Ottie Glazier, MD;  Location: [facility] ORS;  Service: Thoracic;  Laterality: N/A;   VIDEO BRONCHOSCOPY WITH ENDOBRONCHIAL ULTRASOUND N/A 12/02/2019   Procedure: VIDEO BRONCHOSCOPY WITH ENDOBRONCHIAL ULTRASOUND;  Surgeon: Ottie Glazier, MD;  Location: [facility] ORS;  Service: Thoracic;  Laterality: N/A;     Social History:   reports that she has been smoking cigarettes. She has a 11.25 pack-year smoking history. She has never used smokeless tobacco. She reports that she does not drink alcohol and does not use drugs.   Family History:  Her family  history is negative for Breast cancer.   Allergies Allergies  Allergen Reactions   Penicillins Anaphylaxis and Rash    Did it involve swelling of the face/tongue/throat, SOB, or low BP? Yes Did it involve sudden or severe rash/hives, skin peeling, or any reaction on the inside of your mouth or nose? Yes Did you need to seek medical attention at a hospital or doctor's office? Yes When did it last happen?More than 40 years ago If all above answers are "NO", may proceed with cephalosporin use.    Metformin Other (See Comments)    Upset stomach   Sulfa Antibiotics Nausea And Vomiting     Home Medications  Prior to Admission medications   Medication Sig Start Date End Date Taking? Authorizing Provider  acetaminophen (TYLENOL) 500 MG tablet Take 1,000 mg by mouth every 6 (six) hours as needed for mild pain or moderate pain.   Yes [provider]  albuterol (VENTOLIN HFA) 108 (90 Base) MCG/ACT inhaler Inhale 1-2 puffs into the lungs every 6 (six) hours as needed for wheezing or shortness of breath. 02/10/20  Yes Cammie Sickle, MD  chlorpheniramine-HYDROcodone (TUSSIONEX) 10-8 MG/5ML SUER Take 5 mLs by mouth at bedtime as needed for cough. Patient taking differently: Take 5 mLs by mouth 3 (three) times daily as needed for cough. 02/08/21  Yes Cammie Sickle, MD  diazepam (VALIUM) 5 MG tablet Take 5 mg by mouth daily as needed (for vertigo).   Yes [provider]  ezetimibe (ZETIA) 10 MG tablet Take 10 mg by mouth daily.    Yes [provider]  Fluticasone-Salmeterol (ADVAIR) 250-50 MCG/DOSE AEPB Inhale 1 puff into the lungs 2 (two) times daily.   Yes [provider]  folic acid (FOLVITE) 1 MG tablet Take 1 tablet (1 mg total) by mouth daily. 12/14/20  Yes Cammie Sickle, MD  hyoscyamine (LEVBID) 0.375 MG 12 hr tablet Take 0.375 mg by mouth daily as needed (abdominal cramping).  03/24/19  Yes [provider]  Ipratropium-Albuterol  (COMBIVENT) 20-100 MCG/ACT AERS respimat Inhale 1 puff into the lungs every 4 (four) hours as needed for wheezing.   Yes [provider]  ipratropium-albuterol (DUONEB) 0.5-2.5 (3) MG/3ML SOLN Take 3 mLs by nebulization every 6 (six) hours as needed (wheezing). 12/28/20  Yes Verlon Au, NP  LANTUS SOLOSTAR 100 UNIT/ML Solostar Pen Inject 12-20 Units into the skin See admin instructions. Inject 12u under the skin every morning and inject 20u under the skin at bedtime   Yes [provider]  lidocaine-prilocaine (EMLA) cream Apply 1 application topically as needed. 12/05/20  Yes Cammie Sickle, MD  megestrol (MEGACE ES) 625 MG/5ML suspension Take 5 mLs (625 mg total) by mouth daily. 02/25/21  Yes Cammie Sickle, MD  Multiple Vitamin (MULTIVITAMIN WITH MINERALS) TABS tablet Take 1 tablet by mouth daily. One-A-Day for Women   Yes [provider]  nystatin (MYCOSTATIN) 100000 UNIT/ML suspension Take 5 mLs by mouth 2 (two) times daily as needed (throat irritation.).   Yes [provider]  omeprazole (PRILOSEC) 20 MG capsule Take 20 mg by mouth daily before breakfast.    Yes [provider]  ondansetron (ZOFRAN) 8 MG tablet Take 1 tablet (8 mg total) by mouth every 8 (eight) hours as needed for nausea or vomiting. 12/05/20  Yes Cammie Sickle, MD  prochlorperazine (COMPAZINE) 10 MG tablet Take 1 tablet (10 mg total) by mouth every 6 (six) hours as needed for nausea or vomiting. 12/05/20  Yes Cammie Sickle, MD  rosuvastatin (CRESTOR) 40 MG tablet Take 40 mg by mouth every evening.    Yes [provider]  vitamin B-12 (CYANOCOBALAMIN) 1000 MCG tablet Take 1,000 mcg by mouth daily.   Yes [provider]     Critical care provider statement:    Critical care time (minutes):  33   Critical care time was exclusive of:  Separately billable procedures and  treating other patients   Critical care was necessary to treat or  prevent imminent or  life-threatening deterioration of the following conditions:  COVID19 pneumonia, lung cancer , bilateral pleural effusions, metastatic cancer, chronic severe anemia requiring blood transfusion.    Critical care was time spent personally by me on the following  activities:  Development of treatment plan with patient or surrogate,  discussions with consultants, evaluation of patient's response to  treatment, examination of patient, obtaining history from patient or  surrogate, ordering and performing treatments and interventions, ordering  and review of laboratory studies and re-evaluation of patient's condition   I assumed direction of critical care for this patient from another  provider in my specialty: no          Ottie Glazier, M.D.  Pulmonary & [place]

## 2021-03-22 NOTE — Progress Notes (Signed)
This AM, SAT attempted. Tube feeds stopped. Fentanyl weaned from 150 to 75 & Precedex 1 to 0.4. Pt became increasingly agitated/anxious/grabbing at tube and went into AFIB (HR 150-170s). 100 mcg bolus of fent given. Dr. Lanney Gins @ bedside and aware. Spoke w/ daughter regarding inability to conduct proper SAT/SBT at this time.   Sedation turned back up. Pt re-sedated.  Pt remains on multiple gtt's:  Fent @ 100 Precedex @ 1  Neo @ 5  Turn q2. Pt in no acute distress @ this time. Will continue to monitor.

## 2021-03-22 NOTE — Procedures (Addendum)
Intubation Procedure Note  Donna Brennan  [id number]  04/09/1946  Date:03/22/21  Time:8:00 PM    Upon sign out rounds with Tana Conch, NP patient was seen in room having self-extubated. Difficulty obtaining SpO2, patient with severe work of breathing, utilizing accessory muscles. Patient only able to speak one word sentences, NRB placed, RT called bedside while nursing helped set up to emergently re-intubate the patient.  Provider Performing:Jaideep Pollack L Rust-Chester    Procedure: Intubation (31500)  Indication(s) Respiratory Failure  Consent Unable to obtain consent due to emergent nature of procedure.   Anesthesia Etomidate, Versed, and Fentanyl   Time Out Verified patient identification, verified procedure, site/side was marked, verified correct patient position, special equipment/implants available, medications/allergies/relevant history reviewed, required imaging and test results available.   Sterile Technique Usual hand hygeine, masks, and gloves were used   Procedure Description Patient positioned in bed supine.  Sedation given as noted above.  Patient was intubated with endotracheal tube using Glidescope.  View was Grade 1 full glottis .  Number of attempts was 1.  Colorimetric CO2 detector was consistent with tracheal placement.   Complications/Tolerance None; patient tolerated the procedure well. Chest X-ray is ordered to verify placement.   EBL Minimal   Specimen(s) None   Both daughters and husband updated via phone of current status. All questions/concerns answered at this time.   Domingo Pulse Rust-Chester, AGACNP-BC Acute Care Nurse Practitioner [facility]   [phone] / [phone] Please see Amion for pager details.

## 2021-03-22 NOTE — Progress Notes (Signed)
This Probation officer when into the room to change an IV medication and to turn the patient. Once the IV medication was changed and restarted, I proceeded to turn her to the right. She woke up and became very agitated, flapping her hands and trying to get out of bed. She has on mitts. She reached for her ETT tube trying to pull it out but it is still secured at 22. Redirection was attempted to no avail. In thrashing, she disconnected her ventilation tubes and her heart rate dropped in the 30s and work of breathing increased. Tubing was reconnected and heart rate increased to the 120s and converted to afib. During this time, a bolus of fentanyl was given per order and the patient slowly began to calm down. Patient is now stable. Vitals: BP-110/67, HR 67, R-22, O2 sat 98%. She converted back to NSR in about 15 minutes. Will continue to monitor.

## 2021-03-22 NOTE — Progress Notes (Signed)
Called to patient's room by NP. Patient had self-extubated. Patient with labored respirations, increased work of breathing, and increased heart rate. SpO2 was 90%. Patient was placed on 100% NRB by NP. Patient was subsequently re-intubated with a 7.0 ETT at 23cm at the right lip. Breath sounds equal. Positive color change on EtCO2. CXR ordered and verified. Patient returned to previous ventilator settings. Will continue to monitor.

## 2021-03-22 NOTE — Progress Notes (Signed)
NAME:  Donna Brennan, MRN:  [id number], DOB:  01-06-1946, LOS: 6 ADMISSION DATE:  03/10/2021, CONSULTATION DATE: 03/20/2021 REFERRING MD: Dr. 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 cholangio-/pancreaticobiliary).  She presented to [facility] ER from home on 06/11 with c/o weakness, productive cough, and shortness of breath.  The morning of 06/11 she tested positive for COVID via home test, she also tested positive for COVID on 06/6.  Pts husband COVID positive.  She has received 2 doses of COVID-19 vaccine, however she has not had a booster dose.     Upon arrival to the ER O2 sats were 85% on RA and pt noted to have increased work of breathing, therefore she was placed on 2L O2 via nasal canula.  CXR revealed chronic patchy density over the left mid to lower lung concerning for atelectasis vs. early superimposed infection.  CTA Chest negative for pulmonary embolism but concerning for COVID-19 pneumonia and chronic infection or aspiration.  She received iv solumedrol, empiric iv abx (rocephin and azithromycin), and remdesivir per protocol.  She was subsequently admitted to the [facility] unit per hospitalist team for additional workup and treatment.    Hospital course complicated by worsening acute hypoxic respiratory failure requiring pt transfer to ICU for emergent intubation on 06/15.    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 cholangio-/pancreaticobiliary) COPD Stage IV CKD Colon Polyps  Type II Diabetes Mellitus GERD Headache  Hepatitis  HLD IBS Tobacco Abuse Abuse  Vertigo  Significant Hospital Events: Including procedures, antibiotic start and stop dates in addition to other pertinent events   06/11: Pt admitted to the medsurg unit  06/11~06/15:  Azithromycin  06/11~06/13: Ceftriaxone  06/15: Pt transferred to ICU requiring emergent intubation per PCCM team.  I met with daughter [name] and Silva Bandy we had goals of care conference and reviewed current findings and answered questions.  At current moment they wish to continue with full scope of therapy and code status remains FULL CODE.   03/21/21- patient failed SBT due to severe cardiopulmonary distress 03/22/21- Patient is unable to pass SBT due to severe aggitation with HR >170 and severe tachypnea.  This has been recurring and even with precedex going so we discussed with family at length, [name] and Silva Bandy are both daughters they came to have goals of care performed.  They wish to consult ENT for Trache immediately.   Interim History / Subjective:  Pt with severe respiratory distress requiring emergent mechanical intubation   Objective   Blood pressure (!) 91/59, pulse (!) 50, temperature (!) 97.2 F (36.2 C), temperature source Oral, resp. rate 12, height 5' 1.5" (1.562 m), weight 56.7 kg, SpO2 (!) 84 %.    Vent Mode: PRVC FiO2 (%):  [30 %-60 %] 40 % Set Rate:  [25 bmp] 25 bmp Vt Set:  [450 mL] 450 mL PEEP:  [5 cmH20-10 cmH20] 10 cmH20 Pressure Support:  [5 cmH20] 5 cmH20 Plateau Pressure:  [20 cmH20] 20 cmH20   Intake/Output Summary (Last 24 hours) at 03/22/2021 0856 Last data filed at 03/22/2021 0626 Gross per 24 hour  Intake 732.47 ml  Output 250 ml  Net 482.47 ml    Filed Weights   03/06/2021 1211  Weight: 56.7 kg    Examination: General: acutely ill appearing female, NAD mechanically intubated  HENT: supple, JVD present  Lungs: rhonchi throughout, even, non labored, cyanotic, mechanically intubated  Cardiovascular: sinus tach, no R/G, 2+ radial/1+ distal pulses, no edema  Abdomen: +BS x4, soft, non distended Extremities: normal bulk and tone, cyanotic  Neuro: sedated, not following commands, PERRL GU: purewick in place   Labs/imaging that I havepersonally  reviewed  (right click and "Reselect all SmartList Selections" daily)  06/11: CTA Chest revealed no evidence of pulmonary embolism. Development of multifocal peripheral predominant ground-glass opacities, highly suspicious for COVID-19 pneumonia. Left lower lobe pulmonary opacities are slightly progressive and likely represent a combination of COVID-19 pneumonia and chronic infection or aspiration. Similar appearance of probable radiation induced consolidation within the lingula and anterior left lower lobe. Anterior left lower lobe nodularity is similar. Subcarinal adenopathy, mild and new since 02/18/2021. Most likely reactive. Metastatic disease could look similar. Recommend attention on follow-up. Coronary artery atherosclerosis. Aortic Atherosclerosis (ICD10-I70.0). Right hepatic lobe metastasis, suboptimally evaluated secondary to bolus timing. 06/15: ABG~pH 7.42/pCO2 33/pO2 76/acid-base deficit 2.4; d-dimer 9.06 06/15: CXR~worsening airspace disease and/or edema   Resolved Hospital Problem list   N/A  Assessment & Plan:  Acute on chronic hypoxic respiratory failure secondary COVID-19 pneumonia with suspected bacterial infection vs. pulmonary edema further complicated by COPD Mechanical ventilation via ARDS protocol, target PRVC 6 cc/kg Wean PEEP and FiO2 as able to maintain O2 sats >88% Goal plateau pressure less than 30, driving pressure less than 15 Paralytics if necessary for vent synchrony, gas exchange Cycle prone positioning if necessary for oxygenation Deep sedation per PAD protocol, goal RASS -3, currently fentanyl and propofol  Diuresis as blood pressure and renal function can tolerate, goal CVP 5-8.  BNP pending  VAP prevention order set Remdesivir, plan for 5 days Steroids Follow inflammatory markers: Ferritin, D-dimer, CRP, IL-6, LDH Vitamin C, zinc Will check resp cultures  Will check pct if elevated will started cefepime  Sinus tachycardia  Continuous telemetry  monitoring Will check troponin   Hypokalemia  Hypomagnesia Trend BMP  Replace electrolytes as indicated  Monitor UOP Avoid nephrotoxic medications   Pancytopenia likely secondary to stage IV poorly differentiated adenocarcinoma with unclear primary (suspected to be lung vs. hepatobiliary) Trend CBC Monitor for s/sx of bleeding and transfuse for hgb <7  Type II diabetes mellitus  CBG q4hrs SSI   GERD Scheduled IV protonix   Best practice (right click and "Reselect all SmartList Selections" daily)  Diet:  NPO Pain/Anxiety/Delirium protocol (if indicated): Yes (RASS goal -3) VAP protocol (if indicated): Yes DVT prophylaxis: LMWH GI prophylaxis: PPI Glucose control:  SSI Yes Central venous access: Right chest portacath  Arterial line:  N/A Foley:  N/A Mobility:  bed rest  PT consulted: N/A Last date of multidisciplinary goals of care discussion [N/A] Code Status:  full code Disposition: ICU  Labs   CBC: Recent Labs  Lab 03/17/21 0648 03/18/21 0521 03/19/21 0434 03/20/21 0457 03/21/21 0614  WBC 3.7* 3.0* 3.2* 8.1 5.9  NEUTROABS 3.1 2.3 2.8 7.4 5.2  HGB 7.6* 7.6* 7.4* 11.6* 9.4*  HCT 23.2* 23.0* 21.7* 33.8* 27.1*  MCV 103.1* 101.3* 98.6 95.2 95.8  PLT 127* 142* 132* 152 108*     Basic Metabolic Panel: Recent Labs  Lab 03/18/21 0521 03/19/21 0434 03/20/21 0457 03/20/21 1413 03/21/21 0614 03/22/21 0504  NA 137 141 142  --  138 138  K 3.2* 3.2* 2.9* 4.6 4.6 4.5  CL 110 112* 107  --  111 113*  CO2 21* 23 21*  --  22  19*  GLUCOSE 297* 265* 138*  --  223* 213*  BUN 29* 29* 25*  --  47* 62*  CREATININE 1.04* 1.03* 0.93  --  1.20* 1.13*  CALCIUM 7.6* 7.9* 7.7*  --  7.4* 7.7*  MG 1.5* 1.7 1.7 1.6* 2.0  --   PHOS 3.0 2.1* 4.8* 3.5 3.6  --     GFR: Estimated Creatinine Clearance: 32.8 mL/min (A) (by C-G formula based on SCr of 1.13 mg/dL (H)). Recent Labs  Lab 03/28/2021 1225 03/29/2021 1226 03/07/2021 1408 03/17/21 1497 03/18/21 0521 03/19/21 0434  03/20/21 0457 03/20/21 0652 03/21/21 0614 03/21/21 1240  PROCALCITON  --  0.56  --   --  0.49  --   --  0.69  --  0.51  WBC 12.8*  --   --    < > 3.0* 3.2* 8.1  --  5.9  --   LATICACIDVEN 4.8*  --  1.4  --   --   --   --   --   --   --    < > = values in this interval not displayed.     Liver Function Tests: Recent Labs  Lab 03/18/21 0521 03/19/21 0434 03/20/21 0457 03/21/21 0614 03/22/21 0504  AST 26 42* 184* 92* 81*  ALT 14 22 74* 67* 65*  ALKPHOS 67 142* 248* 196* 205*  BILITOT 0.4 0.4 0.3 0.4 0.5  PROT 5.9* 6.0* 6.7 5.6* 5.7*  ALBUMIN 1.6* 1.6* 1.8* 1.5* 1.8*    No results for input(s): LIPASE, AMYLASE in the last 168 hours. No results for input(s): AMMONIA in the last 168 hours.  ABG    Component Value Date/Time   PHART 7.42 03/20/2021 0515   PCO2ART 33 03/20/2021 0515   PO2ART 76 (L) 03/20/2021 0515   HCO3 21.4 03/20/2021 0515   ACIDBASEDEF 2.4 (H) 03/20/2021 0515   O2SAT 95.3 03/20/2021 0515      Coagulation Profile: Recent Labs  Lab 03/15/2021 1225 03/17/21 0600  INR 1.2 1.4*     Cardiac Enzymes: No results for input(s): CKTOTAL, CKMB, CKMBINDEX, TROPONINI in the last 168 hours.  HbA1C: Hgb A1c MFr Bld  Date/Time Value Ref Range Status  03/19/2021 04:34 AM 7.5 (H) 4.8 - 5.6 % Final    Comment:    (NOTE)         Prediabetes: 5.7 - 6.4         Diabetes: >6.4         Glycemic control for adults with diabetes: <7.0     CBG: Recent Labs  Lab 03/21/21 1442 03/21/21 2003 03/22/21 0006 03/22/21 0434 03/22/21 0726  GLUCAP 125* 133* 138* 219* 191*     Review of Systems:   Pt mechanically intubated   Past Medical History:  She,  has a past medical history of CKD (chronic kidney disease), stage IV ([place]), Colon polyps, COPD (chronic obstructive pulmonary disease) ([place]), Diabetes mellitus without complication ([place]), GERD (gastroesophageal reflux disease), Headache, Hepatitis, Hyperlipidemia, IBS (irritable bowel syndrome), Peptic ulcer,  Tobacco abuse, Vertigo, and Wears dentures.   Surgical History:   Past Surgical History:  Procedure Laterality Date   ABDOMINAL HYSTERECTOMY     carbuncle removal     urethra   CATARACT EXTRACTION W/PHACO Left 10/29/2020   Procedure: CATARACT EXTRACTION PHACO AND INTRAOCULAR LENS PLACEMENT (IOC) LEFT DIABETIC 1051 01:00.0 ;  Surgeon: Eulogio Bear, MD;  Location: [place];  Service: Ophthalmology;  Laterality: Left;  Diabetic - insulin   CATARACT EXTRACTION W/PHACO  Right 11/19/2020   Procedure: CATARACT EXTRACTION PHACO AND INTRAOCULAR LENS PLACEMENT (IOC) RIGHT DIABETIC;  Surgeon: Eulogio Bear, MD;  Location: [place];  Service: Ophthalmology;  Laterality: Right;  16.54 1:40.0   COLONOSCOPY     COLONOSCOPY WITH PROPOFOL N/A 11/05/2017   Procedure: COLONOSCOPY WITH PROPOFOL;  Surgeon: Lollie Sails, MD;  [facility] ENDOSCOPY;  Service: Endoscopy;  Laterality: N/A;   ESOPHAGOGASTRODUODENOSCOPY     ESOPHAGOGASTRODUODENOSCOPY (EGD) WITH PROPOFOL N/A 11/05/2017   Procedure: ESOPHAGOGASTRODUODENOSCOPY (EGD) WITH PROPOFOL;  Surgeon: Lollie Sails, MD;  Location[facility]COPY;  Service: Endoscopy;  Laterality: N/A;   IR IMAGING GUIDED PORT INSERTION  12/13/2020   IR IMAGING GUIDED PORT INSERTION  01/25/2021   VIDEO BRONCHOSCOPY WITH ENDOBRONCHIAL NAVIGATION N/A 12/02/2019   Procedure: VIDEO BRONCHOSCOPY WITH ENDOBRONCHIAL NAVIGATION;  Surgeon: Ottie Glazier, MD;  Location: [facility];  Service: Thoracic;  Laterality: N/A;   VIDEO BRONCHOSCOPY WITH ENDOBRONCHIAL ULTRASOUND N/A 12/02/2019   Procedure: VIDEO BRONCHOSCOPY WITH ENDOBRONCHIAL ULTRASOUND;  Surgeon: Ottie Glazier, MD;  Location: [facility] ORS;  Service: Thoracic;  Laterality: N/A;     Social History:   reports that she has been smoking cigarettes. She has a 11.25 pack-year smoking history. She has never used smokeless tobacco. She reports that she does not drink alcohol and does not use drugs.    Family History:  Her family history is negative for Breast cancer.   Allergies Allergies  Allergen Reactions   Penicillins Anaphylaxis and Rash    Did it involve swelling of the face/tongue/throat, SOB, or low BP? Yes Did it involve sudden or severe rash/hives, skin peeling, or any reaction on the inside of your mouth or nose? Yes Did you need to seek medical attention at a hospital or doctor's office? Yes When did it last happen?More than 40 years ago If all above answers are "NO", may proceed with cephalosporin use.    Metformin Other (See Comments)    Upset stomach   Sulfa Antibiotics Nausea And Vomiting     Home Medications  Prior to Admission medications   Medication Sig Start Date End Date Taking? Authorizing Provider  acetaminophen (TYLENOL) 500 MG tablet Take 1,000 mg by mouth every 6 (six) hours as needed for mild pain or moderate pain.   Yes [provider]  albuterol (VENTOLIN HFA) 108 (90 Base) MCG/ACT inhaler Inhale 1-2 puffs into the lungs every 6 (six) hours as needed for wheezing or shortness of breath. 02/10/20  Yes Cammie Sickle, MD  chlorpheniramine-HYDROcodone (TUSSIONEX) 10-8 MG/5ML SUER Take 5 mLs by mouth at bedtime as needed for cough. Patient taking differently: Take 5 mLs by mouth 3 (three) times daily as needed for cough. 02/08/21  Yes Cammie Sickle, MD  diazepam (VALIUM) 5 MG tablet Take 5 mg by mouth daily as needed (for vertigo).   Yes [provider]  ezetimibe (ZETIA) 10 MG tablet Take 10 mg by mouth daily.    Yes [provider]  Fluticasone-Salmeterol (ADVAIR) 250-50 MCG/DOSE AEPB Inhale 1 puff into the lungs 2 (two) times daily.   Yes [provider]  folic acid (FOLVITE) 1 MG tablet Take 1 tablet (1 mg total) by mouth daily. 12/14/20  Yes Cammie Sickle, MD  hyoscyamine (LEVBID) 0.375 MG 12 hr tablet Take 0.375 mg by mouth daily as needed (abdominal cramping).  03/24/19  Yes [provider]  Ipratropium-Albuterol (COMBIVENT) 20-100 MCG/ACT AERS respimat Inhale 1 puff into the lungs every 4 (four) hours as needed  for wheezing.   Yes [provider]  ipratropium-albuterol (DUONEB) 0.5-2.5 (3) MG/3ML SOLN Take 3 mLs by nebulization every 6 (six) hours as needed (wheezing). 12/28/20  Yes Verlon Au, NP  LANTUS SOLOSTAR 100 UNIT/ML Solostar Pen Inject 12-20 Units into the skin See admin instructions. Inject 12u under the skin every morning and inject 20u under the skin at bedtime   Yes [provider]  lidocaine-prilocaine (EMLA) cream Apply 1 application topically as needed. 12/05/20  Yes Cammie Sickle, MD  megestrol (MEGACE ES) 625 MG/5ML suspension Take 5 mLs (625 mg total) by mouth daily. 02/25/21  Yes Cammie Sickle, MD  Multiple Vitamin (MULTIVITAMIN WITH MINERALS) TABS tablet Take 1 tablet by mouth daily. One-A-Day for Women   Yes [provider]  nystatin (MYCOSTATIN) 100000 UNIT/ML suspension Take 5 mLs by mouth 2 (two) times daily as needed (throat irritation.).   Yes [provider]  omeprazole (PRILOSEC) 20 MG capsule Take 20 mg by mouth daily before breakfast.    Yes [provider]  ondansetron (ZOFRAN) 8 MG tablet Take 1 tablet (8 mg total) by mouth every 8 (eight) hours as needed for nausea or vomiting. 12/05/20  Yes Cammie Sickle, MD  prochlorperazine (COMPAZINE) 10 MG tablet Take 1 tablet (10 mg total) by mouth every 6 (six) hours as needed for nausea or vomiting. 12/05/20  Yes Cammie Sickle, MD  rosuvastatin (CRESTOR) 40 MG tablet Take 40 mg by mouth every evening.    Yes [provider]  vitamin B-12 (CYANOCOBALAMIN) 1000 MCG tablet Take 1,000 mcg by mouth daily.   Yes [provider]     Critical care provider statement:    Critical care time (minutes):  33   Critical care time was exclusive of:  Separately billable procedures and  treating other patients   Critical care was  necessary to treat or prevent imminent or  life-threatening deterioration of the following conditions:  COVID19 pneumonia, lung cancer , bilateral pleural effusions, metastatic cancer, chronic severe anemia requiring blood transfusion.    Critical care was time spent personally by me on the following  activities:  Development of treatment plan with patient or surrogate,  discussions with consultants, evaluation of patient's response to  treatment, examination of patient, obtaining history from patient or  surrogate, ordering and performing treatments and interventions, ordering  and review of laboratory studies and re-evaluation of patient's condition   I assumed direction of critical care for this patient from another  provider in my specialty: no          Ottie Glazier, M.D.  [facility] [place]

## 2021-03-22 NOTE — Consult Note (Signed)
Donna Brennan, Donna Brennan [id number] 1946-03-05 Donna Glazier, MD  Reason for Consult: Possible tracheostomy tube placement  HPI: Chart thoroughly reviewed.  Patient admitted with COVID-pneumonia in addition to lung cancer for which she is undergoing chemotherapy.  She was admitted on the 11th and intubated on the 15th of June.  Speaking with her daughter and with Dr. Lanney Gins, Donna Brennan becomes very agitated when her sedation is weaned.  ENT has been consulted to discuss possible tracheostomy tube placement.  Allergies:  Allergies  Allergen Reactions   Penicillins Anaphylaxis and Rash    Did it involve swelling of the face/tongue/throat, SOB, or low BP? Yes Did it involve sudden or severe rash/hives, skin peeling, or any reaction on the inside of your mouth or nose? Yes Did you need to seek medical attention at a hospital or doctor's office? Yes When did it last happen?More than 40 years ago If all above answers are "NO", may proceed with cephalosporin use.    Metformin Other (See Comments)    Upset stomach   Sulfa Antibiotics Nausea And Vomiting    ROS: Review of systems normal other than 12 systems except per HPI.  PMH:  Past Medical History:  Diagnosis Date   CKD (chronic kidney disease), stage IV (HCC)    Colon polyps    COPD (chronic obstructive pulmonary disease) (HCC)    Diabetes mellitus without complication (HCC)    GERD (gastroesophageal reflux disease)    Headache    Hepatitis    when in 4th or 5th grade.  Resolved   Hyperlipidemia    IBS (irritable bowel syndrome)    Peptic ulcer    Tobacco abuse    Vertigo    No "bad" episodes in over 1 year   Wears dentures    partial ujpper and lower    FH:  Family History  Problem Relation Age of Onset   Breast cancer Neg Hx     SH:  Social History   Socioeconomic History   Marital status: Married    Spouse name: wayne   Number of children: 6   Years of education: Not on file   Highest education level: Not on file   Occupational History   Not on file  Tobacco Use   Smoking status: Every Day    Packs/day: 0.25    Years: 45.00    Pack years: 11.25    Types: Cigarettes    Last attempt to quit: 08/06/2020    Years since quitting: 0.6   Smokeless tobacco: Never  Vaping Use   Vaping Use: Never used  Substance and Sexual Activity   Alcohol use: No   Drug use: No   Sexual activity: Not on file  Other Topics Concern   Not on file  Social History Narrative   Smoking "all life"; 4 cig/day; rare alcohol; worked in [employer]; 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 lives in [place].     Social Determinants of Health   Financial Resource Strain: Not on file  Food Insecurity: Not on file  Transportation Needs: Not on file  Physical Activity: Not on file  Stress: Not on file  Social Connections: Not on file  Intimate Partner Violence: Not on file    PSH:  Past Surgical History:  Procedure Laterality Date   ABDOMINAL HYSTERECTOMY     carbuncle removal     urethra   CATARACT EXTRACTION W/PHACO Left 10/29/2020   Procedure: CATARACT EXTRACTION PHACO AND INTRAOCULAR LENS PLACEMENT (IOC) LEFT DIABETIC 1051 01:00.0 ;  Surgeon: Eulogio Bear,  MD;  Location: [place];  Service: Ophthalmology;  Laterality: Left;  Diabetic - insulin   CATARACT EXTRACTION W/PHACO Right 11/19/2020   Procedure: CATARACT EXTRACTION PHACO AND INTRAOCULAR LENS PLACEMENT (IOC) RIGHT DIABETIC;  Surgeon: Eulogio Bear, MD;  Location: [place];  Service: Ophthalmology;  Laterality: Right;  16.54 1:40.0   COLONOSCOPY     COLONOSCOPY WITH PROPOFOL N/A 11/05/2017   Procedure: COLONOSCOPY WITH PROPOFOL;  Surgeon: Lollie Sails, MD;  Location: [facility] ENDOSCOPY;  Service: Endoscopy;  Laterality: N/A;   ESOPHAGOGASTRODUODENOSCOPY     ESOPHAGOGASTRODUODENOSCOPY (EGD) WITH PROPOFOL N/A 11/05/2017   Procedure: ESOPHAGOGASTRODUODENOSCOPY (EGD) WITH PROPOFOL;  Surgeon: Lollie Sails, MD;  Location: [facility] ENDOSCOPY;   Service: Endoscopy;  Laterality: N/A;   IR IMAGING GUIDED PORT INSERTION  12/13/2020   IR IMAGING GUIDED PORT INSERTION  01/25/2021   VIDEO BRONCHOSCOPY WITH ENDOBRONCHIAL NAVIGATION N/A 12/02/2019   Procedure: VIDEO BRONCHOSCOPY WITH ENDOBRONCHIAL NAVIGATION;  Surgeon: Donna Glazier, MD;  Location: [facility];  Service: Thoracic;  Laterality: N/A;   VIDEO BRONCHOSCOPY WITH ENDOBRONCHIAL ULTRASOUND N/A 12/02/2019   Procedure: VIDEO BRONCHOSCOPY WITH ENDOBRONCHIAL ULTRASOUND;  Surgeon: Donna Glazier, MD;  Location: [facility] ORS;  Service: Thoracic;  Laterality: N/A;    Physical  Exam: Patient is intubated and sedated, anterior nose appears clear the anterior neck shows no previous scars identified, she is not obese.  The external ears appear normal.   A/P: This patient was admitted for COVID-pneumonia exacerbated by metastatic lung cancer.  The question was whether an early tracheostomy would allow the patient to be awake and more quickly be weaned from the ventilator.  I had an extensive discussion with Donna Brennan, her daughter, on the phone regarding [name].  Because the patient was just intubated 2 days ago and the fact that she is actively infected with COVID, I do not think that we need to rush into tracheostomy at this point.  I have told Donna Brennan that it would be far better if we can let her mom's lungs continue to heal eventually be able to extubate her if possible.  I have also explained to her that a tracheostomy is a fairly invasive procedure, and there is a real risk that once the tracheostomy tube is placed that we may not be able to remove it anytime soon.  This would require extensive home care and if she does require rehab placement is often difficult with a tracheostomy tube in position.  Donna Brennan understands and agrees that we will continue to follow along and see how her lungs progress.  I have encouraged her to speak with her sister and for them to write down any questions that they  may have regarding the tracheostomy tube placement and to give my office a call and I am happy to answer any of those questions.  I am familiar with her family as I have operated on Donna Brennan's husband in the past.  I will continue to follow Donna Brennan and we will see how she progresses in the next week to 10 days then make a final decision about possible tracheostomy tube placement at that time.  Donna Brennan was in agreement with this plan.   ICU time approximately 45 minutes   Roena Malady 03/22/2021 6:08 PM

## 2021-03-22 NOTE — Progress Notes (Signed)
Inpatient Diabetes Program Recommendations  AACE/ADA: New Consensus Statement on Inpatient Glycemic Control (2015)  Target Ranges:  Prepandial:   less than 140 mg/dL      Peak postprandial:   less than 180 mg/dL (1-2 hours)      Critically ill patients:  140 - 180 mg/dL   Results for Donna Brennan, Donna Brennan (MRN [id number]) as of 03/22/2021 12:04  Ref. Range 03/22/2021 00:06 03/22/2021 04:34 03/22/2021 07:26 03/22/2021 11:39  Glucose-Capillary Latest Ref Range: 70 - 99 mg/dL 138 (H) 219 (H) 191 (H) 222 (H)    Home DM Meds: Lantus 12 units QAM        Lantus 20 units QHS  Current Orders: Novolog 0-15 units Q4H    Solumedrol reduced to 40 mg Daily  Stopped Levemir 6/14.   MD- Note CBGs rising today.  Please consider adding back portion of home dose Lantus if this trend continues  Lantus 10 units Daily (1/3 total home dose to start)   --Will follow patient during hospitalization--  Wyn Quaker RN, MSN, CDE Diabetes Coordinator Inpatient Glycemic Control Team Team Pager: [phone] (8a-5p)

## 2021-03-22 NOTE — Consult Note (Signed)
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 Pharmacy consulted to assist with electrolyte monitoring and replacement as indicated.  Patient is currently intubated, sedated, and on MV in the ICU. Weaning vasopressors.  Goal of Therapy:  Electrolytes within normal limits  Plan:  --No electrolyte replacement warranted at this time --Follow-up electrolytes in AM  Benita Gutter 03/22/2021 7:54 AM

## 2021-03-23 LAB — BASIC METABOLIC PANEL
Anion gap: 9 (ref 5–15)
BUN: 68 mg/dL — ABNORMAL HIGH (ref 8–23)
CO2: 19 mmol/L — ABNORMAL LOW (ref 22–32)
Calcium: 7.6 mg/dL — ABNORMAL LOW (ref 8.9–10.3)
Chloride: 112 mmol/L — ABNORMAL HIGH (ref 98–111)
Creatinine, Ser: 1.11 mg/dL — ABNORMAL HIGH (ref 0.44–1.00)
GFR, Estimated: 52 mL/min — ABNORMAL LOW (ref >60)
Glucose, Bld: 235 mg/dL — ABNORMAL HIGH (ref 70–99)
Potassium: 4.8 mmol/L (ref 3.5–5.1)
Sodium: 140 mmol/L (ref 135–145)

## 2021-03-23 LAB — ECHOCARDIOGRAM COMPLETE
AR max vel: 1.86 cm2
AV Area VTI: 1.66 cm2
AV Area mean vel: 1.48 cm2
AV Mean grad: 5 mmHg
AV Peak grad: 8 mmHg
Ao pk vel: 1.41 m/s
Area-P 1/2: 4.74 cm2
Height: 61.5 in
MV VTI: 1.54 cm2
S' Lateral: 2.6 cm
Weight: 2000 oz

## 2021-03-23 LAB — PHOSPHORUS: Phosphorus: 3.9 mg/dL (ref 2.5–4.6)

## 2021-03-23 LAB — TRIGLYCERIDES: Triglycerides: 269 mg/dL — ABNORMAL HIGH (ref <150)

## 2021-03-23 LAB — GLUCOSE, CAPILLARY
Glucose-Capillary: 190 mg/dL — ABNORMAL HIGH (ref 70–99)
Glucose-Capillary: 206 mg/dL — ABNORMAL HIGH (ref 70–99)
Glucose-Capillary: 207 mg/dL — ABNORMAL HIGH (ref 70–99)
Glucose-Capillary: 220 mg/dL — ABNORMAL HIGH (ref 70–99)
Glucose-Capillary: 224 mg/dL — ABNORMAL HIGH (ref 70–99)
Glucose-Capillary: 228 mg/dL — ABNORMAL HIGH (ref 70–99)
Glucose-Capillary: 243 mg/dL — ABNORMAL HIGH (ref 70–99)

## 2021-03-23 LAB — MAGNESIUM: Magnesium: 1.9 mg/dL (ref 1.7–2.4)

## 2021-03-23 MED ORDER — INSULIN ASPART 100 UNIT/ML IJ SOLN
0.0000 [IU] | INTRAMUSCULAR | Status: DC
  Administered 2021-03-23: 7 [IU] via SUBCUTANEOUS
  Administered 2021-03-23: 11 [IU] via SUBCUTANEOUS
  Administered 2021-03-24 (×2): 4 [IU] via SUBCUTANEOUS
  Administered 2021-03-24: 3 [IU] via SUBCUTANEOUS
  Administered 2021-03-24: 7 [IU] via SUBCUTANEOUS
  Administered 2021-03-24: 3 [IU] via SUBCUTANEOUS
  Administered 2021-03-25 (×3): 7 [IU] via SUBCUTANEOUS
  Administered 2021-03-25: 4 [IU] via SUBCUTANEOUS
  Filled 2021-03-23 (×11): qty 1

## 2021-03-23 MED ORDER — LACTATED RINGERS IV BOLUS
1000.0000 mL | Freq: Once | INTRAVENOUS | Status: AC
  Administered 2021-03-23: 1000 mL via INTRAVENOUS

## 2021-03-23 MED ORDER — VECURONIUM BROMIDE 10 MG IV SOLR
10.0000 mg | INTRAVENOUS | Status: DC | PRN
  Administered 2021-03-24 – 2021-03-25 (×4): 10 mg via INTRAVENOUS
  Filled 2021-03-23 (×5): qty 10

## 2021-03-23 MED ORDER — VECURONIUM BROMIDE 10 MG IV SOLR
INTRAVENOUS | Status: AC
  Administered 2021-03-23: 10 mg via INTRAVENOUS
  Filled 2021-03-23: qty 10

## 2021-03-23 NOTE — Progress Notes (Signed)
Patient O2 sats noted at 89% on the monitor. Her vent then started to alarm. Patient was observed having pulled out her ETT tube and her NG tube. She was gasping for air, agitated and anxious. Provider was making rounds at this time and notified of the situation. O2 sat became unavailable as the patient removed her O2 sensor as well. This Probation officer and the charge nurse entered the room and began bagging the patient and then she was put on a non-rebreather as her O2 sensor was replaced and she was satting in the 80s. Patient was successfully re-intubated and OG tube replaced. Line placement was verified by x-ray. Patient is currently sedated with fentanyl and propofol. No acute distress noted at this time. Will continue to monitor.

## 2021-03-23 NOTE — Progress Notes (Signed)
NAME:  Donna Brennan, MRN:  [id number], DOB:  1945/10/31, LOS: 7 ADMISSION DATE:  03/28/2021, CONSULTATION DATE: 03/20/2021 REFERRING MD: Dr. 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 cholangio-/pancreaticobiliary).  She presented to [facility] ER from home on 06/11 with c/o weakness, productive cough, and shortness of breath.  The morning of 06/11 she tested positive for COVID via home test, she also tested positive for COVID on 06/6.  Pts husband COVID positive.  She has received 2 doses of COVID-19 vaccine, however she has not had a booster dose.     Upon arrival to the ER O2 sats were 85% on RA and pt noted to have increased work of breathing, therefore she was placed on 2L O2 via nasal canula.  CXR revealed chronic patchy density over the left mid to lower lung concerning for atelectasis vs. early superimposed infection.  CTA Chest negative for pulmonary embolism but concerning for COVID-19 pneumonia and chronic infection or aspiration.  She received iv solumedrol, empiric iv abx (rocephin and azithromycin), and remdesivir per protocol.  She was subsequently admitted to the [facility] unit per hospitalist team for additional workup and treatment.    Hospital course complicated by worsening acute hypoxic respiratory failure requiring pt transfer to ICU for emergent intubation on 06/15.   03/23/21- patient had self extubation overnight and was emergently re-intubated.  We have reached out to family for meeting and we have asked ENT for consultation as per family request.   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 cholangio-/pancreaticobiliary) COPD Stage IV CKD Colon Polyps  Type II Diabetes Mellitus GERD Headache  Hepatitis  HLD IBS Tobacco Abuse Abuse   Vertigo   Objective   Blood pressure (!) 143/123, pulse 95, temperature (!) 96.5 F (35.8 C), temperature source Axillary, resp. rate (!) 22, height 5' 1.5" (1.562 m), weight 56.7 kg, SpO2 91 %.    Vent Mode: PRVC FiO2 (%):  [35 %-60 %] 60 % Set Rate:  [25 bmp] 25 bmp Vt Set:  [450 mL] 450 mL PEEP:  [5 cmH20-10 cmH20] 5 cmH20 Plateau Pressure:  [27 cmH20] 27 cmH20   Intake/Output Summary (Last 24 hours) at 03/23/2021 0936 Last data filed at 03/23/2021 0800 Gross per 24 hour  Intake 814.84 ml  Output 825 ml  Net -10.16 ml    Filed Weights   03/07/2021 1211  Weight: 56.7 kg    Examination: General: acutely ill appearing female, NAD mechanically intubated  HENT: supple, JVD present  Lungs: rhonchi throughout, even, non labored, cyanotic, mechanically intubated  Cardiovascular: sinus tach, no R/G, 2+ radial/1+ distal pulses, no edema  Abdomen: +BS x4, soft, non distended Extremities: normal bulk and tone, cyanotic  Neuro: sedated, not following commands, PERRL GU: purewick in place   Labs/imaging that I havepersonally reviewed  (right click and "Reselect all SmartList Selections" daily)  06/11: CTA Chest revealed no evidence of pulmonary embolism. Development of multifocal peripheral predominant ground-glass opacities, highly suspicious for COVID-19 pneumonia. Left lower lobe pulmonary opacities are slightly progressive and likely represent a combination of COVID-19 pneumonia and chronic infection or aspiration. Similar appearance of probable radiation induced consolidation within the lingula and anterior left lower lobe. Anterior left lower lobe nodularity is similar. Subcarinal adenopathy, mild and new since 02/18/2021. Most likely reactive. Metastatic disease could  look similar. Recommend attention on follow-up. Coronary artery atherosclerosis. Aortic Atherosclerosis (ICD10-I70.0). Right hepatic lobe metastasis, suboptimally evaluated secondary to bolus timing. 06/15: ABG~pH  7.42/pCO2 33/pO2 76/acid-base deficit 2.4; d-dimer 9.06 06/15: CXR~worsening airspace disease and/or edema   Resolved Hospital Problem list   N/A  Assessment & Plan:  Acute on chronic hypoxic respiratory failure secondary COVID-19 pneumonia with suspected bacterial infection vs. pulmonary edema further complicated by COPD Mechanical ventilation via ARDS protocol, target PRVC 6 cc/kg Wean PEEP and FiO2 as able to maintain O2 sats >88% Goal plateau pressure less than 30, driving pressure less than 15 Paralytics if necessary for vent synchrony, gas exchange Cycle prone positioning if necessary for oxygenation Deep sedation per PAD protocol, goal RASS -3, currently fentanyl and propofol  Diuresis as blood pressure and renal function can tolerate, goal CVP 5-8.  BNP pending  VAP prevention order set Remdesivir, plan for 5 days Steroids Follow inflammatory markers: Ferritin, D-dimer, CRP, IL-6, LDH Vitamin C, zinc Will check resp cultures  Will check pct if elevated will started cefepime  Sinus tachycardia  Continuous telemetry monitoring Will check troponin   Hypokalemia  Hypomagnesia Trend BMP  Replace electrolytes as indicated  Monitor UOP Avoid nephrotoxic medications   Pancytopenia likely secondary to stage IV poorly differentiated adenocarcinoma with unclear primary (suspected to be lung vs. hepatobiliary) Trend CBC Monitor for s/sx of bleeding and transfuse for hgb <7  Type II diabetes mellitus  CBG q4hrs SSI   GERD Scheduled IV protonix   Best practice (right click and "Reselect all SmartList Selections" daily)  Diet:  NPO Pain/Anxiety/Delirium protocol (if indicated): Yes (RASS goal -3) VAP protocol (if indicated): Yes DVT prophylaxis: LMWH GI prophylaxis: PPI Glucose control:  SSI Yes Central venous access: Right chest portacath  Arterial line:  N/A Foley:  N/A Mobility:  bed rest  PT consulted: N/A Last date of multidisciplinary goals of care discussion  [N/A] Code Status:  full code Disposition: ICU  Labs   CBC: Recent Labs  Lab 03/17/21 0648 03/18/21 0521 03/19/21 0434 03/20/21 0457 03/21/21 0614  WBC 3.7* 3.0* 3.2* 8.1 5.9  NEUTROABS 3.1 2.3 2.8 7.4 5.2  HGB 7.6* 7.6* 7.4* 11.6* 9.4*  HCT 23.2* 23.0* 21.7* 33.8* 27.1*  MCV 103.1* 101.3* 98.6 95.2 95.8  PLT 127* 142* 132* 152 108*     Basic Metabolic Panel: Recent Labs  Lab 03/19/21 0434 03/20/21 0457 03/20/21 1413 03/21/21 0614 03/22/21 0504 03/23/21 0604  NA 141 142  --  138 138 140  K 3.2* 2.9* 4.6 4.6 4.5 4.8  CL 112* 107  --  111 113* 112*  CO2 23 21*  --  22 19* 19*  GLUCOSE 265* 138*  --  223* 213* 235*  BUN 29* 25*  --  47* 62* 68*  CREATININE 1.03* 0.93  --  1.20* 1.13* 1.11*  CALCIUM 7.9* 7.7*  --  7.4* 7.7* 7.6*  MG 1.7 1.7 1.6* 2.0  --  1.9  PHOS 2.1* 4.8* 3.5 3.6  --  3.9    GFR: Estimated Creatinine Clearance: 33.3 mL/min (A) (by C-G formula based on SCr of 1.11 mg/dL (H)). Recent Labs  Lab 03/07/2021 1225 03/15/2021 1226 03/08/2021 1408 03/17/21 3762 03/18/21 0521 03/19/21 0434 03/20/21 0457 03/20/21 0652 03/21/21 0614 03/21/21 1240  PROCALCITON  --  0.56  --   --  0.49  --   --  0.69  --  0.51  WBC 12.8*  --   --    < > 3.0* 3.2*  8.1  --  5.9  --   LATICACIDVEN 4.8*  --  1.4  --   --   --   --   --   --   --    < > = values in this interval not displayed.     Liver Function Tests: Recent Labs  Lab 03/18/21 0521 03/19/21 0434 03/20/21 0457 03/21/21 0614 03/22/21 0504  AST 26 42* 184* 92* 81*  ALT 14 22 74* 67* 65*  ALKPHOS 67 142* 248* 196* 205*  BILITOT 0.4 0.4 0.3 0.4 0.5  PROT 5.9* 6.0* 6.7 5.6* 5.7*  ALBUMIN 1.6* 1.6* 1.8* 1.5* 1.8*    No results for input(s): LIPASE, AMYLASE in the last 168 hours. No results for input(s): AMMONIA in the last 168 hours.  ABG    Component Value Date/Time   PHART 7.41 03/22/2021 2100   PCO2ART 29 (L) 03/22/2021 2100   PO2ART 70 (L) 03/22/2021 2100   HCO3 18.4 (L) 03/22/2021 2100    ACIDBASEDEF 5.3 (H) 03/22/2021 2100   O2SAT 94.0 03/22/2021 2100      Coagulation Profile: Recent Labs  Lab 03/22/2021 1225 03/17/21 0600  INR 1.2 1.4*     Cardiac Enzymes: No results for input(s): CKTOTAL, CKMB, CKMBINDEX, TROPONINI in the last 168 hours.  HbA1C: Hgb A1c MFr Bld  Date/Time Value Ref Range Status  03/19/2021 04:34 AM 7.5 (H) 4.8 - 5.6 % Final    Comment:    (NOTE)         Prediabetes: 5.7 - 6.4         Diabetes: >6.4         Glycemic control for adults with diabetes: <7.0     CBG: Recent Labs  Lab 03/22/21 1552 03/22/21 2011 03/22/21 2249 03/23/21 0353 03/23/21 0740  GLUCAP 144* 153* 129* 206* 243*     Review of Systems:   Pt mechanically intubated   Past Medical History:  She,  has a past medical history of CKD (chronic kidney disease), stage IV ([place]), Colon polyps, COPD (chronic obstructive pulmonary disease) ([place]), Diabetes mellitus without complication ([place]), GERD (gastroesophageal reflux disease), Headache, Hepatitis, Hyperlipidemia, IBS (irritable bowel syndrome), Peptic ulcer, Tobacco abuse, Vertigo, and Wears dentures.   Surgical History:   Past Surgical History:  Procedure Laterality Date   ABDOMINAL HYSTERECTOMY     carbuncle removal     urethra   CATARACT EXTRACTION W/PHACO Left 10/29/2020   Procedure: CATARACT EXTRACTION PHACO AND INTRAOCULAR LENS PLACEMENT (IOC) LEFT DIABETIC 1051 01:00.0 ;  Surgeon: Eulogio Bear, MD;  Location: [place];  Service: Ophthalmology;  Laterality: Left;  Diabetic - insulin   CATARACT EXTRACTION W/PHACO Right 11/19/2020   Procedure: CATARACT EXTRACTION PHACO AND INTRAOCULAR LENS PLACEMENT (IOC) RIGHT DIABETIC;  Surgeon: Eulogio Bear, MD;  Location: [place];  Service: Ophthalmology;  Laterality: Right;  16.54 1:40.0   COLONOSCOPY     COLONOSCOPY WITH PROPOFOL N/A 11/05/2017   Procedure: COLONOSCOPY WITH PROPOFOL;  Surgeon: Lollie Sails, MD;  Location: [facility]  ENDOSCOPY;  Service: Endoscopy;  Laterality: N/A;   ESOPHAGOGASTRODUODENOSCOPY     ESOPHAGOGASTRODUODENOSCOPY (EGD) WITH PROPOFOL N/A 11/05/2017   Procedure: ESOPHAGOGASTRODUODENOSCOPY (EGD) WITH PROPOFOL;  Surgeon: Lollie Sails, MD;  Location[facility] ENDOSCOPY;  Service: Endoscopy;  Laterality: N/A;   IR IMAGING GUIDED PORT INSERTION  12/13/2020   IR IMAGING GUIDED PORT INSERTION  01/25/2021   VIDEO BRONCHOSCOPY WITH ENDOBRONCHIAL NAVIGATION N/A 12/02/2019   Procedure: VIDEO BRONCHOSCOPY WITH ENDOBRONCHIAL NAVIGATION;  Surgeon:  Ottie Glazier, MD;  Location: [facility]S;  Service: Thoracic;  Laterality: N/A;   VIDEO BRONCHOSCOPY WITH ENDOBRONCHIAL ULTRASOUND N/A 12/02/2019   Procedure: VIDEO BRONCHOSCOPY WITH ENDOBRONCHIAL ULTRASOUND;  Surgeon: Ottie Glazier, MD;  Location: [facility] ORS;  Service: Thoracic;  Laterality: N/A;     Social History:   reports that she has been smoking cigarettes. She has a 11.25 pack-year smoking history. She has never used smokeless tobacco. She reports that she does not drink alcohol and does not use drugs.   Family History:  Her family history is negative for Breast cancer.   Allergies Allergies  Allergen Reactions   Penicillins Anaphylaxis and Rash    Did it involve swelling of the face/tongue/throat, SOB, or low BP? Yes Did it involve sudden or severe rash/hives, skin peeling, or any reaction on the inside of your mouth or nose? Yes Did you need to seek medical attention at a hospital or doctor's office? Yes When did it last happen?More than 40 years ago If all above answers are "NO", may proceed with cephalosporin use.    Metformin Other (See Comments)    Upset stomach   Sulfa Antibiotics Nausea And Vomiting     Home Medications  Prior to Admission medications   Medication Sig Start Date End Date Taking? Authorizing Provider  acetaminophen (TYLENOL) 500 MG tablet Take 1,000 mg by mouth every 6 (six) hours as needed for mild pain or moderate pain.    Yes [provider]  albuterol (VENTOLIN HFA) 108 (90 Base) MCG/ACT inhaler Inhale 1-2 puffs into the lungs every 6 (six) hours as needed for wheezing or shortness of breath. 02/10/20  Yes Cammie Sickle, MD  chlorpheniramine-HYDROcodone (TUSSIONEX) 10-8 MG/5ML SUER Take 5 mLs by mouth at bedtime as needed for cough. Patient taking differently: Take 5 mLs by mouth 3 (three) times daily as needed for cough. 02/08/21  Yes Cammie Sickle, MD  diazepam (VALIUM) 5 MG tablet Take 5 mg by mouth daily as needed (for vertigo).   Yes [provider]  ezetimibe (ZETIA) 10 MG tablet Take 10 mg by mouth daily.    Yes [provider]  Fluticasone-Salmeterol (ADVAIR) 250-50 MCG/DOSE AEPB Inhale 1 puff into the lungs 2 (two) times daily.   Yes [provider]  folic acid (FOLVITE) 1 MG tablet Take 1 tablet (1 mg total) by mouth daily. 12/14/20  Yes Cammie Sickle, MD  hyoscyamine (LEVBID) 0.375 MG 12 hr tablet Take 0.375 mg by mouth daily as needed (abdominal cramping).  03/24/19  Yes [provider]  Ipratropium-Albuterol (COMBIVENT) 20-100 MCG/ACT AERS respimat Inhale 1 puff into the lungs every 4 (four) hours as needed for wheezing.   Yes [provider]  ipratropium-albuterol (DUONEB) 0.5-2.5 (3) MG/3ML SOLN Take 3 mLs by nebulization every 6 (six) hours as needed (wheezing). 12/28/20  Yes Verlon Au, NP  LANTUS SOLOSTAR 100 UNIT/ML Solostar Pen Inject 12-20 Units into the skin See admin instructions. Inject 12u under the skin every morning and inject 20u under the skin at bedtime   Yes [provider]  lidocaine-prilocaine (EMLA) cream Apply 1 application topically as needed. 12/05/20  Yes Cammie Sickle, MD  megestrol (MEGACE ES) 625 MG/5ML suspension Take 5 mLs (625 mg total) by mouth daily. 02/25/21  Yes Cammie Sickle, MD  Multiple Vitamin (MULTIVITAMIN WITH MINERALS) TABS tablet Take 1 tablet by mouth daily.  One-A-Day for Women   Yes [provider]  nystatin (MYCOSTATIN) 100000 UNIT/ML suspension Take 5  mLs by mouth 2 (two) times daily as needed (throat irritation.).   Yes [provider]  omeprazole (PRILOSEC) 20 MG capsule Take 20 mg by mouth daily before breakfast.    Yes [provider]  ondansetron (ZOFRAN) 8 MG tablet Take 1 tablet (8 mg total) by mouth every 8 (eight) hours as needed for nausea or vomiting. 12/05/20  Yes Cammie Sickle, MD  prochlorperazine (COMPAZINE) 10 MG tablet Take 1 tablet (10 mg total) by mouth every 6 (six) hours as needed for nausea or vomiting. 12/05/20  Yes Cammie Sickle, MD  rosuvastatin (CRESTOR) 40 MG tablet Take 40 mg by mouth every evening.    Yes [provider]  vitamin B-12 (CYANOCOBALAMIN) 1000 MCG tablet Take 1,000 mcg by mouth daily.   Yes [provider]     Critical care provider statement:    Critical care time (minutes):  33   Critical care time was exclusive of:  Separately billable procedures and  treating other patients   Critical care was necessary to treat or prevent imminent or  life-threatening deterioration of the following conditions:  COVID19 pneumonia, lung cancer , bilateral pleural effusions, metastatic cancer, chronic severe anemia requiring blood transfusion.    Critical care was time spent personally by me on the following  activities:  Development of treatment plan with patient or surrogate,  discussions with consultants, evaluation of patient's response to  treatment, examination of patient, obtaining history from patient or  surrogate, ordering and performing treatments and interventions, ordering  and review of laboratory studies and re-evaluation of patient's condition   I assumed direction of critical care for this patient from another  provider in my specialty: no          Ottie Glazier, M.D.  P[facility] [place]

## 2021-03-23 NOTE — Plan of Care (Signed)
  Problem: Nutrition: Goal: Adequate nutrition will be maintained Outcome: Progressing   Problem: Safety: Goal: Ability to remain free from injury will improve Outcome: Progressing   Problem: Coping: Goal: Level of anxiety will decrease Outcome: Not Progressing

## 2021-03-23 NOTE — Consult Note (Signed)
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 Pharmacy consulted to assist with electrolyte monitoring and replacement as indicated.  Patient is currently intubated, sedated, and on MV in the ICU. Weaning vasopressors.  Goal of Therapy:  Electrolytes within normal limits  Plan:  --No electrolyte replacement warranted at this time --Follow-up electrolytes in AM  Donna Brennan A 03/23/2021 11:08 AM

## 2021-03-24 ENCOUNTER — Inpatient Hospital Stay: Payer: Medicare Other

## 2021-03-24 LAB — COMPREHENSIVE METABOLIC PANEL
ALT: 49 U/L — ABNORMAL HIGH (ref 0–44)
AST: 75 U/L — ABNORMAL HIGH (ref 15–41)
Albumin: 1.5 g/dL — ABNORMAL LOW (ref 3.5–5.0)
Alkaline Phosphatase: 180 U/L — ABNORMAL HIGH (ref 38–126)
Anion gap: 5 (ref 5–15)
BUN: 78 mg/dL — ABNORMAL HIGH (ref 8–23)
CO2: 23 mmol/L (ref 22–32)
Calcium: 7.6 mg/dL — ABNORMAL LOW (ref 8.9–10.3)
Chloride: 113 mmol/L — ABNORMAL HIGH (ref 98–111)
Creatinine, Ser: 1.34 mg/dL — ABNORMAL HIGH (ref 0.44–1.00)
GFR, Estimated: 41 mL/min — ABNORMAL LOW (ref >60)
Glucose, Bld: 157 mg/dL — ABNORMAL HIGH (ref 70–99)
Potassium: 4.8 mmol/L (ref 3.5–5.1)
Sodium: 141 mmol/L (ref 135–145)
Total Bilirubin: 0.7 mg/dL (ref 0.3–1.2)
Total Protein: 5.7 g/dL — ABNORMAL LOW (ref 6.5–8.1)

## 2021-03-24 LAB — CBC WITH DIFFERENTIAL/PLATELET
Abs Immature Granulocytes: 0.17 10*3/uL — ABNORMAL HIGH (ref 0.00–0.07)
Basophils Absolute: 0.1 10*3/uL (ref 0.0–0.1)
Basophils Relative: 1 %
Eosinophils Absolute: 0 10*3/uL (ref 0.0–0.5)
Eosinophils Relative: 0 %
HCT: 27.3 % — ABNORMAL LOW (ref 36.0–46.0)
Hemoglobin: 8.8 g/dL — ABNORMAL LOW (ref 12.0–15.0)
Immature Granulocytes: 2 %
Lymphocytes Relative: 5 %
Lymphs Abs: 0.5 10*3/uL — ABNORMAL LOW (ref 0.7–4.0)
MCH: 32.8 pg (ref 26.0–34.0)
MCHC: 32.2 g/dL (ref 30.0–36.0)
MCV: 101.9 fL — ABNORMAL HIGH (ref 80.0–100.0)
Monocytes Absolute: 0.4 10*3/uL (ref 0.1–1.0)
Monocytes Relative: 3 %
Neutro Abs: 9.8 10*3/uL — ABNORMAL HIGH (ref 1.7–7.7)
Neutrophils Relative %: 89 %
Platelets: UNDETERMINED 10*3/uL (ref 150–400)
RBC: 2.68 MIL/uL — ABNORMAL LOW (ref 3.87–5.11)
RDW: 19.5 % — ABNORMAL HIGH (ref 11.5–15.5)
WBC: 10.9 10*3/uL — ABNORMAL HIGH (ref 4.0–10.5)
nRBC: 0.3 % — ABNORMAL HIGH (ref 0.0–0.2)

## 2021-03-24 LAB — HEMOGLOBIN AND HEMATOCRIT, BLOOD
HCT: 30.2 % — ABNORMAL LOW (ref 36.0–46.0)
Hemoglobin: 9.5 g/dL — ABNORMAL LOW (ref 12.0–15.0)

## 2021-03-24 LAB — GLUCOSE, CAPILLARY
Glucose-Capillary: 132 mg/dL — ABNORMAL HIGH (ref 70–99)
Glucose-Capillary: 138 mg/dL — ABNORMAL HIGH (ref 70–99)
Glucose-Capillary: 171 mg/dL — ABNORMAL HIGH (ref 70–99)
Glucose-Capillary: 193 mg/dL — ABNORMAL HIGH (ref 70–99)
Glucose-Capillary: 233 mg/dL — ABNORMAL HIGH (ref 70–99)
Glucose-Capillary: 244 mg/dL — ABNORMAL HIGH (ref 70–99)

## 2021-03-24 LAB — C-REACTIVE PROTEIN: CRP: 19.7 mg/dL — ABNORMAL HIGH (ref <1.0)

## 2021-03-24 LAB — PROTIME-INR
INR: 1.5 — ABNORMAL HIGH (ref 0.8–1.2)
Prothrombin Time: 17.8 seconds — ABNORMAL HIGH (ref 11.4–15.2)

## 2021-03-24 LAB — PHOSPHORUS: Phosphorus: 4.1 mg/dL (ref 2.5–4.6)

## 2021-03-24 LAB — MAGNESIUM: Magnesium: 2.1 mg/dL (ref 1.7–2.4)

## 2021-03-24 MED ORDER — FUROSEMIDE 10 MG/ML IJ SOLN
INTRAMUSCULAR | Status: AC
  Filled 2021-03-24: qty 4

## 2021-03-24 MED ORDER — INSULIN GLARGINE 100 UNIT/ML ~~LOC~~ SOLN
10.0000 [IU] | Freq: Every day | SUBCUTANEOUS | Status: DC
  Administered 2021-03-24 – 2021-03-25 (×2): 10 [IU] via SUBCUTANEOUS
  Filled 2021-03-24 (×3): qty 0.1

## 2021-03-24 MED ORDER — ORAL CARE MOUTH RINSE
15.0000 mL | OROMUCOSAL | Status: DC
  Administered 2021-03-24 – 2021-03-25 (×12): 15 mL via OROMUCOSAL

## 2021-03-24 MED ORDER — NOREPINEPHRINE 4 MG/250ML-% IV SOLN
0.0000 ug/min | INTRAVENOUS | Status: DC
  Administered 2021-03-24: 2 ug/min via INTRAVENOUS
  Administered 2021-03-25: 10 ug/min via INTRAVENOUS
  Filled 2021-03-24: qty 250

## 2021-03-24 MED ORDER — ALBUMIN HUMAN 25 % IV SOLN
25.0000 g | Freq: Four times a day (QID) | INTRAVENOUS | Status: DC
Start: 2021-03-24 — End: 2021-03-25
  Administered 2021-03-24 – 2021-03-25 (×3): 25 g via INTRAVENOUS
  Filled 2021-03-24 (×3): qty 100

## 2021-03-24 MED ORDER — NOREPINEPHRINE 4 MG/250ML-% IV SOLN
INTRAVENOUS | Status: AC
  Filled 2021-03-24: qty 250

## 2021-03-24 MED ORDER — CHLORHEXIDINE GLUCONATE 0.12% ORAL RINSE (MEDLINE KIT)
15.0000 mL | Freq: Two times a day (BID) | OROMUCOSAL | Status: DC
  Administered 2021-03-24 – 2021-03-25 (×3): 15 mL via OROMUCOSAL

## 2021-03-24 MED ORDER — METHYLPREDNISOLONE SODIUM SUCC 40 MG IJ SOLR: 20.0000 mg | Freq: Every day | INTRAMUSCULAR | Status: DC

## 2021-03-24 MED ORDER — ENOXAPARIN SODIUM 30 MG/0.3ML IJ SOSY
30.0000 mg | PREFILLED_SYRINGE | INTRAMUSCULAR | Status: DC
  Administered 2021-03-24: 30 mg via SUBCUTANEOUS
  Filled 2021-03-24: qty 0.3

## 2021-03-24 MED ORDER — FUROSEMIDE 10 MG/ML IJ SOLN
40.0000 mg | Freq: Once | INTRAMUSCULAR | Status: AC
  Administered 2021-03-24: 40 mg via INTRAVENOUS

## 2021-03-24 NOTE — Progress Notes (Signed)
NAME:  Donna Brennan, MRN:  [id number], DOB:  1945/12/30, LOS: 8 ADMISSION DATE:  03/10/2021, CONSULTATION DATE: 03/20/2021 REFERRING MD: Dr. 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 cholangio-/pancreaticobiliary).  She presented to [facility] ER from home on 06/11 with c/o weakness, productive cough, and shortness of breath.  The morning of 06/11 she tested positive for COVID via home test, she also tested positive for COVID on 06/6.  Pts husband COVID positive.  She has received 2 doses of COVID-19 vaccine, however she has not had a booster dose.     Upon arrival to the ER O2 sats were 85% on RA and pt noted to have increased work of breathing, therefore she was placed on 2L O2 via nasal canula.  CXR revealed chronic patchy density over the left mid to lower lung concerning for atelectasis vs. early superimposed infection.  CTA Chest negative for pulmonary embolism but concerning for COVID-19 pneumonia and chronic infection or aspiration.  She received iv solumedrol, empiric iv abx (rocephin and azithromycin), and remdesivir per protocol.  She was subsequently admitted to the [facility] unit per hospitalist team for additional workup and treatment.    Hospital course complicated by worsening acute hypoxic respiratory failure requiring pt transfer to ICU for emergent intubation on 06/15.   03/23/21- patient had self extubation overnight and was emergently re-intubated.  We have reached out to family for meeting and we have asked ENT for consultation as per family request.   03/24/21- Patient is relatively unchanged today on PRVC.   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 cholangio-/pancreaticobiliary) COPD Stage IV CKD Colon Polyps  Type II Diabetes  Mellitus GERD Headache  Hepatitis  HLD IBS Tobacco Abuse Abuse  Vertigo   Objective   Blood pressure (!) 104/46, pulse 87, temperature (!) 100.58 F (38.1 C), temperature source Esophageal, resp. rate (!) 25, height 5' 1.5" (1.562 m), weight 56.7 kg, SpO2 (!) 89 %.    Vent Mode: PRVC FiO2 (%):  [50 %-65 %] 65 % Set Rate:  [25 bmp] 25 bmp Vt Set:  [450 mL] 450 mL PEEP:  [5 cmH20-10 cmH20] 10 cmH20 Plateau Pressure:  [35 cmH20] 35 cmH20   Intake/Output Summary (Last 24 hours) at 03/24/2021 1227 Last data filed at 03/24/2021 1200 Gross per 24 hour  Intake 2980.58 ml  Output 1250 ml  Net 1730.58 ml    Filed Weights   03/11/2021 1211  Weight: 56.7 kg    Examination: General: acutely ill appearing female, NAD mechanically intubated  HENT: supple, JVD present  Lungs: rhonchi throughout, even, non labored, cyanotic, mechanically intubated  Cardiovascular: sinus tach, no R/G, 2+ radial/1+ distal pulses, no edema  Abdomen: +BS x4, soft, non distended Extremities: normal bulk and tone, cyanotic  Neuro: sedated, not following commands, PERRL GU: purewick in place   Labs/imaging that I havepersonally reviewed  (right click and "Reselect all SmartList Selections" daily)  06/11: CTA Chest revealed no evidence of pulmonary embolism. Development of multifocal peripheral predominant ground-glass opacities, highly suspicious for COVID-19 pneumonia. Left lower lobe pulmonary opacities are slightly progressive and likely represent a combination of COVID-19 pneumonia and chronic infection or aspiration. Similar appearance of probable radiation induced consolidation within the lingula and anterior left lower lobe. Anterior left lower lobe nodularity is similar. Subcarinal adenopathy,  mild and new since 02/18/2021. Most likely reactive. Metastatic disease could look similar. Recommend attention on follow-up. Coronary artery atherosclerosis. Aortic Atherosclerosis (ICD10-I70.0). Right hepatic  lobe metastasis, suboptimally evaluated secondary to bolus timing. 06/15: ABG~pH 7.42/pCO2 33/pO2 76/acid-base deficit 2.4; d-dimer 9.06 06/15: CXR~worsening airspace disease and/or edema   Resolved Hospital Problem list   N/A  Assessment & Plan:  Acute on chronic hypoxic respiratory failure secondary COVID-19 pneumonia with suspected bacterial infection vs. pulmonary edema further complicated by COPD Mechanical ventilation via ARDS protocol, target PRVC 6 cc/kg Wean PEEP and FiO2 as able to maintain O2 sats >88% Goal plateau pressure less than 30, driving pressure less than 15 Paralytics if necessary for vent synchrony, gas exchange Cycle prone positioning if necessary for oxygenation Deep sedation per PAD protocol, goal RASS -3, currently fentanyl and propofol  Diuresis as blood pressure and renal function can tolerate, goal CVP 5-8.  BNP pending  VAP prevention order set Remdesivir, plan for 5 days Steroids Follow inflammatory markers: Ferritin, D-dimer, CRP, IL-6, LDH Vitamin C, zinc Will check resp cultures  Will check pct if elevated will started cefepime  Sinus tachycardia  Continuous telemetry monitoring Will check troponin   Hypokalemia  Hypomagnesia Trend BMP  Replace electrolytes as indicated  Monitor UOP Avoid nephrotoxic medications   Pancytopenia likely secondary to stage IV poorly differentiated adenocarcinoma with unclear primary (suspected to be lung vs. hepatobiliary) Trend CBC Monitor for s/sx of bleeding and transfuse for hgb <7  Type II diabetes mellitus  CBG q4hrs SSI   GERD Scheduled IV protonix   Best practice (right click and "Reselect all SmartList Selections" daily)  Diet:  NPO Pain/Anxiety/Delirium protocol (if indicated): Yes (RASS goal -3) VAP protocol (if indicated): Yes DVT prophylaxis: LMWH GI prophylaxis: PPI Glucose control:  SSI Yes Central venous access: Right chest portacath  Arterial line:  N/A Foley:  N/A Mobility:  bed  rest  PT consulted: N/A Last date of multidisciplinary goals of care discussion [N/A] Code Status:  full code Disposition: ICU  Labs   CBC: Recent Labs  Lab 03/18/21 0521 03/19/21 0434 03/20/21 0457 03/21/21 0614 03/24/21 0630  WBC 3.0* 3.2* 8.1 5.9 10.9*  NEUTROABS 2.3 2.8 7.4 5.2 9.8*  HGB 7.6* 7.4* 11.6* 9.4* 8.8*  HCT 23.0* 21.7* 33.8* 27.1* 27.3*  MCV 101.3* 98.6 95.2 95.8 101.9*  PLT 142* 132* 152 108* PLATELET CLUMPS NOTED ON SMEAR, UNABLE TO ESTIMATE     Basic Metabolic Panel: Recent Labs  Lab 03/20/21 0457 03/20/21 1413 03/21/21 0614 03/22/21 0504 03/23/21 0604 03/24/21 0630  NA 142  --  138 138 140 141  K 2.9* 4.6 4.6 4.5 4.8 4.8  CL 107  --  111 113* 112* 113*  CO2 21*  --  22 19* 19* 23  GLUCOSE 138*  --  223* 213* 235* 157*  BUN 25*  --  47* 62* 68* 78*  CREATININE 0.93  --  1.20* 1.13* 1.11* 1.34*  CALCIUM 7.7*  --  7.4* 7.7* 7.6* 7.6*  MG 1.7 1.6* 2.0  --  1.9 2.1  PHOS 4.8* 3.5 3.6  --  3.9 4.1    GFR: Estimated Creatinine Clearance: 27.6 mL/min (A) (by C-G formula based on SCr of 1.34 mg/dL (H)). Recent Labs  Lab 03/18/21 0521 03/19/21 0434 03/20/21 0457 03/20/21 0652 03/21/21 0614 03/21/21 1240 03/24/21 0630  PROCALCITON 0.49  --   --  0.69  --  0.51  --   WBC 3.0* 3.2* 8.1  --  5.9  --  10.9*     Liver Function Tests: Recent Labs  Lab 03/19/21 0434 03/20/21 0457 03/21/21 0614 03/22/21 0504 03/24/21 0630  AST 42* 184* 92* 81* 75*  ALT 22 74* 67* 65* 49*  ALKPHOS 142* 248* 196* 205* 180*  BILITOT 0.4 0.3 0.4 0.5 0.7  PROT 6.0* 6.7 5.6* 5.7* 5.7*  ALBUMIN 1.6* 1.8* 1.5* 1.8* 1.5*    No results for input(s): LIPASE, AMYLASE in the last 168 hours. No results for input(s): AMMONIA in the last 168 hours.  ABG    Component Value Date/Time   PHART 7.41 03/22/2021 2100   PCO2ART 29 (L) 03/22/2021 2100   PO2ART 70 (L) 03/22/2021 2100   HCO3 18.4 (L) 03/22/2021 2100   ACIDBASEDEF 5.3 (H) 03/22/2021 2100   O2SAT 94.0  03/22/2021 2100      Coagulation Profile: No results for input(s): INR, PROTIME in the last 168 hours.   Cardiac Enzymes: No results for input(s): CKTOTAL, CKMB, CKMBINDEX, TROPONINI in the last 168 hours.  HbA1C: Hgb A1c MFr Bld  Date/Time Value Ref Range Status  03/19/2021 04:34 AM 7.5 (H) 4.8 - 5.6 % Final    Comment:    (NOTE)         Prediabetes: 5.7 - 6.4         Diabetes: >6.4         Glycemic control for adults with diabetes: <7.0     CBG: Recent Labs  Lab 03/23/21 2317 03/23/21 2351 03/24/21 0430 03/24/21 0741 03/24/21 1113  GLUCAP 207* 224* 193* 138* 132*     Review of Systems:   Pt mechanically intubated   Past Medical History:  She,  has a past medical history of CKD (chronic kidney disease), stage IV ([place]), Colon polyps, COPD (chronic obstructive pulmonary disease) ([place]), Diabetes mellitus without complication ([place]), GERD (gastroesophageal reflux disease), Headache, Hepatitis, Hyperlipidemia, IBS (irritable bowel syndrome), Peptic ulcer, Tobacco abuse, Vertigo, and Wears dentures.   Surgical History:   Past Surgical History:  Procedure Laterality Date   ABDOMINAL HYSTERECTOMY     carbuncle removal     urethra   CATARACT EXTRACTION W/PHACO Left 10/29/2020   Procedure: CATARACT EXTRACTION PHACO AND INTRAOCULAR LENS PLACEMENT (IOC) LEFT DIABETIC 1051 01:00.0 ;  Surgeon: Eulogio Bear, MD;  Location: [place];  Service: Ophthalmology;  Laterality: Left;  Diabetic - insulin   CATARACT EXTRACTION W/PHACO Right 11/19/2020   Procedure: CATARACT EXTRACTION PHACO AND INTRAOCULAR LENS PLACEMENT (IOC) RIGHT DIABETIC;  Surgeon: Eulogio Bear, MD;  Location: [place];  Service: Ophthalmology;  Laterality: Right;  16.54 1:40.0   COLONOSCOPY     COLONOSCOPY WITH PROPOFOL N/A 11/05/2017   Procedure: COLONOSCOPY WITH PROPOFOL;  Surgeon: Lollie Sails, MD;  Location[facility] ENDOSCOPY;  Service: Endoscopy;  Laterality: N/A;    ESOPHAGOGASTRODUODENOSCOPY     ESOPHAGOGASTRODUODENOSCOPY (EGD) WITH PROPOFOL N/A 11/05/2017   Procedure: ESOPHAGOGASTRODUODENOSCOPY (EGD) WITH PROPOFOL;  Surgeon: Lollie Sails, MD;  Location: [facility] ENDOSCOPY;  Service: Endoscopy;  Laterality: N/A;   IR IMAGING GUIDED PORT INSERTION  12/13/2020   IR IMAGING GUIDED PORT INSERTION  01/25/2021   VIDEO BRONCHOSCOPY WITH ENDOBRONCHIAL NAVIGATION N/A 12/02/2019   Procedure: VIDEO BRONCHOSCOPY WITH ENDOBRONCHIAL NAVIGATION;  Surgeon: Ottie Glazier, MD;  Location: [facility] ORS;  Service: Thoracic;  Laterality: N/A;   VIDEO BRONCHOSCOPY WITH ENDOBRONCHIAL ULTRASOUND N/A 12/02/2019   Procedure: VIDEO BRONCHOSCOPY WITH ENDOBRONCHIAL ULTRASOUND;  Surgeon: Ottie Glazier, MD;  Location: [facility] ORS;  Service: Thoracic;  Laterality: N/A;  Social History:   reports that she has been smoking cigarettes. She has a 11.25 pack-year smoking history. She has never used smokeless tobacco. She reports that she does not drink alcohol and does not use drugs.   Family History:  Her family history is negative for Breast cancer.   Allergies Allergies  Allergen Reactions   Penicillins Anaphylaxis and Rash    Did it involve swelling of the face/tongue/throat, SOB, or low BP? Yes Did it involve sudden or severe rash/hives, skin peeling, or any reaction on the inside of your mouth or nose? Yes Did you need to seek medical attention at a hospital or doctor's office? Yes When did it last happen?More than 40 years ago If all above answers are "NO", may proceed with cephalosporin use.    Metformin Other (See Comments)    Upset stomach   Sulfa Antibiotics Nausea And Vomiting     Home Medications  Prior to Admission medications   Medication Sig Start Date End Date Taking? Authorizing Provider  acetaminophen (TYLENOL) 500 MG tablet Take 1,000 mg by mouth every 6 (six) hours as needed for mild pain or moderate pain.   Yes [provider]  albuterol (VENTOLIN  HFA) 108 (90 Base) MCG/ACT inhaler Inhale 1-2 puffs into the lungs every 6 (six) hours as needed for wheezing or shortness of breath. 02/10/20  Yes Cammie Sickle, MD  chlorpheniramine-HYDROcodone (TUSSIONEX) 10-8 MG/5ML SUER Take 5 mLs by mouth at bedtime as needed for cough. Patient taking differently: Take 5 mLs by mouth 3 (three) times daily as needed for cough. 02/08/21  Yes Cammie Sickle, MD  diazepam (VALIUM) 5 MG tablet Take 5 mg by mouth daily as needed (for vertigo).   Yes [provider]  ezetimibe (ZETIA) 10 MG tablet Take 10 mg by mouth daily.    Yes [provider]  Fluticasone-Salmeterol (ADVAIR) 250-50 MCG/DOSE AEPB Inhale 1 puff into the lungs 2 (two) times daily.   Yes [provider]  folic acid (FOLVITE) 1 MG tablet Take 1 tablet (1 mg total) by mouth daily. 12/14/20  Yes Cammie Sickle, MD  hyoscyamine (LEVBID) 0.375 MG 12 hr tablet Take 0.375 mg by mouth daily as needed (abdominal cramping).  03/24/19  Yes [provider]  Ipratropium-Albuterol (COMBIVENT) 20-100 MCG/ACT AERS respimat Inhale 1 puff into the lungs every 4 (four) hours as needed for wheezing.   Yes [provider]  ipratropium-albuterol (DUONEB) 0.5-2.5 (3) MG/3ML SOLN Take 3 mLs by nebulization every 6 (six) hours as needed (wheezing). 12/28/20  Yes Verlon Au, NP  LANTUS SOLOSTAR 100 UNIT/ML Solostar Pen Inject 12-20 Units into the skin See admin instructions. Inject 12u under the skin every morning and inject 20u under the skin at bedtime   Yes [provider]  lidocaine-prilocaine (EMLA) cream Apply 1 application topically as needed. 12/05/20  Yes Cammie Sickle, MD  megestrol (MEGACE ES) 625 MG/5ML suspension Take 5 mLs (625 mg total) by mouth daily. 02/25/21  Yes Cammie Sickle, MD  Multiple Vitamin (MULTIVITAMIN WITH MINERALS) TABS tablet Take 1 tablet by mouth daily. One-A-Day for Women   Yes [provider]   nystatin (MYCOSTATIN) 100000 UNIT/ML suspension Take 5 mLs by mouth 2 (two) times daily as needed (throat irritation.).   Yes [provider]  omeprazole (PRILOSEC) 20 MG capsule Take 20 mg by mouth daily before breakfast.    Yes [provider]  ondansetron (ZOFRAN) 8 MG tablet Take 1 tablet (8 mg  total) by mouth every 8 (eight) hours as needed for nausea or vomiting. 12/05/20  Yes Cammie Sickle, MD  prochlorperazine (COMPAZINE) 10 MG tablet Take 1 tablet (10 mg total) by mouth every 6 (six) hours as needed for nausea or vomiting. 12/05/20  Yes Cammie Sickle, MD  rosuvastatin (CRESTOR) 40 MG tablet Take 40 mg by mouth every evening.    Yes [provider]  vitamin B-12 (CYANOCOBALAMIN) 1000 MCG tablet Take 1,000 mcg by mouth daily.   Yes [provider]     Critical care provider statement:    Critical care time (minutes):  33   Critical care time was exclusive of:  Separately billable procedures and  treating other patients   Critical care was necessary to treat or prevent imminent or  life-threatening deterioration of the following conditions:  COVID19 pneumonia, lung cancer , bilateral pleural effusions, metastatic cancer, chronic severe anemia requiring blood transfusion.    Critical care was time spent personally by me on the following  activities:  Development of treatment plan with patient or surrogate,  discussions with consultants, evaluation of patient's response to  treatment, examination of patient, obtaining history from patient or  surrogate, ordering and performing treatments and interventions, ordering  and review of laboratory studies and re-evaluation of patient's condition   I assumed direction of critical care for this patient from another  provider in my specialty: no          Ottie Glazier, M.D.  Pulmonary & [place]

## 2021-03-24 NOTE — Progress Notes (Signed)
Bladder scanned patient to check for retention since UOP has decreased significantly, bladder scan showed 669 ml's in bladder, notified NP, received order for foley

## 2021-03-24 NOTE — Consult Note (Signed)
PHARMACY CONSULT NOTE  Pharmacy Consult for Electrolyte Monitoring and Replacement   Recent Labs: Potassium (mmol/L)  Date Value  03/24/2021 4.8   Magnesium (mg/dL)  Date Value  03/24/2021 2.1   Calcium (mg/dL)  Date Value  03/24/2021 7.6 (L)   Albumin (g/dL)  Date Value  03/24/2021 1.5 (L)   Phosphorus (mg/dL)  Date Value  03/24/2021 4.1   Sodium (mmol/L)  Date Value  03/24/2021 141    Assessment: Patient is a 75 y/o F with medical history including COPD, stage IV poorly differentiated adenocarcinoma on chemotherapy (parimary lung vs cholangio-/pancreaticobiliary), CKD, diabetes, GERD, IBS, hepatitis who is admitted with acute on chronic respiratory failure secondary to COVID-19 pneumonia / superimposed bacterial infection / pulmonary edema. Pharmacy consulted to assist with electrolyte monitoring and replacement as indicated.  Patient is currently intubated, sedated, and on MV in the ICU. Weaning vasopressors.  Goal of Therapy:  Electrolytes within normal limits  Plan:  --No electrolyte replacement warranted at this time --Follow-up electrolytes in AM  Armie Moren A 03/24/2021 9:51 AM

## 2021-03-24 NOTE — TOC Progression Note (Signed)
Transition of Care [facility]) - Progression Note    Patient Details  Name: Donna Brennan MRN: [id number] Date of Birth: [birth year]  Transition of Care [facility]) CM/SW [place], [place] Phone Number: [phone] 03/24/2021, 10:49 AM  Clinical Narrative:     Patients from home presents to [facility] due to cough and weakness.  Main contact is Loura Halt (Daughter) [phone] (Work Phone) or  Freddi Forster (Spouse) [phone].  Patient self extubated overnight in 6/18 and was emergently re-intubated.  ENT consultation and meeting with family requested.   Expected Discharge Plan: [place] Barriers to Discharge: Continued Medical Work up  Expected Discharge Plan and Services Expected Discharge Plan: [place] In-house Referral: Clinical Social Work   Post Acute Care Choice: [place] Living arrangements for the past 2 months: Single Family Home                                       Social Determinants of Health (SDOH) Interventions    Readmission Risk Interventions No flowsheet data found.

## 2021-03-24 NOTE — Progress Notes (Signed)
PHARMACIST - PHYSICIAN COMMUNICATION  CONCERNING:  Enoxaparin (Lovenox) for DVT Prophylaxis    RECOMMENDATION: Patient was prescribed enoxaprin 40mg  q24 hours for VTE prophylaxis.   Filed Weights   03/17/2021 1211  Weight: 56.7 kg (125 lb)    Body mass index is 23.24 kg/m.  Estimated Creatinine Clearance: 27.6 mL/min (A) (by C-G formula based on SCr of 1.34 mg/dL (H)).  Patient is candidate for enoxaparin 30mg  every 24 hours based on CrCl <33ml/min   DESCRIPTION: Pharmacy has adjusted enoxaparin dose per [facility] policy.  Patient is now receiving enoxaparin 30 mg every 24 hours    Dallie Piles, PharmD Clinical Pharmacist  03/24/2021 9:04 AM

## 2021-03-24 NOTE — Progress Notes (Signed)
Patient requiring increased FiO2, PEEP and multiple paralytic IVP since 19:00. Patient then developed significant hypoxic at 79-82%. Domenic Moras, RT bedside with nursing.  Adjusted ventilator settings: FiO2 100%, PEEP 15 > SpO2 82%. Titrated PEEP to 18 without impact. BP trending down  - STAT ABG, plan to prone if P/F ratio < 100 - continue vecuronium IVP for now, switching to continuous drip - lasix x 1 - STAT CXR - add levophed drip - called daughter Faythe Dingwall who is headed to the patient's husband's house and will call back for a plan of care discussion.    Domingo Pulse [place], AGACNP-BC Acute Care Nurse Practitioner [facility]   [phone] / [phone] Please see Amion for pager details.

## 2021-03-24 NOTE — Progress Notes (Signed)
@  1541 vecuronium 10mg  given for vent dyssynchrony. Daughter updated at the bedside.  Oxygen sats improved with vecuronium.

## 2021-03-25 ENCOUNTER — Encounter: Payer: Self-pay | Admitting: Internal Medicine

## 2021-03-25 DIAGNOSIS — U071 COVID-19: Secondary | ICD-10-CM | POA: Diagnosis not present

## 2021-03-25 DIAGNOSIS — J9600 Acute respiratory failure, unspecified whether with hypoxia or hypercapnia: Secondary | ICD-10-CM | POA: Diagnosis not present

## 2021-03-25 DIAGNOSIS — C3432 Malignant neoplasm of lower lobe, left bronchus or lung: Secondary | ICD-10-CM | POA: Diagnosis not present

## 2021-03-25 LAB — BLOOD GAS, ARTERIAL
Acid-base deficit: 10 mmol/L — ABNORMAL HIGH (ref 0.0–2.0)
Acid-base deficit: 8.1 mmol/L — ABNORMAL HIGH (ref 0.0–2.0)
Bicarbonate: 16.7 mmol/L — ABNORMAL LOW (ref 20.0–28.0)
Bicarbonate: 17.8 mmol/L — ABNORMAL LOW (ref 20.0–28.0)
FIO2: 1
FIO2: 1
MECHVT: 450 mL
MECHVT: 450 mL
O2 Saturation: 86.6 %
O2 Saturation: 92.8 %
PEEP: 15 cmH2O
PEEP: 15 cmH2O
Patient temperature: 37
Patient temperature: 37
RATE: 25 resp/min
RATE: 25 resp/min
pCO2 arterial: 37 mmHg (ref 32.0–48.0)
pCO2 arterial: 39 mmHg (ref 32.0–48.0)
pH, Arterial: 7.24 — ABNORMAL LOW (ref 7.350–7.450)
pH, Arterial: 7.29 — ABNORMAL LOW (ref 7.350–7.450)
pO2, Arterial: 62 mmHg — ABNORMAL LOW (ref 83.0–108.0)
pO2, Arterial: 74 mmHg — ABNORMAL LOW (ref 83.0–108.0)

## 2021-03-25 LAB — GLUCOSE, CAPILLARY
Glucose-Capillary: 232 mg/dL — ABNORMAL HIGH (ref 70–99)
Glucose-Capillary: 231 mg/dL — ABNORMAL HIGH (ref 70–99)
Glucose-Capillary: 174 mg/dL — ABNORMAL HIGH (ref 70–99)
Glucose-Capillary: 187 mg/dL — ABNORMAL HIGH (ref 70–99)

## 2021-03-25 MED ORDER — GLYCOPYRROLATE 0.2 MG/ML IJ SOLN
0.2000 mg | INTRAMUSCULAR | Status: DC | PRN
  Administered 2021-03-25: 0.2 mg via INTRAVENOUS
  Filled 2021-03-25: qty 1

## 2021-03-25 MED ORDER — VECURONIUM BOLUS VIA INFUSION
10.0000 mg | Freq: Once | INTRAVENOUS | Status: AC
Start: 2021-03-25 — End: 2021-03-25
  Administered 2021-03-25: 10 mg via INTRAVENOUS
  Filled 2021-03-25: qty 10

## 2021-03-25 MED ORDER — MORPHINE SULFATE (PF) 2 MG/ML IV SOLN: 2.0000 mg | INTRAVENOUS | Status: DC | PRN

## 2021-03-25 MED ORDER — GLYCOPYRROLATE 0.2 MG/ML IJ SOLN: 0.2000 mg | INTRAMUSCULAR | Status: DC | PRN

## 2021-03-25 MED ORDER — DIPHENHYDRAMINE HCL 50 MG/ML IJ SOLN: 25.0000 mg | INTRAMUSCULAR | Status: DC | PRN

## 2021-03-25 MED ORDER — MORPHINE 100MG IN NS 100ML (1MG/ML) PREMIX INFUSION
0.0000 mg/h | INTRAVENOUS | Status: DC
  Administered 2021-03-25: 5 mg/h via INTRAVENOUS
  Filled 2021-03-25: qty 100

## 2021-03-25 MED ORDER — LACTATED RINGERS IV BOLUS: 500.0000 mL | Freq: Once | INTRAVENOUS | Status: DC

## 2021-03-25 MED ORDER — METHYLPREDNISOLONE SODIUM SUCC 40 MG IJ SOLR
40.0000 mg | Freq: Two times a day (BID) | INTRAMUSCULAR | Status: DC
  Administered 2021-03-25: 40 mg via INTRAVENOUS
  Filled 2021-03-25: qty 1

## 2021-03-25 MED ORDER — VASOPRESSIN 20 UNITS/100 ML INFUSION FOR SHOCK
0.0000 [IU]/min | INTRAVENOUS | Status: DC
  Administered 2021-03-25: 0.04 [IU]/min via INTRAVENOUS
  Administered 2021-03-25: 0.03 [IU]/min via INTRAVENOUS
  Filled 2021-03-25 (×2): qty 100

## 2021-03-25 MED ORDER — POLYVINYL ALCOHOL 1.4 % OP SOLN
1.0000 [drp] | Freq: Four times a day (QID) | OPHTHALMIC | Status: DC | PRN
  Filled 2021-03-25: qty 15

## 2021-03-25 MED ORDER — HYDROCORTISONE NA SUCCINATE PF 100 MG IJ SOLR
50.0000 mg | Freq: Four times a day (QID) | INTRAMUSCULAR | Status: DC
  Administered 2021-03-25: 50 mg via INTRAVENOUS
  Filled 2021-03-25: qty 2

## 2021-03-25 MED ORDER — GLYCOPYRROLATE 1 MG PO TABS
1.0000 mg | ORAL_TABLET | ORAL | Status: DC | PRN
  Filled 2021-03-25: qty 1

## 2021-03-25 MED ORDER — ARTIFICIAL TEARS OPHTHALMIC OINT
1.0000 "application " | TOPICAL_OINTMENT | Freq: Three times a day (TID) | OPHTHALMIC | Status: DC
  Administered 2021-03-25 (×2): 1 via OPHTHALMIC
  Filled 2021-03-25: qty 3.5

## 2021-03-25 MED ORDER — DEXTROSE 5 % IV SOLN: INTRAVENOUS | Status: DC

## 2021-03-25 MED ORDER — ACETAMINOPHEN 650 MG RE SUPP: 650.0000 mg | Freq: Four times a day (QID) | RECTAL | Status: DC | PRN

## 2021-03-25 MED ORDER — ACETAMINOPHEN 325 MG PO TABS: 650.0000 mg | ORAL_TABLET | Freq: Four times a day (QID) | ORAL | Status: DC | PRN

## 2021-03-25 MED ORDER — SODIUM BICARBONATE 8.4 % IV SOLN
100.0000 meq | Freq: Once | INTRAVENOUS | Status: AC
  Administered 2021-03-25: 100 meq via INTRAVENOUS
  Filled 2021-03-25: qty 100

## 2021-03-25 MED ORDER — MORPHINE BOLUS VIA INFUSION
5.0000 mg | INTRAVENOUS | Status: DC | PRN
Start: 2021-03-25 — End: 2021-03-26
  Filled 2021-03-25: qty 5

## 2021-03-25 MED ORDER — VECURONIUM BROMIDE 10 MG IV SOLR
0.0000 ug/kg/min | INTRAVENOUS | Status: DC
Start: 2021-03-25 — End: 2021-03-25
  Administered 2021-03-25: 1 ug/kg/min via INTRAVENOUS
  Filled 2021-03-25: qty 100

## 2021-03-26 ENCOUNTER — Telehealth: Payer: Self-pay | Admitting: *Deleted

## 2021-03-26 NOTE — Telephone Encounter (Signed)
RN received FMLA papers on 2021-04-15 for patient's daughter. Reviewed chart RN Spoke with daughter. Condolences offered to family. Per [name] no need to complete fmla papers at this time.

## 2021-04-01 ENCOUNTER — Inpatient Hospital Stay: Payer: Medicare Other

## 2021-04-01 ENCOUNTER — Inpatient Hospital Stay: Payer: Medicare Other | Admitting: Internal Medicine

## 2021-04-05 NOTE — Death Summary Note (Signed)
DEATH SUMMARY   Patient Details  Name: Donna Brennan MRN: [id number] DOB: 01-27-1946  Admission/Discharge Information   Admit Date:  April 13, 2021  Date of Death: Date of Death: 04-22-21  Time of Death: Time of Death: 01/11/52  Length of Stay: 2023/01/10  Referring Physician: Adin Hector, MD   Reason(s) for Hospitalization  Acute on Chronic Hypoxic Respiratory Failure COVID-19 Infection ARDS Septic Shock Hypokalemia Hypomagnesia Pancytopenia Diabetes Mellitus Type II GERD  Diagnoses  Preliminary cause of death:  Secondary Diagnoses (including complications and co-morbidities):  Principal Problem:   Sepsis ([place]) Active Problems:   Hypertension   CKD stage 3 due to type 2 diabetes mellitus ([place])   Primary cancer of left lower lobe of lung ([place])   Pneumonia due to COVID-19 virus   Acute respiratory failure due to COVID-19 [facility])   COPD with acute exacerbation (HCC)   AKI (acute kidney injury) ([place])   Endotracheal tube present   SOB (shortness of breath)   Palliative care encounter   Brief Hospital Course (including significant findings, care, treatment, and services provided and events leading to death)  [name] is a 75 yo female with a PMH of COPD and 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 cholangio-/pancreaticobiliary).  She presented to [facility] ER from home on 04/14/2023 with c/o weakness, productive cough, and shortness of breath.  The morning of 04-14-2023 she tested positive for COVID via home test, she also tested positive for COVID on 06/6.  Pts husband COVID positive.  She has received 2 doses of COVID-19 vaccine, however she has not had a booster dose.      Upon arrival to the ER O2 sats were 85% on RA and pt noted to have increased work of breathing, therefore she was placed on 2L O2 via nasal canula.  CXR revealed chronic patchy density over the left mid to lower lung concerning for atelectasis vs. early superimposed infection.   CTA Chest negative for pulmonary embolism but concerning for COVID-19 pneumonia and chronic infection or aspiration.  She received iv solumedrol, empiric iv abx (rocephin and azithromycin), and remdesivir per protocol.  She was subsequently admitted to the [facility] unit per hospitalist team for additional workup and treatment.     Hospital course complicated by worsening acute hypoxic respiratory failure requiring pt transfer to ICU for emergent intubation on 06/15.   03/23/21- patient had self extubation overnight and was emergently re-intubated.  We have reached out to family for meeting and we have asked ENT for consultation as per family request.   03/24/21- Patient is relatively unchanged today on PRVC. April 22, 2021-  Severe Hypoxia overnight, P:F ratio 74, now requiring proning and PRN IVP of Paralytics, now Limited Code (only intubation); family considering transition to Comfort Measures later this afternoon once all family arrives    Pertinent Labs and Studies  Significant Diagnostic Studies DG Abd 1 View  Result Date: 03/22/2021 CLINICAL DATA:  OG tube placement. EXAM: ABDOMEN - 1 VIEW COMPARISON:  One-view abdomen 03/20/2021 FINDINGS: Bowel gas pattern is normal. Side port of the OG tube is in the fundus of the stomach. IMPRESSION: OG tube in the fundus of the stomach. Electronically Signed   By: San Morelle M.D.   On: 03/22/2021 20:55   DG Abd 1 View  Result Date: 03/20/2021 CLINICAL DATA:  ET and NG placement EXAM: PORTABLE CHEST - 1 VIEW; ABDOMEN - 1 VIEW COMPARISON:  CT a chest 04/13/21, CT chest, abdomen pelvis 02/19/2071, chest radiograph  03/20/2021 FINDINGS: Accessed right IJ approach Port-A-Cath tip terminates in the mid SVC. Endotracheal tube tip terminates in the lower trachea 2.3 cm in the carina. Consider retraction 2 cm to the mid trachea. Transesophageal tube tip terminates in the region of the distal gastric body with the side port distal to the GE junction. Telemetry  leads and external support devices overlie the chest and abdomen Diffuse heterogeneous opacities again seen throughout both lungs, right greater than left with particular confluence in the right perihilar region. Some increasing left perihilar opacity is noted as well with fissural and septal thickening throughout both lungs and indistinct pulmonary vascularity. The aorta is calcified. The remaining cardiomediastinal contours are unremarkable. Degenerative changes are present in the imaged spine and shoulders. No visible subdiaphragmatic free air. Upper abdominal bowel gas pattern is unremarkable. Mild circumferential body wall edema. Multilevel degenerative changes in the shoulders and spine and partially imaged SI joints. IMPRESSION: 1. Endotracheal tube tip terminates in the lower trachea, 2.3 cm from the carina. Consider retraction 2 cm to position in the mid trachea. 2. Appropriate positioning of the transesophageal tube. 3. Accessed right IJ approach Port-A-Cath terminates in the mid SVC. 4. Increasing heterogeneous opacities throughout both lungs with some increasing confluence in the left perihilar space. Worsening fissural and septal thickening noted as well. Findings could reflect worsening airspace disease and/or edema. 5.  Aortic Atherosclerosis (ICD10-I70.0). These results will be called to the ordering clinician or representative by the Radiologist Assistant, and communication documented in the PACS or [employer]. Electronically Signed   By: Lovena Le M.D.   On: 03/20/2021 05:37   CT Angio Chest PE W and/or Wo Contrast  Result Date: 03/30/2021 CLINICAL DATA:  Weakness. Cough and shortness of breath. COVID positive. History of COPD. EXAM: CT ANGIOGRAPHY CHEST WITH CONTRAST TECHNIQUE: Multidetector CT imaging of the chest was performed using the standard protocol during bolus administration of intravenous contrast. Multiplanar CT image reconstructions and MIPs were obtained to evaluate the  vascular anatomy. CONTRAST:  102mL OMNIPAQUE IOHEXOL 350 MG/ML SOLN COMPARISON:  Plain film of earlier today.  Chest CT of 02/19/2021. FINDINGS: Cardiovascular: The quality of this exam for evaluation of pulmonary embolism is good. No evidence of pulmonary embolism. Right Port-A-Cath tip low SVC. Aortic atherosclerosis. Normal heart size, without pericardial effusion. Three vessel coronary artery calcification. Mediastinum/Nodes: No supraclavicular adenopathy. Index precarinal 7 mm node on 36/4 is similar to 8 mm on the prior. A node within the subcarinal station with extension into the azygoesophageal recess measures 1.4 cm on 44/4 versus 7 mm on the prior exam (when remeasured). Left perihilar soft tissue thickening which is likely radiation induced. No well-defined adenopathy. Lungs/Pleura: Trace left pleural fluid. Left endobronchial compression is felt to be similar to on the prior. Worsening left lower lobe aeration with progressive airspace and ground-glass opacity. Development of bilateral, right greater than left peripheral predominant ground-glass. Similar dependent lingular consolidation. Lateral left lower lobe 7 mm lung nodule is unchanged on 52/6. The more central anterior left lower lobe nodule is likely obscured by airspace disease. Upper Abdomen: Right hepatic lobe treated metastasis is not well evaluated secondary to bolus timing. High left hepatic lobe cysts. Normal imaged portions of the spleen, stomach, pancreas, gallbladder, kidneys, adrenal glands. Abdominal aortic and branch vessel atherosclerosis. Musculoskeletal: No acute osseous abnormality. Review of the MIP images confirms the above findings. IMPRESSION: 1.  No evidence of pulmonary embolism. 2. Development of multifocal peripheral predominant ground-glass opacities, highly suspicious for COVID-19 pneumonia. 3.  Left lower lobe pulmonary opacities are slightly progressive and likely represent a combination of COVID-19 pneumonia and chronic  infection or aspiration. 4. Similar appearance of probable radiation induced consolidation within the lingula and anterior left lower lobe. Anterior left lower lobe nodularity is similar. 5. Subcarinal adenopathy, mild and new since 02/18/2021. Most likely reactive. Metastatic disease could look similar. Recommend attention on follow-up. 6. Coronary artery atherosclerosis. Aortic Atherosclerosis (ICD10-I70.0). 7. Right hepatic lobe metastasis, suboptimally evaluated secondary to bolus timing. Electronically Signed   By: Abigail Miyamoto M.D.   On: 03/10/2021 14:56   DG SWALLOW FUNC OP MEDICARE SPEECH PATH  Result Date: 03/01/2021 Objective Swallowing Evaluation: Type of Study: MBS-Modified Barium Swallow Study  Patient Details Name: LATAYNA RITCHIE MRN: [id number] Date of Birth: [birth year] Today's Date: 03/01/2021 Time: SLP Start Time (ACUTE ONLY): 1330 -SLP Stop Time (ACUTE ONLY): 1400 SLP Time Calculation (min) (ACUTE ONLY): 30 min Past Medical History: Past Medical History: Diagnosis Date  CKD (chronic kidney disease), stage IV (HCC)   Colon polyps   COPD (chronic obstructive pulmonary disease) ([place])   Diabetes mellitus without complication (HCC)   GERD (gastroesophageal reflux disease)   Headache   Hepatitis   when in 4th or 5th grade.  Resolved  Hyperlipidemia   IBS (irritable bowel syndrome)   Peptic ulcer   Tobacco abuse   Vertigo   No "bad" episodes in over 1 year  Wears dentures   partial ujpper and lower Past Surgical History: Past Surgical History: Procedure Laterality Date  ABDOMINAL HYSTERECTOMY    carbuncle removal    urethra  CATARACT EXTRACTION W/PHACO Left 10/29/2020  Procedure: CATARACT EXTRACTION PHACO AND INTRAOCULAR LENS PLACEMENT (IOC) LEFT DIABETIC 1051 01:00.0 ;  Surgeon: Eulogio Bear, MD;  Location: [place];  Service: Ophthalmology;  Laterality: Left;  Diabetic - insulin  CATARACT EXTRACTION W/PHACO Right 11/19/2020  Procedure: CATARACT EXTRACTION PHACO AND INTRAOCULAR LENS  PLACEMENT (IOC) RIGHT DIABETIC;  Surgeon: Eulogio Bear, MD;  Location: [place];  Service: Ophthalmology;  Laterality: Right;  16.54 1:40.0  COLONOSCOPY    COLONOSCOPY WITH PROPOFOL N/A 11/05/2017  Procedure: COLONOSCOPY WITH PROPOFOL;  Surgeon: Lollie Sails, MD;  Location: [facility] ENDOSCOPY;  Service: Endoscopy;  Laterality: N/A;  ESOPHAGOGASTRODUODENOSCOPY    ESOPHAGOGASTRODUODENOSCOPY (EGD) WITH PROPOFOL N/A 11/05/2017  Procedure: ESOPHAGOGASTRODUODENOSCOPY (EGD) WITH PROPOFOL;  Surgeon: Lollie Sails, MD;  Location: [facility] ENDOSCOPY;  Service: Endoscopy;  Laterality: N/A;  IR IMAGING GUIDED PORT INSERTION  12/13/2020  IR IMAGING GUIDED PORT INSERTION  01/25/2021  VIDEO BRONCHOSCOPY WITH ENDOBRONCHIAL NAVIGATION N/A 12/02/2019  Procedure: VIDEO BRONCHOSCOPY WITH ENDOBRONCHIAL NAVIGATION;  Surgeon: Ottie Glazier, MD;  Location: [facility] ORS;  Service: Thoracic;  Laterality: N/A;  VIDEO BRONCHOSCOPY WITH ENDOBRONCHIAL ULTRASOUND N/A 12/02/2019  Procedure: VIDEO BRONCHOSCOPY WITH ENDOBRONCHIAL ULTRASOUND;  Surgeon: Ottie Glazier, MD;  Location: [facility] ORS;  Service: Thoracic;  Laterality: N/A; HPI: Pt is a 75 year old female who was referred by Dr Rogue Bussing d/t difficulty swallowing. Pt is currently undergoing treatment adenocarcinoma-s/p liver biopsy (?  Recurrent lung cancer versus cholangiocarcinoma) thought clinically lung cancer status post carbo-Keytruda.  Subjective: pt pleasant, good historian Assessment / Plan / Recommendation CHL IP CLINICAL IMPRESSIONS 03/01/2021 Clinical Impression Pt demonstrates adequate oropharyngeal abilities when consuming nectar thick liquids, thin liquids, puree, graham crackers and whole barium tablet. Penetration and aspiration were not observed during the study. Behaviorally, pt has sensation to effortfully swallow "to get it to go down." Pt does have indentation of the posterior pharyngeal wall  from anterior cervical spine osteophytes and prominent  cricopharyngeus but bolus was not impeded. Pt does endorse strong family history of esophageal strictures. MD may wish to consider an eosphageal assessment if pt is interested. SLP Visit Diagnosis Dysphagia, unspecified (R13.10) Attention and concentration deficit following -- Frontal lobe and executive function deficit following -- Impact on safety and function No limitations   CHL IP TREATMENT RECOMMENDATION 03/01/2021 Treatment Recommendations No treatment recommended at this time   No flowsheet data found. CHL IP DIET RECOMMENDATION 03/01/2021 SLP Diet Recommendations Regular solids;Thin liquid Liquid Administration via Cup;Straw Medication Administration Whole meds with liquid Compensations Minimize environmental distractions;Slow rate;Small sips/bites Postural Changes Remain semi-upright after after feeds/meals (Comment);Seated upright at 90 degrees   CHL IP OTHER RECOMMENDATIONS 03/01/2021 Recommended Consults Consider esophageal assessment Oral Care Recommendations Oral care BID Other Recommendations --   CHL IP FOLLOW UP RECOMMENDATIONS 03/01/2021 Follow up Recommendations None   No flowsheet data found.     CHL IP ORAL PHASE 03/01/2021 Oral Phase WFL Oral - Pudding Teaspoon -- Oral - Pudding Cup -- Oral - Honey Teaspoon -- Oral - Honey Cup -- Oral - Nectar Teaspoon -- Oral - Nectar Cup -- Oral - Nectar Straw -- Oral - Thin Teaspoon -- Oral - Thin Cup -- Oral - Thin Straw -- Oral - Puree -- Oral - Mech Soft -- Oral - Regular -- Oral - Multi-Consistency -- Oral - Pill -- Oral Phase - Comment --  CHL IP PHARYNGEAL PHASE 03/01/2021 Pharyngeal Phase WFL Pharyngeal- Pudding Teaspoon -- Pharyngeal -- Pharyngeal- Pudding Cup -- Pharyngeal -- Pharyngeal- Honey Teaspoon -- Pharyngeal -- Pharyngeal- Honey Cup -- Pharyngeal -- Pharyngeal- Nectar Teaspoon -- Pharyngeal -- Pharyngeal- Nectar Cup -- Pharyngeal -- Pharyngeal- Nectar Straw -- Pharyngeal -- Pharyngeal- Thin Teaspoon -- Pharyngeal -- Pharyngeal- Thin Cup --  Pharyngeal -- Pharyngeal- Thin Straw -- Pharyngeal -- Pharyngeal- Puree -- Pharyngeal -- Pharyngeal- Mechanical Soft -- Pharyngeal -- Pharyngeal- Regular -- Pharyngeal -- Pharyngeal- Multi-consistency -- Pharyngeal -- Pharyngeal- Pill -- Pharyngeal -- Pharyngeal Comment --  CHL IP CERVICAL ESOPHAGEAL PHASE 03/01/2021 Cervical Esophageal Phase (No Data) Pudding Teaspoon -- Pudding Cup -- Honey Teaspoon -- Honey Cup -- Nectar Teaspoon -- Nectar Cup -- Nectar Straw -- Thin Teaspoon -- Thin Cup -- Thin Straw -- Puree -- Mechanical Soft -- Regular -- Multi-consistency -- Pill -- Cervical Esophageal Comment -- Happi B. [place] Nail M.S., CCC-SLP, [place] Office [phone] Happi [place] Nail 03/01/2021, 2:42 PM            CLINICAL DATA:  Possible aspiration EXAM: MODIFIED BARIUM SWALLOW TECHNIQUE: Different consistencies of barium were administered orally to the patient by the Speech Pathologist. Imaging of the pharynx was performed in the lateral projection. The radiologist was present in the fluoroscopy room for this study, providing personal supervision. FLUOROSCOPY TIME:  Fluoroscopy Time:  1 minutes 36 seconds Radiation Exposure Index (if provided by the fluoroscopic device): 3.5 mGy COMPARISON:  None. FINDINGS: No aspiration with any attempted consistency. There is indentation of the posterior pharyngeal wall from anterior cervical spine osteophytes and prominent cricopharyngeus without holdup of contrast. IMPRESSION: No aspiration. Please refer to the Speech Pathologists report for complete details and recommendations. Electronically Signed   By: Macy Mis M.D.   On: 03/01/2021 13:53   US Venous Img Lower Bilateral (DVT)  Result Date: 03/20/2021 CLINICAL DATA:  Swelling EXAM: BILATERAL LOWER EXTREMITY VENOUS DOPPLER ULTRASOUND TECHNIQUE: Gray-scale sonography with compression, as well as color and duplex ultrasound, were performed to evaluate  the deep venous system(s)  from the level of the common femoral vein through the popliteal and proximal calf veins. COMPARISON:  None. FINDINGS: VENOUS Normal compressibility of the common femoral, superficial femoral, and popliteal veins, as well as the visualized calf veins. Visualized portions of profunda femoral vein and great saphenous vein unremarkable. No filling defects to suggest DVT on grayscale or color Doppler imaging. Doppler waveforms show normal direction of venous flow, normal respiratory phasicity and response to augmentation. OTHER None. Limitations: none IMPRESSION: No femoropopliteal DVT nor evidence of DVT within the visualized calf veins. If clinical symptoms are inconsistent or if there are persistent or worsening symptoms, further imaging (possibly involving the iliac veins) may be warranted. Electronically Signed   By: Lucrezia Europe M.D.   On: 03/20/2021 15:33   DG Chest Port 1 View  Result Date: 03/24/2021 CLINICAL DATA:  Shortness of breath, COVID-19 positivity EXAM: PORTABLE CHEST 1 VIEW COMPARISON:  03/22/2021 FINDINGS: Endotracheal tube and gastric catheter are again seen and stable. Right chest wall port is again noted. Diffuse bilateral airspace opacities are noted right considerably greater than left are noted and stable from the prior exam. No new focal abnormality is seen. IMPRESSION: Stable bilateral airspace opacity right greater than left. Electronically Signed   By: Inez Catalina M.D.   On: 03/24/2021 23:59   DG Chest Port 1 View  Result Date: 03/22/2021 CLINICAL DATA:  Intubation.  OG tube placement. EXAM: PORTABLE CHEST 1 VIEW COMPARISON:  One-view chest x-ray 03/20/2021 FINDINGS: Heart size normal. Endotracheal tube terminates 3 cm above the carina. OG tube courses off the inferior border the film. Right IJ Port-A-Cath is stable. Asymmetric right-sided airspace disease similar the prior exam. Patchy airspace opacities noted on the left. IMPRESSION: 1. Satisfactory positioning of endotracheal tube.  2. Stable asymmetric right-sided airspace disease compatible with pneumonia. Electronically Signed   By: San Morelle M.D.   On: 03/22/2021 20:52   DG Chest Port 1 View  Result Date: 03/20/2021 CLINICAL DATA:  ET and NG placement EXAM: PORTABLE CHEST - 1 VIEW; ABDOMEN - 1 VIEW COMPARISON:  CT a chest 03/26/2021, CT chest, abdomen pelvis 02/19/2071, chest radiograph 03/20/2021 FINDINGS: Accessed right IJ approach Port-A-Cath tip terminates in the mid SVC. Endotracheal tube tip terminates in the lower trachea 2.3 cm in the carina. Consider retraction 2 cm to the mid trachea. Transesophageal tube tip terminates in the region of the distal gastric body with the side port distal to the GE junction. Telemetry leads and external support devices overlie the chest and abdomen Diffuse heterogeneous opacities again seen throughout both lungs, right greater than left with particular confluence in the right perihilar region. Some increasing left perihilar opacity is noted as well with fissural and septal thickening throughout both lungs and indistinct pulmonary vascularity. The aorta is calcified. The remaining cardiomediastinal contours are unremarkable. Degenerative changes are present in the imaged spine and shoulders. No visible subdiaphragmatic free air. Upper abdominal bowel gas pattern is unremarkable. Mild circumferential body wall edema. Multilevel degenerative changes in the shoulders and spine and partially imaged SI joints. IMPRESSION: 1. Endotracheal tube tip terminates in the lower trachea, 2.3 cm from the carina. Consider retraction 2 cm to position in the mid trachea. 2. Appropriate positioning of the transesophageal tube. 3. Accessed right IJ approach Port-A-Cath terminates in the mid SVC. 4. Increasing heterogeneous opacities throughout both lungs with some increasing confluence in the left perihilar space. Worsening fissural and septal thickening noted as well. Findings could reflect worsening  airspace disease and/or edema. 5.  Aortic Atherosclerosis (ICD10-I70.0). These results will be called to the ordering clinician or representative by the Radiologist Assistant, and communication documented in the PACS or [employer]. Electronically Signed   By: Lovena Le M.D.   On: 03/20/2021 05:37   DG Chest Port 1 View  Result Date: 03/20/2021 CLINICAL DATA:  Shortness of breath, COVID EXAM: PORTABLE CHEST 1 VIEW COMPARISON:  03/11/2021 FINDINGS: Airspace disease throughout the right lung and in the left lower lobe compatible with pneumonia. Heart is normal size. No effusions or pneumothorax. No acute bony abnormality. Right Port-A-Cath remains in stable position with the tip in the SVC. IMPRESSION: Diffuse right lung airspace disease and airspace disease in the left lower lobe. Findings compatible with pneumonia. Electronically Signed   By: Rolm Baptise M.D.   On: 03/20/2021 01:02   DG Chest Portable 1 View  Result Date: 03/11/2021 CLINICAL DATA:  Shortness of breath with productive cough and weakness. COVID-19 positive this morning. EXAM: PORTABLE CHEST 1 VIEW COMPARISON:  12/28/2020 and chest CT 02/18/2021 FINDINGS: Right IJ Port-A-Cath unchanged. Lungs are adequately inflated demonstrate patchy density over the left mid to lower lung which may be slightly worse in the left base. Extensive bilateral costochondral calcification. Cardiomediastinal silhouette and remainder of the exam is unchanged. IMPRESSION: Chronic patchy density over the left mid to lower lung. This may be slightly worse over the left base which may be due to atelectasis or early superimposed infection. Electronically Signed   By: Marin Olp M.D.   On: 03/14/2021 13:08   ECHOCARDIOGRAM COMPLETE  Result Date: 03/23/2021    ECHOCARDIOGRAM REPORT   Patient Name:   PEGGE CUMBERLEDGE Date of Exam: 03/22/2021 Medical Rec #:  [id number]      Height:       61.5 in Accession #:    [id number]     Weight:       125.0 lb Date of Birth:   07/02/1946      BSA:          1.556 m Patient Age:    75 years       BP:           110/63 mmHg Patient Gender: F              HR:           62 bpm. Exam Location:  [facility] Procedure: 2D Echo, Color Doppler and Cardiac Doppler Indications:     F02.63 CHF-Acute Systolic  History:         Patient has no prior history of Echocardiogram examinations.                  COPD and CKD; Risk Factors:Current Smoker, Dyslipidemia and                  Diabetes. Hx of lung cancer.  Sonographer:     Charmayne Sheer RDCS (AE) Referring Phys:  [id number] Ottie Glazier Diagnosing Phys: Neoma Laming MD  Sonographer Comments: Echo performed with patient supine and on artificial respirator and suboptimal parasternal window. IMPRESSIONS  1. Left ventricular ejection fraction, by estimation, is 60 to 65%. The left ventricle has normal function. The left ventricle has no regional wall motion abnormalities. There is mild concentric left ventricular hypertrophy. Left ventricular diastolic parameters are consistent with Grade I diastolic dysfunction (impaired relaxation).  2. Right ventricular systolic function is normal. The right ventricular size is normal.  3. The mitral valve is  normal in structure. Mild to moderate mitral valve regurgitation. No evidence of mitral stenosis.  4. Tricuspid valve regurgitation is mild to moderate.  5. The aortic valve is normal in structure. Aortic valve regurgitation is mild. Mild aortic valve sclerosis is present, with no evidence of aortic valve stenosis.  6. The inferior vena cava is normal in size with greater than 50% respiratory variability, suggesting right atrial pressure of 3 mmHg. FINDINGS  Left Ventricle: Left ventricular ejection fraction, by estimation, is 60 to 65%. The left ventricle has normal function. The left ventricle has no regional wall motion abnormalities. The left ventricular internal cavity size was normal in size. There is  mild concentric left ventricular hypertrophy. Left ventricular  diastolic parameters are consistent with Grade I diastolic dysfunction (impaired relaxation). Right Ventricle: The right ventricular size is normal. No increase in right ventricular wall thickness. Right ventricular systolic function is normal. Left Atrium: Left atrial size was normal in size. Right Atrium: Right atrial size was normal in size. Pericardium: There is no evidence of pericardial effusion. Mitral Valve: The mitral valve is normal in structure. Mild to moderate mitral valve regurgitation. No evidence of mitral valve stenosis. MV peak gradient, 5.3 mmHg. The mean mitral valve gradient is 2.0 mmHg. Tricuspid Valve: The tricuspid valve is normal in structure. Tricuspid valve regurgitation is mild to moderate. No evidence of tricuspid stenosis. Aortic Valve: The aortic valve is normal in structure. Aortic valve regurgitation is mild. Mild aortic valve sclerosis is present, with no evidence of aortic valve stenosis. Aortic valve mean gradient measures 5.0 mmHg. Aortic valve peak gradient measures 8.0 mmHg. Aortic valve area, by VTI measures 1.66 cm. Pulmonic Valve: The pulmonic valve was normal in structure. Pulmonic valve regurgitation is not visualized. No evidence of pulmonic stenosis. Aorta: The aortic root is normal in size and structure. Venous: The inferior vena cava is normal in size with greater than 50% respiratory variability, suggesting right atrial pressure of 3 mmHg. IAS/Shunts: No atrial level shunt detected by color flow Doppler.  LEFT VENTRICLE PLAX 2D LVIDd:         4.10 cm  Diastology LVIDs:         2.60 cm  LV e' medial:    4.90 cm/s LV PW:         0.80 cm  LV E/e' medial:  18.0 LV IVS:        0.70 cm  LV e' lateral:   3.92 cm/s LVOT diam:     2.10 cm  LV E/e' lateral: 22.5 LV SV:         48 LV SV Index:   31 LVOT Area:     3.46 cm  RIGHT VENTRICLE RV Basal diam:  2.60 cm LEFT ATRIUM           Index       RIGHT ATRIUM          Index LA diam:      2.90 cm 1.86 cm/m  RA Area:     5.46 cm  LA Vol (A4C): 18.4 ml 11.82 ml/m RA Volume:   7.17 ml  4.61 ml/m  AORTIC VALVE                    PULMONIC VALVE AV Area (Vmax):    1.86 cm     PV Vmax:       0.57 m/s AV Area (Vmean):   1.48 cm     PV Vmean:  40.700 cm/s AV Area (VTI):     1.66 cm     PV VTI:        0.113 m AV Vmax:           141.00 cm/s  PV Peak grad:  1.3 mmHg AV Vmean:          102.000 cm/s PV Mean grad:  1.0 mmHg AV VTI:            0.288 m AV Peak Grad:      8.0 mmHg AV Mean Grad:      5.0 mmHg LVOT Vmax:         75.70 cm/s LVOT Vmean:        43.600 cm/s LVOT VTI:          0.138 m LVOT/AV VTI ratio: 0.48  AORTA Ao Root diam: 2.90 cm MITRAL VALVE MV Area (PHT): 4.74 cm    SHUNTS MV Area VTI:   1.54 cm    Systemic VTI:  0.14 m MV Peak grad:  5.3 mmHg    Systemic Diam: 2.10 cm MV Mean grad:  2.0 mmHg MV Vmax:       1.15 m/s MV Vmean:      55.5 cm/s MV Decel Time: 160 msec MV E velocity: 88.30 cm/s MV A velocity: 57.40 cm/s MV E/A ratio:  1.54 Neoma Laming MD Electronically signed by Neoma Laming MD Signature Date/Time: 03/23/2021/10:11:17 AM    Final     Microbiology No results found for this or any previous visit (from the past 240 hour(s)).  Lab Basic Metabolic Panel: Recent Labs  Lab 03/20/21 0457 03/20/21 1413 03/21/21 0614 03/22/21 0504 03/23/21 0604 03/24/21 0630  NA 142  --  138 138 140 141  K 2.9* 4.6 4.6 4.5 4.8 4.8  CL 107  --  111 113* 112* 113*  CO2 21*  --  22 19* 19* 23  GLUCOSE 138*  --  223* 213* 235* 157*  BUN 25*  --  47* 62* 68* 78*  CREATININE 0.93  --  1.20* 1.13* 1.11* 1.34*  CALCIUM 7.7*  --  7.4* 7.7* 7.6* 7.6*  MG 1.7 1.6* 2.0  --  1.9 2.1  PHOS 4.8* 3.5 3.6  --  3.9 4.1   Liver Function Tests: Recent Labs  Lab 03/20/21 0457 03/21/21 0614 03/22/21 0504 03/24/21 0630  AST 184* 92* 81* 75*  ALT 74* 67* 65* 49*  ALKPHOS 248* 196* 205* 180*  BILITOT 0.3 0.4 0.5 0.7  PROT 6.7 5.6* 5.7* 5.7*  ALBUMIN 1.8* 1.5* 1.8* 1.5*   No results for input(s): LIPASE, AMYLASE in the last 168  hours. No results for input(s): AMMONIA in the last 168 hours. CBC: Recent Labs  Lab 03/20/21 0457 03/21/21 0614 03/24/21 0630 03/24/21 2322  WBC 8.1 5.9 10.9*  --   NEUTROABS 7.4 5.2 9.8*  --   HGB 11.6* 9.4* 8.8* 9.5*  HCT 33.8* 27.1* 27.3* 30.2*  MCV 95.2 95.8 101.9*  --   PLT 152 108* PLATELET CLUMPS NOTED ON SMEAR, UNABLE TO ESTIMATE  --    Cardiac Enzymes: No results for input(s): CKTOTAL, CKMB, CKMBINDEX, TROPONINI in the last 168 hours. Sepsis Labs: Recent Labs  Lab 03/20/21 0457 03/20/21 0652 03/21/21 0614 03/21/21 1240 03/24/21 0630  PROCALCITON  --  0.69  --  0.51  --   WBC 8.1  --  5.9  --  10.9*    Procedures/Operations  6/15: Endotracheal intubation 6/17: Required reintubation (pt self extubated)  Darel Hong, AGACNP-BC [facility] Prefer epic messenger for cross cover needs If after hours, please call E-link

## 2021-04-05 NOTE — Progress Notes (Signed)
Goals of Care  Spoke with husband, Patrick Jupiter & daughters, via telephone updating and notifying of patients medical condition. Patient significantly hypoxic and in severe ARDS on maximal ventilator support. Explained the concern that the patient may cardiac arrest at any time.  Explained to family course of therapy and the modalities.  We discussed pronation therapy as the last option to help support the patient's lungs.  Family understands the situation.  They have consented and agreed to LIMITED CODE with no CPR/ACLS.  We will continue all other therapies and plan to escalate to pronation therapy as needed.   Family are satisfied with Plan of action and management. All questions answered  Additional CC time 32 mins   Domingo Pulse Rust[place], AGACNP-BC [facility]    Please see Amion for pager details.

## 2021-04-05 NOTE — Progress Notes (Signed)
Donna Brennan   DOB:Feb 17, 1946   GL#:[id number]    Subjective: Patient is intubated; sedated.  Over the weekend patient accidentally self extubated herself.  She needed to be reintubated.  Is currently on 100% FiO2; also on 3 pressors.  Family-daughter Donna Brennan and husband by the bedside.  Objective:  Vitals:   03-31-21 1700 Mar 31, 2021 1853  BP: (!) 95/55   Pulse:    Resp: (!) 25 (!) 0  Temp: (!) 96.98 F (36.1 C)   SpO2:       Intake/Output Summary (Last 24 hours) at 03/26/2021 0748 Last data filed at 03/31/21 1900 Gross per 24 hour  Intake 1707.18 ml  Output 68 ml  Net 1639.18 ml    Physical Exam Vitals and nursing note reviewed.  Constitutional:      Comments: Intubated/sedated.  In prone position.  Family by the bedside.  She is on 3 pressors; 100% FiO2  HENT:     Head: Normocephalic and atraumatic.  Cardiovascular:     Rate and Rhythm: Regular rhythm. Tachycardia present.  Pulmonary:     Comments: Decreased breath sounds bilaterally.  Coarse breath sounds Abdominal:     General: Abdomen is flat.     Palpations: Abdomen is soft.  Skin:    General: Skin is warm and dry.  Neurological:     Comments: Sedated     Labs:  Lab Results  Component Value Date   WBC 10.9 (H) 03/24/2021   HGB 9.5 (L) 03/24/2021   HCT 30.2 (L) 03/24/2021   MCV 101.9 (H) 03/24/2021   PLT PLATELET CLUMPS NOTED ON SMEAR, UNABLE TO ESTIMATE 03/24/2021   NEUTROABS 9.8 (H) 03/24/2021    Lab Results  Component Value Date   NA 141 03/24/2021   K 4.8 03/24/2021   CL 113 (H) 03/24/2021   CO2 23 03/24/2021    Studies:  DG Chest Port 1 View  Result Date: 03/24/2021 CLINICAL DATA:  Shortness of breath, COVID-19 positivity EXAM: PORTABLE CHEST 1 VIEW COMPARISON:  03/22/2021 FINDINGS: Endotracheal tube and gastric catheter are again seen and stable. Right chest wall port is again noted. Diffuse bilateral airspace opacities are noted right considerably greater than left are noted and stable from the  prior exam. No new focal abnormality is seen. IMPRESSION: Stable bilateral airspace opacity right greater than left. Electronically Signed   By: Inez Catalina M.D.   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  See below  #Acute on chronic respiratory failure-multifactorial-COVID infection/superimposed bacterial pneumonia/underlying COPD emphysema-s/p intubation ; on three pressors -currently on 100 FiO2.  Significant decline in respiratory status-see below  Recommendation/plan:  #I had a long discussion with the patient's daughter Donna Brennan; and the patient's husband regarding the significant decline in the patient's clinical status over the last 2 to 3 days.  She is currently maximally supported on the ventilator and also blood pressures.  Discussed that given her multiple comorbidities including underlying incurable cancer//advanced COPD/also superimposed COVID infection/pneumonia would make her recovery/back to baseline impossible.  Daughter and husband expressed that patient would not have wanted on the ventilator/ICU without any significant chance of recovery.   They understand that patient will pass within hours of terminal extubation. Family in agreement with terminal extubation/comfort measures.  Also discussed with ICU nurse.  # 40 minutes face-to-face with the patient's family discussing the above plan of care; more than 50% of time spent on prognosis/ natural history; counseling and coordination.    Cammie Sickle, MD

## 2021-04-05 NOTE — Progress Notes (Signed)
PCCM note  Discussed with spouse Patrick Jupiter in the ICU.  The whole family is at bedside and they have requested transition to comfort measures and compassionate extubation.  Orders placed  Marshell Garfinkel MD [facility] See Amion for pager  If no response to pager , please call [phone] until 7pm After 7:00 pm call Elink  [phone] Apr 21, 2021, 4:42 PM

## 2021-04-05 NOTE — Plan of Care (Signed)
Events: Patient remained fairly stable throughout the shift and stayed prone until comfort measures were taking place. She was surrounded by family during her passing, which she appeared comfortable throughout the process. Belongings were given to daughter, [name]. Husband, Patrick Jupiter signed off paperwork for the funeral home. Postmortem care to be done by night shift.   Problem: Education: Goal: Knowledge of General Education information will improve Description: Including pain rating scale, medication(s)/side effects and non-pharmacologic comfort measures Outcome: Completed/Met   Problem: Health Behavior/Discharge Planning: Goal: Ability to manage health-related needs will improve Outcome: Completed/Met   Problem: Clinical Measurements: Goal: Ability to maintain clinical measurements within normal limits will improve Outcome: Completed/Met Goal: Will remain free from infection Outcome: Completed/Met Goal: Diagnostic test results will improve Outcome: Completed/Met Goal: Respiratory complications will improve Outcome: Completed/Met Goal: Cardiovascular complication will be avoided Outcome: Completed/Met   Problem: Activity: Goal: Risk for activity intolerance will decrease Outcome: Completed/Met   Problem: Nutrition: Goal: Adequate nutrition will be maintained Outcome: Completed/Met   Problem: Coping: Goal: Level of anxiety will decrease Outcome: Completed/Met   Problem: Elimination: Goal: Will not experience complications related to bowel motility Outcome: Completed/Met Goal: Will not experience complications related to urinary retention Outcome: Completed/Met   Problem: Pain Managment: Goal: General experience of comfort will improve Outcome: Completed/Met   Problem: Safety: Goal: Ability to remain free from injury will improve Outcome: Completed/Met   Problem: Skin Integrity: Goal: Risk for impaired skin integrity will decrease Outcome: Completed/Met

## 2021-04-05 NOTE — Progress Notes (Signed)
NAME:  Donna Brennan, MRN:  [id number], DOB:  [birth year], LOS: 9 ADMISSION DATE:  03/11/2021, CONSULTATION DATE: 03/20/2021 REFERRING MD: Dr. 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 cholangio-/pancreaticobiliary).  She presented to [facility] ER from home on 06/11 with c/o weakness, productive cough, and shortness of breath.  The morning of 06/11 she tested positive for COVID via home test, she also tested positive for COVID on 06/6.  Pts husband COVID positive.  She has received 2 doses of COVID-19 vaccine, however she has not had a booster dose.     Upon arrival to the ER O2 sats were 85% on RA and pt noted to have increased work of breathing, therefore she was placed on 2L O2 via nasal canula.  CXR revealed chronic patchy density over the left mid to lower lung concerning for atelectasis vs. early superimposed infection.  CTA Chest negative for pulmonary embolism but concerning for COVID-19 pneumonia and chronic infection or aspiration.  She received iv solumedrol, empiric iv abx (rocephin and azithromycin), and remdesivir per protocol.  She was subsequently admitted to the [facility] unit per hospitalist team for additional workup and treatment.    Hospital course complicated by worsening acute hypoxic respiratory failure requiring pt transfer to ICU for emergent intubation on 06/15.   03/23/21- patient had self extubation overnight and was emergently re-intubated.  We have reached out to family for meeting and we have asked ENT for consultation as per family request.   03/24/21- Patient is relatively unchanged today on PRVC.  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 cholangio-/pancreaticobiliary) COPD Stage IV CKD Colon Polyps  Type II Diabetes Mellitus GERD Headache  Hepatitis  HLD IBS Tobacco Abuse Abuse  Vertigo   Objective   Blood pressure (!) 117/48, pulse (!) 103, temperature (!) 97.16 F (36.2 C), temperature source Esophageal, resp. rate (!) 0, height 5' 1.5" (1.562 m), weight 56.7 kg, SpO2 95 %.    Vent Mode: PRVC FiO2 (%):  [65 %-100 %] 100 % Set Rate:  [25 bmp] 25 bmp Vt Set:  [450 mL] 450 mL PEEP:  [10 cmH20-15 cmH20] 15 cmH20 Plateau Pressure:  [32 cmH20-44 cmH20] 44 cmH20   Intake/Output Summary (Last 24 hours) at 2021/04/01 0845 Last data filed at 04/01/2021 0700 Gross per 24 hour  Intake 2343.6 ml  Output 595 ml  Net 1748.6 ml    Filed Weights   03/23/2021 1211  Weight: 56.7 kg    Examination: General: Critically ill-appearing female, currently in prone position, intubated and sedated, no acute distress HENT: Atraumatic, normocephalic, neck supple, Lungs: Coarse breath sounds bilaterally, no rales or wheezing noted, synchronous with the ventilator, even Cardiovascular: Tachycardia, regular rhythm, S1-S2, no murmurs, rubs, gallops, 1+ distal pulses Abdomen: Unable to assess due to proning position Extremities: Normal bulk and tone, no deformities, trace edema bilateral lower extremities, bilateral feet becoming cyanotic Neuro: Heavily sedated due to proning and ARDS, does not follow commands GU: Foley catheter in place Skin: Warm and dry.  No obvious rashes, lesions, ulcerations  Labs/imaging that I havepersonally reviewed  (right click and "  Reselect all SmartList Selections" daily)  06/11: CTA Chest revealed no evidence of pulmonary embolism. Development of multifocal peripheral predominant ground-glass opacities, highly suspicious for COVID-19 pneumonia. Left lower lobe pulmonary  opacities are slightly progressive and likely represent a combination of COVID-19 pneumonia and chronic infection or aspiration. Similar appearance of probable radiation induced consolidation within the lingula and anterior left lower lobe. Anterior left lower lobe nodularity is similar. Subcarinal adenopathy, mild and new since 02/18/2021. Most likely reactive. Metastatic disease could look similar. Recommend attention on follow-up. Coronary artery atherosclerosis. Aortic Atherosclerosis (ICD10-I70.0). Right hepatic lobe metastasis, suboptimally evaluated secondary to bolus timing. 06/15: ABG~pH 7.42/pCO2 33/pO2 76/acid-base deficit 2.4; d-dimer 9.06 06/15: CXR~worsening airspace disease and/or edema   Resolved Hospital Problem list   N/A  Assessment & Plan:   Acute on chronic hypoxic respiratory failure secondary COVID-19 pneumonia with suspected bacterial infection vs. pulmonary edema further complicated by COPD ARDS -Mechanical ventilation via ARDS protocol, target PRVC 6 cc/kg -Wean PEEP and FiO2 as able to maintain O2 sats >88% -Goal plateau pressure less than 30, driving pressure less than 15 -Paralytics if necessary for vent synchrony, gas exchange -Cycle prone positioning if necessary for oxygenation -Deep sedation per PAD protocol, goal RASS -4, currently fentanyl, midazolam -Follow intermittent CXR & ABG as needed -Diuresis as blood pressure and renal function can tolerate, goal CVP 5-8.   -VAP prevention order set -Completed course of Remdesivir -IV Steroids -Follow inflammatory markers: Ferritin, D-dimer, CRP -Assess for possible tocilizumab  -Vitamin C, zinc -Plan to repeat and check resp cultures  Septic Shock -Continuous cardiac monitor -Maintain MAP greater than 65 -Cautious IV fluids given severe ARDS -Vasopressors as needed to maintain MAP goal -Add stress dose steroids -Echocardiogram on 03/22/2021 with LVEF 60 to 97%, grade 1 diastolic dysfunction, RV systolic  function normal  Hypokalemia  Hypomagnesia -Monitor I&O's / urinary output -Follow BMP -Ensure adequate renal perfusion -Avoid nephrotoxic agents as able -Replace electrolytes as indicated  Pancytopenia likely secondary to stage IV poorly differentiated adenocarcinoma with unclear primary (suspected to be lung vs. hepatobiliary) -Monitor for S/Sx of bleeding -Trend CBC -Lovenox for VTE Prophylaxis  -Transfuse for Hgb <7 -Oncology following, appreciate input  Type II diabetes mellitus  -CBG's -SSI -Follow ICU Hypo/Hyperglycemia protocol  GERD -Scheduled IV protonix    Pt is critically ill, prognosis is guarded, and long term prognosis is extremely poor given previous co-morbidities.  High risk for cardiac arrest and death.  Pt's family is considering transition to COMFORT MEASURES this afternoon once all family has arrived.  Best practice (right click and "Reselect all SmartList Selections" daily)  Diet:  NPO Pain/Anxiety/Delirium protocol (if indicated): Yes (RASS goal -3) VAP protocol (if indicated): Yes DVT prophylaxis: LMWH GI prophylaxis: PPI Glucose control:  SSI Yes Central venous access: Right chest portacath  Arterial line:  N/A Foley:  yes Mobility:  bed rest  PT consulted: N/A Last date of multidisciplinary goals of care discussion [03-31-2021] Code Status:  Limited Code (intubation only) Disposition: ICU  Labs   CBC: Recent Labs  Lab 03/19/21 0434 03/20/21 0457 03/21/21 0614 03/24/21 0630 03/24/21 2322  WBC 3.2* 8.1 5.9 10.9*  --   NEUTROABS 2.8 7.4 5.2 9.8*  --   HGB 7.4* 11.6* 9.4* 8.8* 9.5*  HCT 21.7* 33.8* 27.1* 27.3* 30.2*  MCV 98.6 95.2 95.8 101.9*  --   PLT 132* 152 108* PLATELET CLUMPS NOTED ON SMEAR, UNABLE TO ESTIMATE  --      Basic Metabolic Panel: Recent Labs  Lab 03/20/21 0457 03/20/21 1413 03/21/21 0614 03/22/21 0504 03/23/21 0604 03/24/21 0630  NA 142  --  138 138 140 141  K 2.9* 4.6 4.6 4.5 4.8 4.8  CL 107  --  111  113* 112* 113*  CO2 21*  --  22 19* 19* 23  GLUCOSE 138*  --  223* 213* 235* 157*  BUN 25*  --  47* 62* 68* 78*  CREATININE 0.93  --  1.20* 1.13* 1.11* 1.34*  CALCIUM 7.7*  --  7.4* 7.7* 7.6* 7.6*  MG 1.7 1.6* 2.0  --  1.9 2.1  PHOS 4.8* 3.5 3.6  --  3.9 4.1    GFR: Estimated Creatinine Clearance: 27.6 mL/min (A) (by C-G formula based on SCr of 1.34 mg/dL (H)). Recent Labs  Lab 03/19/21 0434 03/20/21 0457 03/20/21 0652 03/21/21 0614 03/21/21 1240 03/24/21 0630  PROCALCITON  --   --  0.69  --  0.51  --   WBC 3.2* 8.1  --  5.9  --  10.9*     Liver Function Tests: Recent Labs  Lab 03/19/21 0434 03/20/21 0457 03/21/21 0614 03/22/21 0504 03/24/21 0630  AST 42* 184* 92* 81* 75*  ALT 22 74* 67* 65* 49*  ALKPHOS 142* 248* 196* 205* 180*  BILITOT 0.4 0.3 0.4 0.5 0.7  PROT 6.0* 6.7 5.6* 5.7* 5.7*  ALBUMIN 1.6* 1.8* 1.5* 1.8* 1.5*    No results for input(s): LIPASE, AMYLASE in the last 168 hours. No results for input(s): AMMONIA in the last 168 hours.  ABG    Component Value Date/Time   PHART 7.29 (L) 2021-04-24 0425   PCO2ART 37 04/24/21 0425   PO2ART 74 (L) 04-24-21 0425   HCO3 17.8 (L) 04-24-21 0425   ACIDBASEDEF 8.1 (H) Apr 24, 2021 0425   O2SAT 92.8 04-24-2021 0425      Coagulation Profile: Recent Labs  Lab 03/24/21 2322  INR 1.5*     Cardiac Enzymes: No results for input(s): CKTOTAL, CKMB, CKMBINDEX, TROPONINI in the last 168 hours.  HbA1C: Hgb A1c MFr Bld  Date/Time Value Ref Range Status  03/19/2021 04:34 AM 7.5 (H) 4.8 - 5.6 % Final    Comment:    (NOTE)         Prediabetes: 5.7 - 6.4         Diabetes: >6.4         Glycemic control for adults with diabetes: <7.0     CBG: Recent Labs  Lab 03/24/21 1609 03/24/21 1937 03/24/21 2347 04-24-2021 0414 04/24/2021 0731  GLUCAP 171* 244* 233* 232* 231*     Review of Systems:   Pt mechanically intubated   Past Medical History:  She,  has a past medical history of CKD (chronic kidney  disease), stage IV ([place]), Colon polyps, COPD (chronic obstructive pulmonary disease) ([place]), Diabetes mellitus without complication ([place]), GERD (gastroesophageal reflux disease), Headache, Hepatitis, Hyperlipidemia, IBS (irritable bowel syndrome), Peptic ulcer, Tobacco abuse, Vertigo, and Wears dentures.   Surgical History:   Past Surgical History:  Procedure Laterality Date   ABDOMINAL HYSTERECTOMY     carbuncle removal     urethra   CATARACT EXTRACTION W/PHACO Left 10/29/2020   Procedure: CATARACT EXTRACTION PHACO AND INTRAOCULAR LENS PLACEMENT (IOC) LEFT DIABETIC 1051 01:00.0 ;  Surgeon: Eulogio Bear, MD;  Location: [place];  Service: Ophthalmology;  Laterality: Left;  Diabetic - insulin   CATARACT EXTRACTION W/PHACO Right 11/19/2020   Procedure: CATARACT EXTRACTION PHACO AND INTRAOCULAR LENS PLACEMENT (IOC) RIGHT DIABETIC;  Surgeon:  Eulogio Bear, MD;  Location: [place];  Service: Ophthalmology;  Laterality: Right;  16.54 1:40.0   COLONOSCOPY     COLONOSCOPY WITH PROPOFOL N/A 11/05/2017   Procedure: COLONOSCOPY WITH PROPOFOL;  Surgeon: Lollie Sails, MD;  Location[facility] ENDOSCOPY;  Service: Endoscopy;  Laterality: N/A;   ESOPHAGOGASTRODUODENOSCOPY     ESOPHAGOGASTRODUODENOSCOPY (EGD) WITH PROPOFOL N/A 11/05/2017   Procedure: ESOPHAGOGASTRODUODENOSCOPY (EGD) WITH PROPOFOL;  Surgeon: Lollie Sails, MD;  Location: [facility] ENDOSCOPY;  Service: Endoscopy;  Laterality: N/A;   IR IMAGING GUIDED PORT INSERTION  12/13/2020   IR IMAGING GUIDED PORT INSERTION  01/25/2021   VIDEO BRONCHOSCOPY WITH ENDOBRONCHIAL NAVIGATION N/A 12/02/2019   Procedure: VIDEO BRONCHOSCOPY WITH ENDOBRONCHIAL NAVIGATION;  Surgeon: Ottie Glazier, MD;  Location: [facility] ORS;  Service: Thoracic;  Laterality: N/A;   VIDEO BRONCHOSCOPY WITH ENDOBRONCHIAL ULTRASOUND N/A 12/02/2019   Procedure: VIDEO BRONCHOSCOPY WITH ENDOBRONCHIAL ULTRASOUND;  Surgeon: Ottie Glazier, MD;  Location: [facility] ORS;   Service: Thoracic;  Laterality: N/A;     Social History:   reports that she has been smoking cigarettes. She has a 11.25 pack-year smoking history. She has never used smokeless tobacco. She reports that she does not drink alcohol and does not use drugs.   Family History:  Her family history is negative for Breast cancer.   Allergies Allergies  Allergen Reactions   Penicillins Anaphylaxis and Rash    Did it involve swelling of the face/tongue/throat, SOB, or low BP? Yes Did it involve sudden or severe rash/hives, skin peeling, or any reaction on the inside of your mouth or nose? Yes Did you need to seek medical attention at a hospital or doctor's office? Yes When did it last happen?More than 40 years ago If all above answers are "NO", may proceed with cephalosporin use.    Metformin Other (See Comments)    Upset stomach   Sulfa Antibiotics Nausea And Vomiting     Home Medications  Prior to Admission medications   Medication Sig Start Date End Date Taking? Authorizing Provider  acetaminophen (TYLENOL) 500 MG tablet Take 1,000 mg by mouth every 6 (six) hours as needed for mild pain or moderate pain.   Yes [provider]  albuterol (VENTOLIN HFA) 108 (90 Base) MCG/ACT inhaler Inhale 1-2 puffs into the lungs every 6 (six) hours as needed for wheezing or shortness of breath. 02/10/20  Yes Cammie Sickle, MD  chlorpheniramine-HYDROcodone (TUSSIONEX) 10-8 MG/5ML SUER Take 5 mLs by mouth at bedtime as needed for cough. Patient taking differently: Take 5 mLs by mouth 3 (three) times daily as needed for cough. 02/08/21  Yes Cammie Sickle, MD  diazepam (VALIUM) 5 MG tablet Take 5 mg by mouth daily as needed (for vertigo).   Yes [provider]  ezetimibe (ZETIA) 10 MG tablet Take 10 mg by mouth daily.    Yes [provider]  Fluticasone-Salmeterol (ADVAIR) 250-50 MCG/DOSE AEPB Inhale 1 puff into the lungs 2 (two) times daily.   Yes [provider]   folic acid (FOLVITE) 1 MG tablet Take 1 tablet (1 mg total) by mouth daily. 12/14/20  Yes Cammie Sickle, MD  hyoscyamine (LEVBID) 0.375 MG 12 hr tablet Take 0.375 mg by mouth daily as needed (abdominal cramping).  03/24/19  Yes [provider]  Ipratropium-Albuterol (COMBIVENT) 20-100 MCG/ACT AERS respimat Inhale 1 puff into the lungs every 4 (four) hours as needed for wheezing.   Yes [provider]  ipratropium-albuterol (DUONEB) 0.5-2.5 (3) MG/3ML SOLN Take 3  mLs by nebulization every 6 (six) hours as needed (wheezing). 12/28/20  Yes Verlon Au, NP  LANTUS SOLOSTAR 100 UNIT/ML Solostar Pen Inject 12-20 Units into the skin See admin instructions. Inject 12u under the skin every morning and inject 20u under the skin at bedtime   Yes [provider]  lidocaine-prilocaine (EMLA) cream Apply 1 application topically as needed. 12/05/20  Yes Cammie Sickle, MD  megestrol (MEGACE ES) 625 MG/5ML suspension Take 5 mLs (625 mg total) by mouth daily. 02/25/21  Yes Cammie Sickle, MD  Multiple Vitamin (MULTIVITAMIN WITH MINERALS) TABS tablet Take 1 tablet by mouth daily. One-A-Day for Women   Yes [provider]  nystatin (MYCOSTATIN) 100000 UNIT/ML suspension Take 5 mLs by mouth 2 (two) times daily as needed (throat irritation.).   Yes [provider]  omeprazole (PRILOSEC) 20 MG capsule Take 20 mg by mouth daily before breakfast.    Yes [provider]  ondansetron (ZOFRAN) 8 MG tablet Take 1 tablet (8 mg total) by mouth every 8 (eight) hours as needed for nausea or vomiting. 12/05/20  Yes Cammie Sickle, MD  prochlorperazine (COMPAZINE) 10 MG tablet Take 1 tablet (10 mg total) by mouth every 6 (six) hours as needed for nausea or vomiting. 12/05/20  Yes Cammie Sickle, MD  rosuvastatin (CRESTOR) 40 MG tablet Take 40 mg by mouth every evening.    Yes [provider]  vitamin B-12 (CYANOCOBALAMIN) 1000 MCG tablet Take  1,000 mcg by mouth daily.   Yes [provider]     Critical Care Time: 40 minutes   Darel Hong, AGACNP-BC [facility] Prefer epic messenger for cross cover needs If after hours, please call E-link

## 2021-04-05 DEATH — deceased

## 2021-06-12 ENCOUNTER — Ambulatory Visit: Payer: Medicare Other | Admitting: Radiation Oncology

## 2021-06-20 ENCOUNTER — Ambulatory Visit: Payer: Medicare Other | Admitting: Radiation Oncology

## 2021-07-17 IMAGING — MR MR HEAD WO/W CM
14 series · 47 of 48 positions shown · IV contrast (gadavist)
Comparison: None.

CLINICAL DATA: Non-small cell lung cancer.

EXAM:
MRI HEAD WITHOUT AND WITH CONTRAST
TECHNIQUE: Multiplanar, multiecho pulse sequences of the brain and surrounding
structures were obtained without and with intravenous contrast.
CONTRAST:  6mL GADAVIST GADOBUTROL 1 MMOL/ML IV SOLN

[Series 5: ax dwi_tracew · axial · 3.0mm · 0.65mm/px · z∈[-113,+39]mm · 4 of 48 slices shown]
[im 1/48]
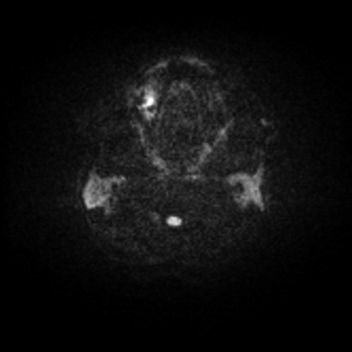
[im 16/48]
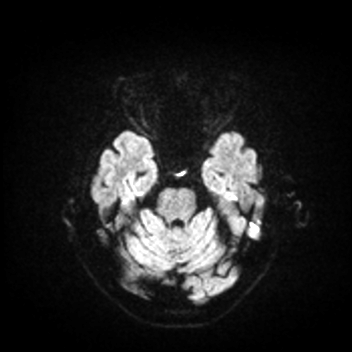
[im 32/48]
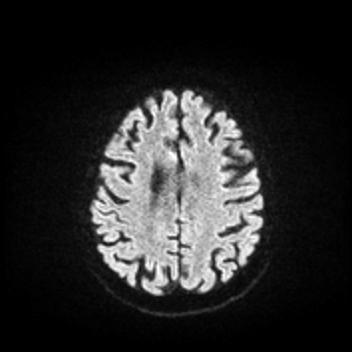
[im 48/48]
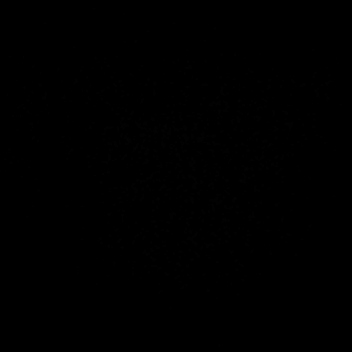

[Series 6: ax dwi_adc · axial · 3.0mm · 0.65mm/px · z∈[-113,+36]mm · 3 of 47 slices shown]
[im 1/47]
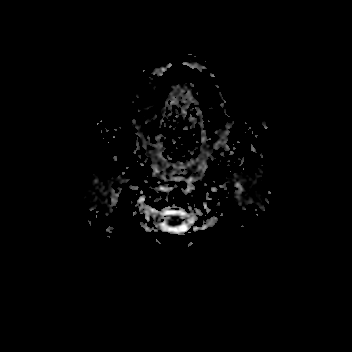
[im 24/47]
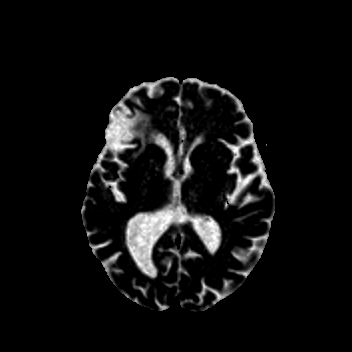
[im 47/47]
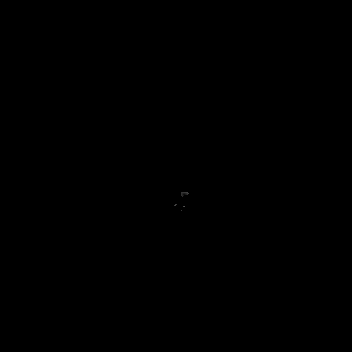

[Series 7: cor dwi_tracew · coronal · 5.0mm · 0.60mm/px · 2 of 38 slices shown]
[im 1/38]
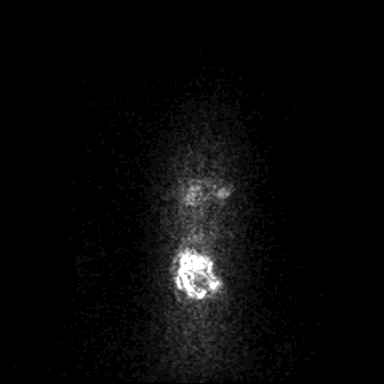
[im 38/38]
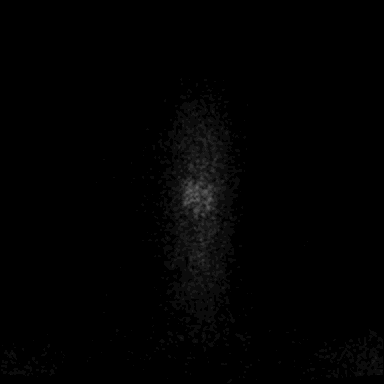

[Series 8: cor dwi_adc · coronal · 5.0mm · 0.60mm/px · 2 of 36 slices shown]
[im 1/36]
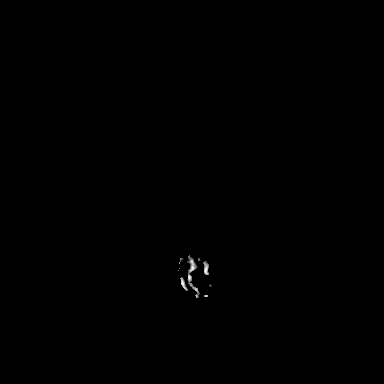
[im 36/36]
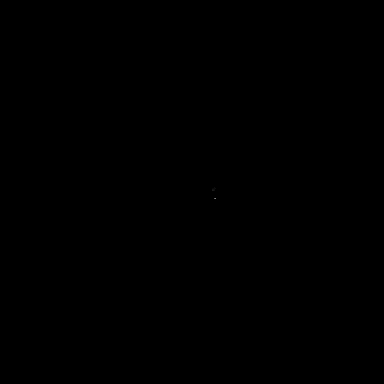

[Series 9: T1 · sagittal · 5.0mm · 0.62mm/px · 1 of 21 slices shown (1 of 2)]
[im 1/21]
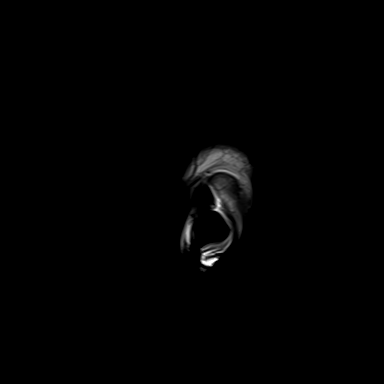

[Series 10: T2 · axial · 5.0mm · 0.53mm/px · 1 of 25 slices shown]
[im 1/25]
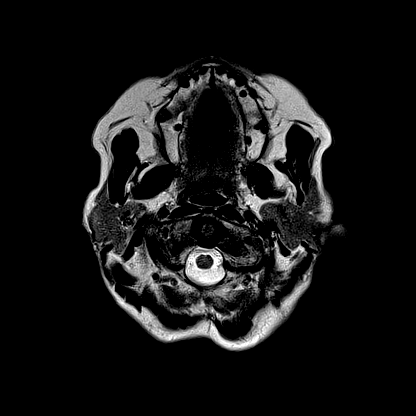

[Series 12: pha_images · axial · 3.0mm · 0.90mm/px · z∈[-124,+49]mm · 3 of 59 slices shown]
[im 1/59]
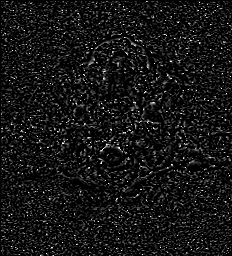
[im 30/59]
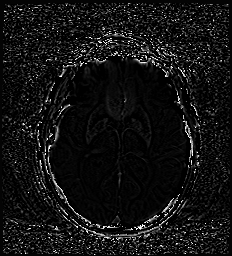
[im 59/59]
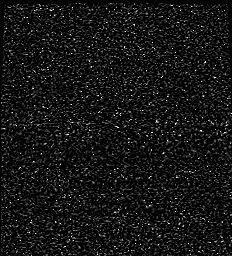

[Series 13: swi_images · axial · 3.0mm · 0.90mm/px · z∈[-124,+49]mm · 4 of 60 slices shown]
[im 1/60]
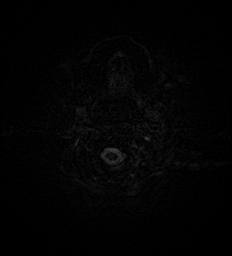
[im 20/60]
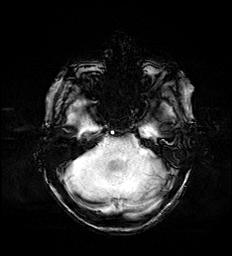
[im 40/60]
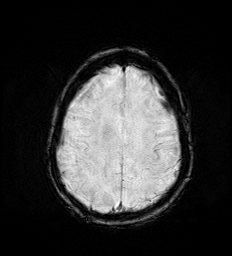
[im 60/60]
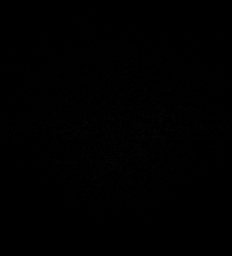

[Series 15: FLAIR · axial · 3.0mm · 0.53mm/px · z∈[-116,+43]mm · 3 of 55 slices shown]
[im 1/55]
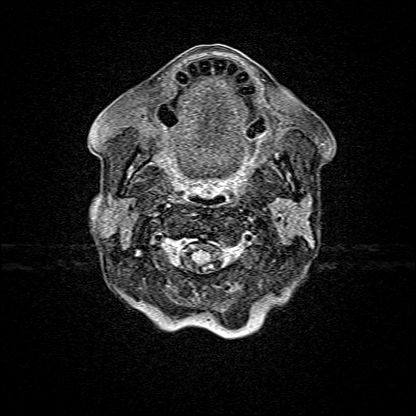
[im 28/55]
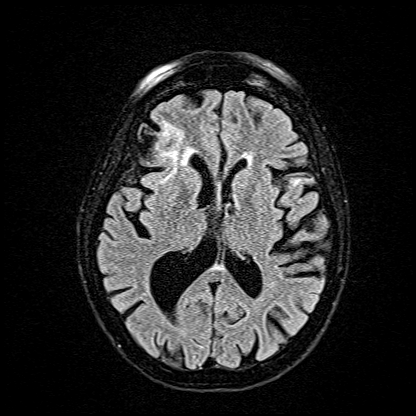
[im 55/55]
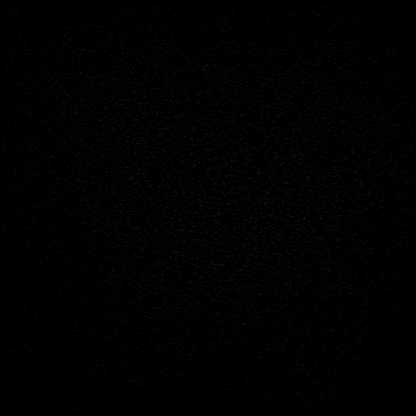

[Series 16: T1 · axial · 1.0mm · 0.98mm/px · z∈[-125,+47]mm · 9 of 173 slices shown (2 of 2)]
[im 1/173]
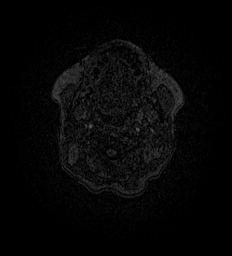
[im 20/173]
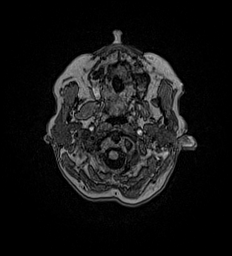
[im 39/173]
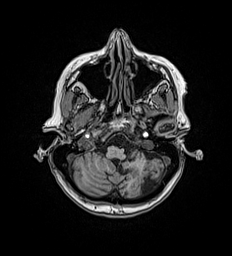
[im 58/173]
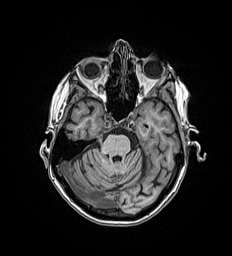
[im 77/173]
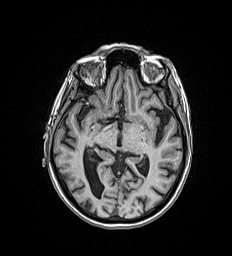
[im 96/173]
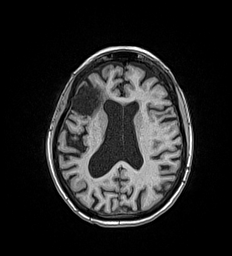
[im 115/173]
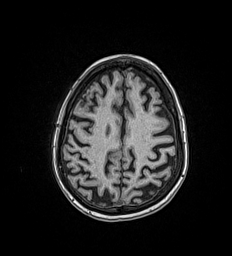
[im 153/173]
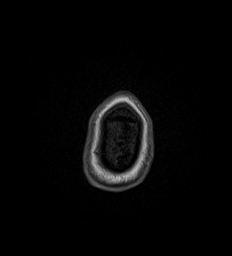
[im 173/173]
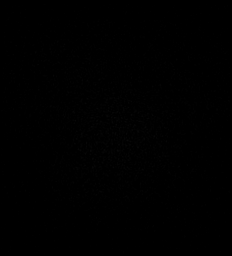

[Series 17: T2 post-contrast · coronal · 5.0mm · 0.57mm/px · 2 of 29 slices shown]
[im 1/29]
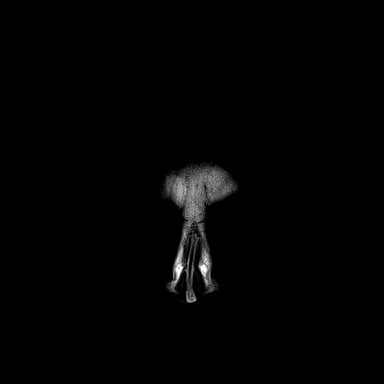
[im 29/29]
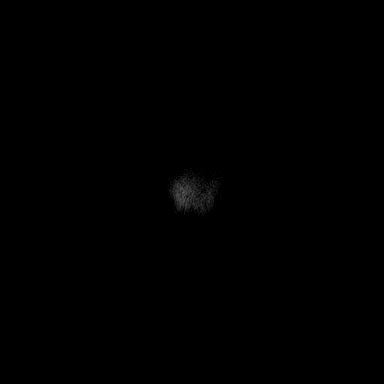

[Series 18: T1 post-contrast · axial · 1.0mm · 0.98mm/px · z∈[-125,+47]mm · 10 of 174 slices shown (1 of 3)]
[im 1/174]
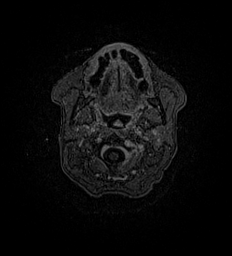
[im 20/174]
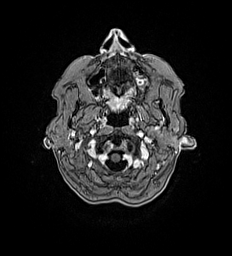
[im 39/174]
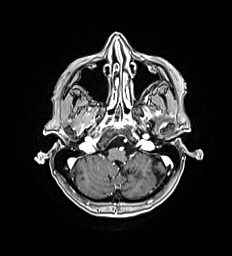
[im 58/174]
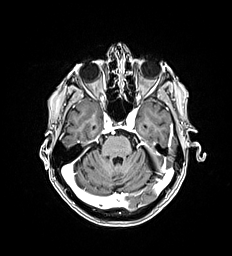
[im 77/174]
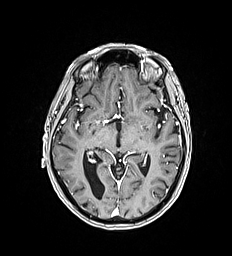
[im 97/174]
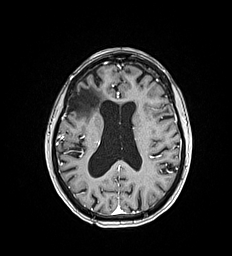
[im 116/174]
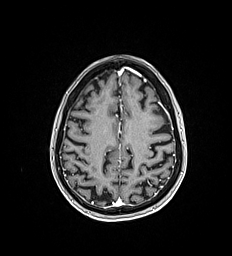
[im 135/174]
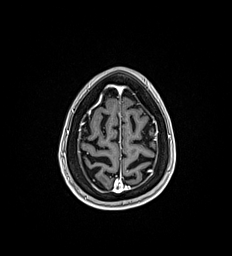
[im 154/174]
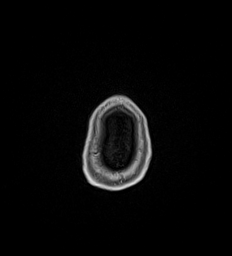
[im 174/174]
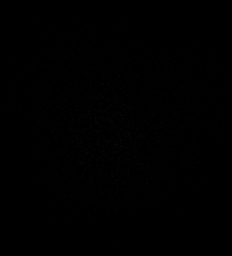

[Series 19: T1 post-contrast · coronal · 5.0mm · 0.57mm/px · 2 of 29 slices shown (2 of 3)]
[im 1/29]
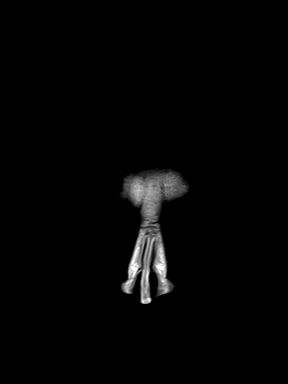
[im 29/29]
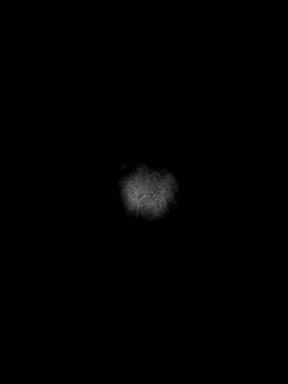

[Series 20: T1 post-contrast · sagittal · 5.0mm · 0.62mm/px · 1 of 21 slices shown (3 of 3)]
[im 1/21]
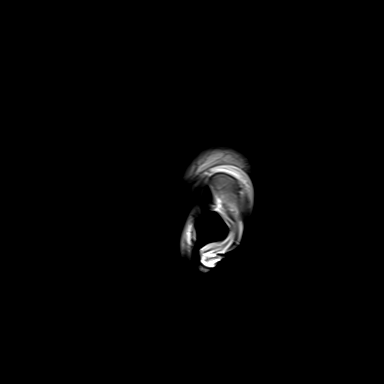

[47 of 48 positions shown; findings below may reference images not displayed]

FINDINGS: Brain: Postcontrast images demonstrate no pathologic enhancement.

A punctate focus of cortical restricted diffusion is present in the
posterior right frontal lobe on image 38 of series 5. Focal T2
signal is associated.

Encephalomalacia involving the anterior right frontal lobe and right
occipital pole is most consistent with remote infarcts. Remote
infarcts are present in the cerebellum bilaterally, most
significantly in the most inferior left cerebellum. Multiple lacunar
infarcts are present in both cerebellar hemispheres. Brainstem is
unremarkable.

Volume loss associated with the right-sided infarcts is cyst seated
with ex vacuo dilation of the right lateral ventricle. Smaller
remote left frontal lobe infarct involves the operculum.

No significant extra-axial fluid collection is present. The internal
auditory canals are within normal limits.

Vascular: Flow is present in the major intracranial arteries.

Skull and upper cervical spine: The craniocervical junction is
normal. Upper cervical spine is within normal limits. Marrow signal
is unremarkable.

Sinuses/Orbits: Mild mucosal thickening is present in the left
maxillary sinus The paranasal sinuses and mastoid air cells are
otherwise clear. Bilateral lens replacements are noted. Globes and
orbits are otherwise unremarkable.
IMPRESSION: 1. No evidence for metastatic disease to the brain.
2. Punctate focus of cortical restricted diffusion in the posterior
right frontal lobe compatible with a small acute/subacute infarct.
3. Remote infarcts involving the anterior right frontal lobe and
right occipital pole.
4. Remote infarcts involving the cerebellum bilaterally.
5. Mild left maxillary sinus disease.
